# Patient Record
Sex: Female | Born: 1980 | Race: Black or African American | Hispanic: No | Marital: Single | Smoking: Never smoker
Health system: Southern US, Community
[De-identification: ages and names within clinical notes are randomized; demographics above are authoritative.]

## PROBLEM LIST (undated history)

## (undated) ENCOUNTER — Inpatient Hospital Stay (HOSPITAL_COMMUNITY): Payer: Self-pay

## (undated) DIAGNOSIS — D649 Anemia, unspecified: Secondary | ICD-10-CM

## (undated) DIAGNOSIS — IMO0002 Reserved for concepts with insufficient information to code with codable children: Secondary | ICD-10-CM

## (undated) DIAGNOSIS — I10 Essential (primary) hypertension: Secondary | ICD-10-CM

## (undated) DIAGNOSIS — R87629 Unspecified abnormal cytological findings in specimens from vagina: Secondary | ICD-10-CM

## (undated) DIAGNOSIS — O139 Gestational [pregnancy-induced] hypertension without significant proteinuria, unspecified trimester: Secondary | ICD-10-CM

## (undated) DIAGNOSIS — R87619 Unspecified abnormal cytological findings in specimens from cervix uteri: Secondary | ICD-10-CM

## (undated) DIAGNOSIS — Z9289 Personal history of other medical treatment: Secondary | ICD-10-CM

## (undated) DIAGNOSIS — R51 Headache: Secondary | ICD-10-CM

## (undated) DIAGNOSIS — O210 Mild hyperemesis gravidarum: Secondary | ICD-10-CM

## (undated) HISTORY — PX: INDUCED ABORTION: SHX677

## (undated) HISTORY — PX: NO PAST SURGERIES: SHX2092

---
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
## 1999-10-26 ENCOUNTER — Emergency Department (HOSPITAL_COMMUNITY): Admission: EM | Admit: 1999-10-26 | Discharge: 1999-10-26 | Payer: Self-pay | Admitting: Internal Medicine

## 1999-12-24 ENCOUNTER — Emergency Department (HOSPITAL_COMMUNITY): Admission: EM | Admit: 1999-12-24 | Discharge: 1999-12-25 | Payer: Self-pay | Admitting: Emergency Medicine

## 2000-02-10 ENCOUNTER — Other Ambulatory Visit: Admission: RE | Admit: 2000-02-10 | Discharge: 2000-02-10 | Payer: Self-pay | Admitting: Obstetrics

## 2000-03-21 ENCOUNTER — Emergency Department (HOSPITAL_COMMUNITY): Admission: EM | Admit: 2000-03-21 | Discharge: 2000-03-21 | Payer: Self-pay | Admitting: Emergency Medicine

## 2000-11-10 ENCOUNTER — Inpatient Hospital Stay (HOSPITAL_COMMUNITY): Admission: AD | Admit: 2000-11-10 | Discharge: 2000-11-10 | Payer: Self-pay | Admitting: Obstetrics

## 2000-12-05 ENCOUNTER — Emergency Department (HOSPITAL_COMMUNITY): Admission: EM | Admit: 2000-12-05 | Discharge: 2000-12-05 | Payer: Self-pay | Admitting: Emergency Medicine

## 2001-03-14 ENCOUNTER — Emergency Department (HOSPITAL_COMMUNITY): Admission: EM | Admit: 2001-03-14 | Discharge: 2001-03-14 | Payer: Self-pay | Admitting: Emergency Medicine

## 2001-11-22 ENCOUNTER — Emergency Department (HOSPITAL_COMMUNITY): Admission: EM | Admit: 2001-11-22 | Discharge: 2001-11-22 | Payer: Self-pay | Admitting: Emergency Medicine

## 2002-02-18 ENCOUNTER — Emergency Department (HOSPITAL_COMMUNITY): Admission: EM | Admit: 2002-02-18 | Discharge: 2002-02-18 | Payer: Self-pay | Admitting: Emergency Medicine

## 2003-01-26 ENCOUNTER — Emergency Department (HOSPITAL_COMMUNITY): Admission: EM | Admit: 2003-01-26 | Discharge: 2003-01-26 | Payer: Self-pay | Admitting: Emergency Medicine

## 2003-01-26 ENCOUNTER — Encounter: Payer: Self-pay | Admitting: Emergency Medicine

## 2003-03-23 ENCOUNTER — Emergency Department (HOSPITAL_COMMUNITY): Admission: EM | Admit: 2003-03-23 | Discharge: 2003-03-23 | Payer: Self-pay

## 2003-07-08 ENCOUNTER — Inpatient Hospital Stay (HOSPITAL_COMMUNITY): Admission: AD | Admit: 2003-07-08 | Discharge: 2003-07-08 | Payer: Self-pay | Admitting: Obstetrics

## 2003-10-14 ENCOUNTER — Emergency Department (HOSPITAL_COMMUNITY): Admission: EM | Admit: 2003-10-14 | Discharge: 2003-10-14 | Payer: Self-pay

## 2004-01-13 ENCOUNTER — Emergency Department (HOSPITAL_COMMUNITY): Admission: EM | Admit: 2004-01-13 | Discharge: 2004-01-13 | Payer: Self-pay | Admitting: Emergency Medicine

## 2005-12-10 ENCOUNTER — Inpatient Hospital Stay (HOSPITAL_COMMUNITY): Admission: AD | Admit: 2005-12-10 | Discharge: 2005-12-10 | Payer: Self-pay | Admitting: Obstetrics

## 2006-01-10 ENCOUNTER — Inpatient Hospital Stay (HOSPITAL_COMMUNITY): Admission: AD | Admit: 2006-01-10 | Discharge: 2006-01-11 | Payer: Self-pay | Admitting: Obstetrics

## 2006-03-03 ENCOUNTER — Inpatient Hospital Stay (HOSPITAL_COMMUNITY): Admission: AD | Admit: 2006-03-03 | Discharge: 2006-03-03 | Payer: Self-pay | Admitting: Obstetrics

## 2006-04-11 ENCOUNTER — Inpatient Hospital Stay (HOSPITAL_COMMUNITY): Admission: AD | Admit: 2006-04-11 | Discharge: 2006-04-11 | Payer: Self-pay | Admitting: Obstetrics

## 2006-06-16 ENCOUNTER — Inpatient Hospital Stay (HOSPITAL_COMMUNITY): Admission: AD | Admit: 2006-06-16 | Discharge: 2006-06-16 | Payer: Self-pay | Admitting: Obstetrics

## 2006-07-16 ENCOUNTER — Inpatient Hospital Stay (HOSPITAL_COMMUNITY): Admission: AD | Admit: 2006-07-16 | Discharge: 2006-07-16 | Payer: Self-pay | Admitting: Obstetrics

## 2006-07-18 ENCOUNTER — Inpatient Hospital Stay (HOSPITAL_COMMUNITY): Admission: AD | Admit: 2006-07-18 | Discharge: 2006-07-18 | Payer: Self-pay | Admitting: Obstetrics

## 2006-07-20 ENCOUNTER — Inpatient Hospital Stay (HOSPITAL_COMMUNITY): Admission: AD | Admit: 2006-07-20 | Discharge: 2006-07-20 | Payer: Self-pay | Admitting: Obstetrics

## 2006-07-22 ENCOUNTER — Inpatient Hospital Stay (HOSPITAL_COMMUNITY): Admission: AD | Admit: 2006-07-22 | Discharge: 2006-07-22 | Payer: Self-pay | Admitting: Obstetrics

## 2006-07-23 ENCOUNTER — Inpatient Hospital Stay (HOSPITAL_COMMUNITY): Admission: AD | Admit: 2006-07-23 | Discharge: 2006-07-23 | Payer: Self-pay | Admitting: Obstetrics

## 2006-07-24 ENCOUNTER — Inpatient Hospital Stay (HOSPITAL_COMMUNITY): Admission: AD | Admit: 2006-07-24 | Discharge: 2006-07-26 | Payer: Self-pay | Admitting: Obstetrics

## 2007-04-29 ENCOUNTER — Inpatient Hospital Stay (HOSPITAL_COMMUNITY): Admission: AD | Admit: 2007-04-29 | Discharge: 2007-04-29 | Payer: Self-pay | Admitting: Obstetrics

## 2007-08-20 ENCOUNTER — Inpatient Hospital Stay (HOSPITAL_COMMUNITY): Admission: AD | Admit: 2007-08-20 | Discharge: 2007-08-20 | Payer: Self-pay | Admitting: Obstetrics and Gynecology

## 2010-09-29 ENCOUNTER — Emergency Department (HOSPITAL_COMMUNITY)
Admission: EM | Admit: 2010-09-29 | Discharge: 2010-09-29 | Payer: Self-pay | Source: Home / Self Care | Admitting: Family Medicine

## 2011-02-25 ENCOUNTER — Inpatient Hospital Stay (HOSPITAL_COMMUNITY): Payer: Self-pay

## 2011-02-25 ENCOUNTER — Encounter (HOSPITAL_COMMUNITY): Payer: Self-pay | Admitting: Radiology

## 2011-02-25 ENCOUNTER — Inpatient Hospital Stay (HOSPITAL_COMMUNITY)
Admission: EM | Admit: 2011-02-25 | Discharge: 2011-02-25 | Disposition: A | Discharge status: Home / Self Care | Payer: Self-pay | Source: Ambulatory Visit | Attending: Obstetrics & Gynecology | Admitting: Obstetrics & Gynecology

## 2011-02-25 DIAGNOSIS — R109 Unspecified abdominal pain: Secondary | ICD-10-CM

## 2011-02-25 DIAGNOSIS — A5901 Trichomonal vulvovaginitis: Secondary | ICD-10-CM

## 2011-02-25 DIAGNOSIS — O98819 Other maternal infectious and parasitic diseases complicating pregnancy, unspecified trimester: Secondary | ICD-10-CM

## 2011-02-25 LAB — URINE MICROSCOPIC-ADD ON

## 2011-02-25 LAB — URINALYSIS, ROUTINE W REFLEX MICROSCOPIC
Bilirubin Urine: NEGATIVE
Glucose, UA: NEGATIVE mg/dL
Nitrite: NEGATIVE
Specific Gravity, Urine: 1.015 (ref 1.005–1.030)
pH: 8.5 — ABNORMAL HIGH (ref 5.0–8.0)

## 2011-02-25 LAB — WET PREP, GENITAL

## 2011-02-25 LAB — POCT PREGNANCY, URINE: Preg Test, Ur: POSITIVE

## 2011-04-07 ENCOUNTER — Inpatient Hospital Stay (HOSPITAL_COMMUNITY)
Admission: AD | Admit: 2011-04-07 | Discharge: 2011-04-07 | Disposition: A | Discharge status: Home / Self Care | Payer: Self-pay | Source: Ambulatory Visit | Attending: Obstetrics & Gynecology | Admitting: Obstetrics & Gynecology

## 2011-04-07 ENCOUNTER — Inpatient Hospital Stay (HOSPITAL_COMMUNITY): Admission: AD | Admit: 2011-04-07 | Payer: Self-pay | Source: Ambulatory Visit | Admitting: Obstetrics & Gynecology

## 2011-04-07 ENCOUNTER — Inpatient Hospital Stay (HOSPITAL_COMMUNITY): Payer: Self-pay

## 2011-04-07 DIAGNOSIS — A5901 Trichomonal vulvovaginitis: Secondary | ICD-10-CM | POA: Insufficient documentation

## 2011-04-07 DIAGNOSIS — R109 Unspecified abdominal pain: Secondary | ICD-10-CM

## 2011-04-07 LAB — WET PREP, GENITAL: Yeast Wet Prep HPF POC: NONE SEEN

## 2011-04-07 LAB — URINE MICROSCOPIC-ADD ON

## 2011-04-07 LAB — CBC
Hemoglobin: 12.2 g/dL (ref 12.0–15.0)
MCH: 26.9 pg (ref 26.0–34.0)
MCV: 82.3 fL (ref 78.0–100.0)
RBC: 4.53 MIL/uL (ref 3.87–5.11)

## 2011-04-07 LAB — URINALYSIS, ROUTINE W REFLEX MICROSCOPIC
Bilirubin Urine: NEGATIVE
Glucose, UA: NEGATIVE mg/dL
Specific Gravity, Urine: >1.03 — ABNORMAL HIGH (ref 1.005–1.030)
pH: 5.5 (ref 5.0–8.0)

## 2011-04-09 LAB — URINE CULTURE: Colony Count: 2000

## 2011-04-11 LAB — GC/CHLAMYDIA PROBE AMP, GENITAL
Chlamydia, DNA Probe: NEGATIVE
GC Probe Amp, Genital: NEGATIVE

## 2011-07-12 LAB — WET PREP, GENITAL

## 2011-07-12 LAB — URINALYSIS, ROUTINE W REFLEX MICROSCOPIC
Glucose, UA: NEGATIVE
Hgb urine dipstick: NEGATIVE
Protein, ur: NEGATIVE

## 2011-07-12 LAB — GC/CHLAMYDIA PROBE AMP, GENITAL: GC Probe Amp, Genital: NEGATIVE

## 2011-07-18 LAB — URINALYSIS, ROUTINE W REFLEX MICROSCOPIC
Bilirubin Urine: NEGATIVE
Hgb urine dipstick: NEGATIVE
Ketones, ur: NEGATIVE
Protein, ur: NEGATIVE
Urobilinogen, UA: 0.2

## 2011-07-18 LAB — POCT PREGNANCY, URINE: Preg Test, Ur: NEGATIVE

## 2011-07-18 LAB — WET PREP, GENITAL
Clue Cells Wet Prep HPF POC: NONE SEEN
Trich, Wet Prep: NONE SEEN
Yeast Wet Prep HPF POC: NONE SEEN

## 2011-09-29 ENCOUNTER — Emergency Department (HOSPITAL_COMMUNITY)
Admission: EM | Admit: 2011-09-29 | Discharge: 2011-09-29 | Disposition: A | Discharge status: Home / Self Care | Payer: Self-pay | Source: Home / Self Care | Attending: Family Medicine | Admitting: Family Medicine

## 2011-09-29 ENCOUNTER — Encounter (HOSPITAL_COMMUNITY): Payer: Self-pay | Admitting: *Deleted

## 2011-09-29 DIAGNOSIS — J020 Streptococcal pharyngitis: Secondary | ICD-10-CM

## 2011-09-29 LAB — POCT RAPID STREP A: Streptococcus, Group A Screen (Direct): POSITIVE — AB

## 2011-09-29 MED ORDER — CEFDINIR 300 MG PO CAPS: 300.0000 mg | ORAL_CAPSULE | Freq: Two times a day (BID) | ORAL | Status: AC
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
## 2011-09-29 NOTE — ED Provider Notes (Signed)
History     CSN: [id number]  Arrival date & time 09/29/11  1191   First MD Initiated Contact with Patient 09/29/11 1820      Chief Complaint  Patient presents with  . Sore Throat    (Consider location/radiation/quality/duration/timing/severity/associated sxs/prior treatment) Patient is a 30 y.o. female presenting with pharyngitis. The history is provided by the patient.  Sore Throat This is a new problem. The current episode started yesterday. The problem occurs constantly. The problem has been gradually worsening. Pertinent negatives include no chest pain, no abdominal pain, no headaches and no shortness of breath. The symptoms are aggravated by nothing. The symptoms are relieved by nothing.    History reviewed. No pertinent past medical history.  History reviewed. No pertinent past surgical history.  History reviewed. No pertinent family history.  History  Substance Use Topics  . Smoking status: Never Smoker   . Smokeless tobacco: Not on file  . Alcohol Use: Yes    OB History    Grav Para Term Preterm Abortions TAB SAB Ect Mult Living   1               Review of Systems  Constitutional: Positive for fever and chills.  HENT: Positive for sore throat.   Respiratory: Negative for shortness of breath.   Cardiovascular: Negative for chest pain.  Gastrointestinal: Negative for abdominal pain.  Skin: Negative.   Neurological: Negative for headaches.    Allergies  Review of patient's allergies indicates no known allergies.  Home Medications   Current Outpatient Rx  Name Route Sig Dispense Refill  . NYQUIL PO Oral Take by mouth.      . CEFDINIR 300 MG PO CAPS Oral Take 1 capsule (300 mg total) by mouth 2 (two) times daily. 20 capsule 0    BP 132/80  Pulse 82  Temp(Src) 99.2 F (37.3 C) (Oral)  Resp 16  SpO2 100%  LMP 09/29/2011  Breastfeeding? Unknown  Physical Exam  Constitutional: She appears well-developed and well-nourished.  HENT:  Head:  Normocephalic.  Right Ear: External ear normal.  Left Ear: External ear normal.  Mouth/Throat: Oropharyngeal exudate and posterior oropharyngeal erythema present.  Eyes: Pupils are equal, round, and reactive to light.  Neck: Normal range of motion. Neck supple.  Lymphadenopathy:    She has cervical adenopathy.    ED Course  Procedures (including critical care time)  Labs Reviewed  POCT RAPID STREP A ([facility] ONLY) - Abnormal; Notable for the following:    Streptococcus, Group A Screen (Direct) POSITIVE (*)    All other components within normal limits   No results found.   1. Streptococcal sore throat       MDM  Strep pos.        Barkley Bruns, MD 09/29/11 2030

## 2012-07-10 ENCOUNTER — Encounter (HOSPITAL_COMMUNITY): Payer: Self-pay | Admitting: Emergency Medicine

## 2012-07-10 ENCOUNTER — Emergency Department (HOSPITAL_COMMUNITY)
Admission: EM | Admit: 2012-07-10 | Discharge: 2012-07-10 | Disposition: A | Discharge status: Home / Self Care | Payer: No Typology Code available for payment source | Attending: Emergency Medicine | Admitting: Emergency Medicine

## 2012-07-10 DIAGNOSIS — Z91040 Latex allergy status: Secondary | ICD-10-CM | POA: Insufficient documentation

## 2012-07-10 DIAGNOSIS — I10 Essential (primary) hypertension: Secondary | ICD-10-CM | POA: Insufficient documentation

## 2012-07-10 DIAGNOSIS — Y92410 Unspecified street and highway as the place of occurrence of the external cause: Secondary | ICD-10-CM | POA: Insufficient documentation

## 2012-07-10 DIAGNOSIS — S139XXA Sprain of joints and ligaments of unspecified parts of neck, initial encounter: Secondary | ICD-10-CM | POA: Insufficient documentation

## 2012-07-10 DIAGNOSIS — S134XXA Sprain of ligaments of cervical spine, initial encounter: Secondary | ICD-10-CM

## 2012-07-10 MED ORDER — IBUPROFEN 800 MG PO TABS: 800.0000 mg | ORAL_TABLET | Freq: Three times a day (TID) | ORAL | Status: DC
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
## 2012-07-11 NOTE — ED Provider Notes (Signed)
Medical screening examination/treatment/procedure(s) were performed by non-physician practitioner and as supervising physician I was immediately available for consultation/collaboration.  Jones Skene, M.D.     Jones Skene, MD 07/11/12 1350

## 2012-09-12 ENCOUNTER — Inpatient Hospital Stay (HOSPITAL_COMMUNITY): Payer: Self-pay

## 2012-09-12 ENCOUNTER — Inpatient Hospital Stay (HOSPITAL_COMMUNITY)
Admission: AD | Admit: 2012-09-12 | Discharge: 2012-09-12 | Disposition: A | Discharge status: Home / Self Care | Payer: Self-pay | Source: Ambulatory Visit | Attending: Obstetrics & Gynecology | Admitting: Obstetrics & Gynecology

## 2012-09-12 ENCOUNTER — Encounter (HOSPITAL_COMMUNITY): Payer: Self-pay | Admitting: *Deleted

## 2012-09-12 DIAGNOSIS — H6122 Impacted cerumen, left ear: Secondary | ICD-10-CM

## 2012-09-12 DIAGNOSIS — N912 Amenorrhea, unspecified: Secondary | ICD-10-CM | POA: Insufficient documentation

## 2012-09-12 DIAGNOSIS — Z3202 Encounter for pregnancy test, result negative: Secondary | ICD-10-CM | POA: Insufficient documentation

## 2012-09-12 DIAGNOSIS — N926 Irregular menstruation, unspecified: Secondary | ICD-10-CM

## 2012-09-12 DIAGNOSIS — R51 Headache: Secondary | ICD-10-CM | POA: Insufficient documentation

## 2012-09-12 DIAGNOSIS — R109 Unspecified abdominal pain: Secondary | ICD-10-CM | POA: Insufficient documentation

## 2012-09-12 HISTORY — DX: Headache: R51

## 2012-09-12 HISTORY — DX: Anemia, unspecified: D64.9

## 2012-09-12 HISTORY — DX: Gestational (pregnancy-induced) hypertension without significant proteinuria, unspecified trimester: O13.9

## 2012-09-12 LAB — URINALYSIS, ROUTINE W REFLEX MICROSCOPIC
Leukocytes, UA: NEGATIVE
Nitrite: NEGATIVE
Protein, ur: NEGATIVE mg/dL
Urobilinogen, UA: 0.2 mg/dL (ref 0.0–1.0)

## 2012-09-12 LAB — WET PREP, GENITAL
Trich, Wet Prep: NONE SEEN
Yeast Wet Prep HPF POC: NONE SEEN

## 2012-09-12 LAB — POCT PREGNANCY, URINE: Preg Test, Ur: NEGATIVE

## 2012-09-12 LAB — CBC
HCT: 39.8 % (ref 36.0–46.0)
MCH: 28.1 pg (ref 26.0–34.0)
MCHC: 33.4 g/dL (ref 30.0–36.0)
RDW: 13 % (ref 11.5–15.5)

## 2012-09-12 LAB — HCG, QUANTITATIVE, PREGNANCY: hCG, Beta Chain, Quant, S: <1 m[IU]/mL (ref <5)

## 2012-09-12 NOTE — MAU Note (Signed)
C/o nausea and feeling tired; had some ringing in her ear and dizziness also;

## 2012-09-12 NOTE — MAU Note (Signed)
Started a new sexual relationship about "8 months";

## 2012-09-12 NOTE — MAU Provider Note (Signed)
Attestation of Attending Supervision of Advanced Practitioner (PA/CNM/NP): Evaluation and management procedures were performed by the Advanced Practitioner under my supervision and collaboration.  I have reviewed the Advanced Practitioner's note and chart, and I agree with the management and plan.  Toa Mia, MD, FACOG Attending Obstetrician & Gynecologist Faculty Practice, [facility]  

## 2012-09-12 NOTE — MAU Provider Note (Signed)
History     CSN: [id number]  Arrival date and time: 09/12/12 0907   First Provider Initiated Contact with Patient 09/12/12 [phone]      Chief Complaint  Patient presents with  . Abdominal Pain   HPI Kathy Dean 31 y.o. Comes to MAU with a number of complaints - mainly lower abdominal pain intermittently x 2 months, missed menstrual cycle, headaches, nausea, dizziness, ringing in her ears and sleeping a lot.  Denies nasal and head congestion.  Has not taken any pain medication for her headaches or abdominal pain.  States she does not take medication.  Menstrual cramps are ususally 10/10 and she does not take medication.  Her abdominal pain in the past 2 months has been 7/10 when it occurs.  Using condoms for contraception  - inconsistent with use.  Currently has a headache today - rates it at 4/10.  No worse than usual.  Declines any pain medication for headache.  Reports she drinks lots of water daily.  OB History    Grav Para Term Preterm Abortions TAB SAB Ect Mult Living   6 3  3 2 2    3       Past Medical History  Diagnosis Date  . Hypertension   . Anemia   . Pregnancy induced hypertension   . Headache     History reviewed. No pertinent past surgical history.  Family History  Problem Relation Age of Onset  . Other Neg Hx     History  Substance Use Topics  . Smoking status: Never Smoker   . Smokeless tobacco: Never Used  . Alcohol Use: Yes     Comment: socially    Allergies:  Allergies  Allergen Reactions  . Latex Swelling and Dermatitis    No prescriptions prior to admission    Review of Systems  Constitutional: Negative for fever.  HENT: Negative for ear pain, congestion and ear discharge.        Ringing in ears.  Respiratory: Negative for cough.   Gastrointestinal: Positive for abdominal pain. Negative for nausea, vomiting, diarrhea and constipation.       C/O RLQ tenderness  Genitourinary:       No vaginal discharge. No vaginal bleeding. No  dysuria.  Neurological: Positive for dizziness and headaches.   Physical Exam   Blood pressure 136/82, pulse 79, temperature 97.8 F (36.6 C), temperature source Oral, resp. rate 16, height 5\' 2"  (1.575 m), weight 54.432 kg (120 lb), last menstrual period 08/02/2012.  Physical Exam  Nursing note and vitals reviewed. Constitutional: She is oriented to person, place, and time. She appears well-developed and well-nourished.  HENT:  Head: Normocephalic.       Ears - TM on right  - good light reflex, no redness, no fullness, TM on left not visualized as ear canal is completely blocked with dark wax.  Eyes: EOM are normal.  Neck: Neck supple.  GI: Soft. There is no tenderness.  Genitourinary:       Speculum exam: Vagina - Small amount of creamy discharge, no odor, minimal brown mucus coming from cervix, small fissure noted at 6 oclock on introitus (states she felt a tear last night during sex) Cervix - No active bleeding Bimanual exam: Cervix closed Uterus non tender, normal size Adnexa non tender, no masses bilaterally GC/Chlam, wet prep done Chaperone present for exam.  Musculoskeletal: Normal range of motion.  Neurological: She is alert and oriented to person, place, and time.  Skin: Skin is warm and  dry.  Psychiatric: She has a normal mood and affect.    MAU Course  Procedures Results for orders placed during the hospital encounter of 09/12/12 (from the past 24 hour(s))  URINALYSIS, ROUTINE W REFLEX MICROSCOPIC     Status: Abnormal   Collection Time   09/12/12  9:14 AM      Component Value Range   Color, Urine YELLOW  YELLOW   APPearance CLEAR  CLEAR   Specific Gravity, Urine >1.030 (*) 1.005 - 1.030   pH 6.0  5.0 - 8.0   Glucose, UA NEGATIVE  NEGATIVE mg/dL   Hgb urine dipstick TRACE (*) NEGATIVE   Bilirubin Urine NEGATIVE  NEGATIVE   Ketones, ur NEGATIVE  NEGATIVE mg/dL   Protein, ur NEGATIVE  NEGATIVE mg/dL   Urobilinogen, UA 0.2  0.0 - 1.0 mg/dL   Nitrite NEGATIVE   NEGATIVE   Leukocytes, UA NEGATIVE  NEGATIVE  URINE MICROSCOPIC-ADD ON     Status: Abnormal   Collection Time   09/12/12  9:14 AM      Component Value Range   Squamous Epithelial / LPF FEW (*) RARE   WBC, UA 0-2  <3 WBC/hpf   RBC / HPF 3-6  <3 RBC/hpf   Urine-Other MUCOUS PRESENT    POCT PREGNANCY, URINE     Status: Normal   Collection Time   09/12/12  9:23 AM      Component Value Range   Preg Test, Ur NEGATIVE  NEGATIVE  HCG, QUANTITATIVE, PREGNANCY     Status: Normal   Collection Time   09/12/12  9:55 AM      Component Value Range   hCG, Beta Chain, Quant, S <1  <5 mIU/mL  CBC     Status: Normal   Collection Time   09/12/12  9:55 AM      Component Value Range   WBC 4.8  4.0 - 10.5 K/uL   RBC 4.74  3.87 - 5.11 MIL/uL   Hemoglobin 13.3  12.0 - 15.0 g/dL   HCT 09.8  11.9 - 14.7 %   MCV 84.0  78.0 - 100.0 fL   MCH 28.1  26.0 - 34.0 pg   MCHC 33.4  30.0 - 36.0 g/dL   RDW 82.9  56.2 - 13.0 %   Platelets 293  150 - 400 K/uL  WET PREP, GENITAL     Status: Abnormal   Collection Time   09/12/12 10:15 AM      Component Value Range   Yeast Wet Prep HPF POC NONE SEEN  NONE SEEN   Trich, Wet Prep NONE SEEN  NONE SEEN   Clue Cells Wet Prep HPF POC FEW (*) NONE SEEN   WBC, Wet Prep HPF POC MANY (*) NONE SEEN    MDM Clinical Data: Right lower quadrant pain. LMP 08/02/2012.  TRANSABDOMINAL AND TRANSVAGINAL ULTRASOUND OF PELVIS  Technique: Both transabdominal and transvaginal ultrasound  examinations of the pelvis were performed. Transabdominal  technique was performed for global imaging of the pelvis including  uterus, ovaries, adnexal regions, and pelvic cul-de-sac.  It was necessary to proceed with endovaginal exam following the  transabdominal exam to visualize the endometrium and ovaries.  Comparison: 04/07/2011  Findings:  Uterus: 10.1 x 5.7 x 7.1 cm. Retroverted. No fibroids or other  uterine mass identified.  Endometrium: Double layer thickness measures 15 mm  transvaginally.  No focal lesion visualized.  Right ovary: 2.3 x 1.5 x 2.1 cm. Normal appearance.  Left ovary: 3.9 x 2.8 x 2.8  cm. Normal appearance. 2.7 cm  follicle noted.  Other Findings: No free fluid  IMPRESSION:  Normal study. No evidence of pelvic mass or other significant  abnormality.    Assessment and Plan  Negative pregnancy test Abdominal pain  Missed menstrual cycle - unknown cause Vaginal fissure  Plan Drink at least 8 8-oz glasses of water every day. Establish with a doctor to be seen for regular care and evaluation of continuing problems. Use Debrox drops in left ear canal to clear accumulation of ear wax. Use water soluble lubricant with intercourse to avoid any vaginal tears.  No sex until the current area heals. Your blood and urine pregnancy tests are negative today.  Continue using condoms for pregnancy protection.  BURLESON,TERRI 09/12/2012, 10:20 AM

## 2012-09-12 NOTE — MAU Note (Signed)
Missed a period; c/o intermittent abdominal pain for past month;

## 2012-09-13 LAB — GC/CHLAMYDIA PROBE AMP: GC Probe RNA: NEGATIVE

## 2013-07-18 ENCOUNTER — Emergency Department (HOSPITAL_COMMUNITY)
Admission: EM | Admit: 2013-07-18 | Discharge: 2013-07-18 | Disposition: A | Discharge status: Home / Self Care | Payer: Self-pay | Attending: Emergency Medicine | Admitting: Emergency Medicine

## 2013-07-18 ENCOUNTER — Encounter (HOSPITAL_COMMUNITY): Payer: Self-pay | Admitting: Emergency Medicine

## 2013-07-18 DIAGNOSIS — S0993XA Unspecified injury of face, initial encounter: Secondary | ICD-10-CM | POA: Insufficient documentation

## 2013-07-18 DIAGNOSIS — S8990XA Unspecified injury of unspecified lower leg, initial encounter: Secondary | ICD-10-CM | POA: Insufficient documentation

## 2013-07-18 DIAGNOSIS — IMO0002 Reserved for concepts with insufficient information to code with codable children: Secondary | ICD-10-CM | POA: Insufficient documentation

## 2013-07-18 DIAGNOSIS — Y9389 Activity, other specified: Secondary | ICD-10-CM | POA: Insufficient documentation

## 2013-07-18 DIAGNOSIS — Z862 Personal history of diseases of the blood and blood-forming organs and certain disorders involving the immune mechanism: Secondary | ICD-10-CM | POA: Insufficient documentation

## 2013-07-18 DIAGNOSIS — Z91040 Latex allergy status: Secondary | ICD-10-CM | POA: Insufficient documentation

## 2013-07-18 DIAGNOSIS — Y92410 Unspecified street and highway as the place of occurrence of the external cause: Secondary | ICD-10-CM | POA: Insufficient documentation

## 2013-07-18 MED ORDER — CYCLOBENZAPRINE HCL 10 MG PO TABS
10.0000 mg | ORAL_TABLET | Freq: Once | ORAL | Status: AC
  Administered 2013-07-18: 10 mg via ORAL
  Filled 2013-07-18: qty 1

## 2013-07-18 MED ORDER — IBUPROFEN 400 MG PO TABS
600.0000 mg | ORAL_TABLET | Freq: Once | ORAL | Status: AC
Start: 2013-07-18 — End: 2013-07-18
  Administered 2013-07-18: 600 mg via ORAL
  Filled 2013-07-18 (×2): qty 1

## 2013-07-18 MED ORDER — IBUPROFEN 600 MG PO TABS: 600.0000 mg | ORAL_TABLET | Freq: Four times a day (QID) | ORAL | Status: DC | PRN

## 2013-07-18 MED ORDER — CYCLOBENZAPRINE HCL 10 MG PO TABS: 10.0000 mg | ORAL_TABLET | Freq: Two times a day (BID) | ORAL | Status: DC | PRN
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
## 2013-07-23 NOTE — ED Provider Notes (Signed)
Medical screening examination/treatment/procedure(s) were performed by non-physician practitioner and as supervising physician I was immediately available for consultation/collaboration.  Everlene Cunning Y. Tyquisha Sharps, MD 07/23/13 0043 

## 2013-08-24 ENCOUNTER — Emergency Department (HOSPITAL_COMMUNITY)
Admission: EM | Admit: 2013-08-24 | Discharge: 2013-08-24 | Disposition: A | Discharge status: Home / Self Care | Payer: Medicaid Other | Attending: Emergency Medicine | Admitting: Emergency Medicine

## 2013-08-24 ENCOUNTER — Emergency Department (HOSPITAL_COMMUNITY): Payer: Medicaid Other

## 2013-08-24 ENCOUNTER — Encounter (HOSPITAL_COMMUNITY): Payer: Self-pay | Admitting: Emergency Medicine

## 2013-08-24 DIAGNOSIS — O169 Unspecified maternal hypertension, unspecified trimester: Secondary | ICD-10-CM | POA: Insufficient documentation

## 2013-08-24 DIAGNOSIS — R1031 Right lower quadrant pain: Secondary | ICD-10-CM | POA: Insufficient documentation

## 2013-08-24 DIAGNOSIS — N898 Other specified noninflammatory disorders of vagina: Secondary | ICD-10-CM | POA: Insufficient documentation

## 2013-08-24 DIAGNOSIS — R1032 Left lower quadrant pain: Secondary | ICD-10-CM | POA: Insufficient documentation

## 2013-08-24 DIAGNOSIS — R1033 Periumbilical pain: Secondary | ICD-10-CM | POA: Insufficient documentation

## 2013-08-24 DIAGNOSIS — Z3490 Encounter for supervision of normal pregnancy, unspecified, unspecified trimester: Secondary | ICD-10-CM

## 2013-08-24 DIAGNOSIS — Z79899 Other long term (current) drug therapy: Secondary | ICD-10-CM | POA: Insufficient documentation

## 2013-08-24 DIAGNOSIS — M545 Low back pain, unspecified: Secondary | ICD-10-CM | POA: Insufficient documentation

## 2013-08-24 DIAGNOSIS — Z91040 Latex allergy status: Secondary | ICD-10-CM | POA: Insufficient documentation

## 2013-08-24 DIAGNOSIS — R110 Nausea: Secondary | ICD-10-CM | POA: Insufficient documentation

## 2013-08-24 DIAGNOSIS — Z862 Personal history of diseases of the blood and blood-forming organs and certain disorders involving the immune mechanism: Secondary | ICD-10-CM | POA: Insufficient documentation

## 2013-08-24 DIAGNOSIS — O9989 Other specified diseases and conditions complicating pregnancy, childbirth and the puerperium: Secondary | ICD-10-CM | POA: Insufficient documentation

## 2013-08-24 LAB — CBC WITH DIFFERENTIAL/PLATELET
Basophils Absolute: 0 10*3/uL (ref 0.0–0.1)
Eosinophils Absolute: 0.1 10*3/uL (ref 0.0–0.7)
Eosinophils Relative: 1 % (ref 0–5)
Lymphs Abs: 1.3 10*3/uL (ref 0.7–4.0)
MCH: 27.7 pg (ref 26.0–34.0)
Neutrophils Relative %: 74 % (ref 43–77)
Platelets: 293 10*3/uL (ref 150–400)
RBC: 4.59 MIL/uL (ref 3.87–5.11)
RDW: 13.6 % (ref 11.5–15.5)
WBC: 6.6 10*3/uL (ref 4.0–10.5)

## 2013-08-24 LAB — COMPREHENSIVE METABOLIC PANEL
ALT: 10 U/L (ref 0–35)
AST: 17 U/L (ref 0–37)
Albumin: 3.9 g/dL (ref 3.5–5.2)
Alkaline Phosphatase: 53 U/L (ref 39–117)
Calcium: 8.8 mg/dL (ref 8.4–10.5)
GFR calc Af Amer: >90 mL/min (ref >90)
Glucose, Bld: 76 mg/dL (ref 70–99)
Potassium: 3.6 mEq/L (ref 3.5–5.1)
Sodium: 138 mEq/L (ref 135–145)
Total Protein: 7 g/dL (ref 6.0–8.3)

## 2013-08-24 LAB — URINALYSIS, ROUTINE W REFLEX MICROSCOPIC
Bilirubin Urine: NEGATIVE
Glucose, UA: NEGATIVE mg/dL
Hgb urine dipstick: NEGATIVE
Specific Gravity, Urine: 1.029 (ref 1.005–1.030)
pH: 6.5 (ref 5.0–8.0)

## 2013-08-24 LAB — URINE MICROSCOPIC-ADD ON

## 2013-08-24 LAB — WET PREP, GENITAL
Trich, Wet Prep: NONE SEEN
Yeast Wet Prep HPF POC: NONE SEEN

## 2013-08-24 MED ORDER — SODIUM CHLORIDE 0.9 % IV BOLUS (SEPSIS)
1000.0000 mL | Freq: Once | INTRAVENOUS | Status: AC
  Administered 2013-08-24: 1000 mL via INTRAVENOUS

## 2013-08-24 MED ORDER — CEFTRIAXONE SODIUM 250 MG IJ SOLR
250.0000 mg | Freq: Once | INTRAMUSCULAR | Status: AC
  Administered 2013-08-24: 250 mg via INTRAMUSCULAR
  Filled 2013-08-24: qty 250

## 2013-08-24 MED ORDER — MORPHINE SULFATE 4 MG/ML IJ SOLN
4.0000 mg | Freq: Once | INTRAMUSCULAR | Status: AC
  Administered 2013-08-24: 4 mg via INTRAVENOUS
  Filled 2013-08-24: qty 1

## 2013-08-24 MED ORDER — AZITHROMYCIN 250 MG PO TABS
1000.0000 mg | ORAL_TABLET | Freq: Once | ORAL | Status: AC
  Administered 2013-08-24: 1000 mg via ORAL
  Filled 2013-08-24: qty 4

## 2013-08-24 MED ORDER — ONDANSETRON HCL 4 MG/2ML IJ SOLN
4.0000 mg | Freq: Once | INTRAMUSCULAR | Status: AC
  Administered 2013-08-24: 4 mg via INTRAVENOUS
  Filled 2013-08-24: qty 2

## 2013-08-24 NOTE — ED Notes (Signed)
PA and Tech at bedside to perform pelvic exam.

## 2013-08-24 NOTE — ED Provider Notes (Signed)
CSN: [id number]     Arrival date & time 08/24/13  3086 History   First MD Initiated Contact with Patient 08/24/13 [id number]     Chief Complaint  Patient presents with  . Abdominal Pain  . Back Pain   (Consider location/radiation/quality/duration/timing/severity/associated sxs/prior Treatment) HPI  32 year old female with history of hypertension and anemia presents complaining of abdominal pain and back pain. Patient reports having low trouble pain and low back pain ongoing for about 4 or 5 days. The pain is described as a sharp and throbbing sensation, worsening with movement, and sometimes improves with voiding. Pain in her back has been constant pain abdomen has been intermittent. This morning abdominal pain with severe waking her up in the middle of the night. She endorsed nausea without vomiting. Endorse loose stools without blood or mucus. She tries drinking fluid and rest with minimal relief. She is currently rating her pain as a 6/10. She reports having to do a lot of standing at work and I'm sure if this is related to it. She has decrease in appetite but continued to drink plenty of fluid. She denies fever, chills, headache, chest pain, shortness of breath, productive cough, burning urination, vaginal bleeding, hematuria, hematochezia or melena. She did report having some mild vaginal discharge which she thought it is the infection has been taking over-the-counter yeast medication with minimal relief. She denies any pain with sexual activity. No prior abdominal surgery.  Past Medical History  Diagnosis Date  . Hypertension   . Anemia   . Pregnancy induced hypertension   . Headache(784.0)    History reviewed. No pertinent past surgical history. Family History  Problem Relation Age of Onset  . Other Neg Hx    History  Substance Use Topics  . Smoking status: Never Smoker   . Smokeless tobacco: Never Used  . Alcohol Use: Yes     Comment: socially   OB History   Grav Para Term Preterm  Abortions TAB SAB Ect Mult Living   6 3  3 2 2    3      Review of Systems  All other systems reviewed and are negative.    Allergies  Latex  Home Medications   Current Outpatient Rx  Name  Route  Sig  Dispense  Refill  . PRESCRIPTION MEDICATION   Oral   Take 1 tablet by mouth daily. Birth control          LMP 07/26/2013 Physical Exam  Nursing note and vitals reviewed. Constitutional: She appears well-developed and well-nourished. No distress.  HENT:  Head: Normocephalic and atraumatic.  Eyes: Conjunctivae are normal.  Neck: Normal range of motion. Neck supple.  Cardiovascular: Normal rate and regular rhythm.   Pulmonary/Chest: Effort normal and breath sounds normal. She exhibits no tenderness.  Abdominal: Soft. There is no tenderness (Patient with periumbilical, left lower and right lower quadrant tenderness on palpation without guarding or rebound tenderness, no hernia noted, abdomen is otherwise soft with normal bowel sounds. No psoas or obturator sign, no Murphy sign ).  Genitourinary: Vagina normal and uterus normal. There is no rash or lesion on the right labia. There is no rash or lesion on the left labia. Cervix exhibits no motion tenderness and no discharge. Right adnexum displays tenderness. Right adnexum displays no mass. Left adnexum displays tenderness. Left adnexum displays no mass. No erythema, tenderness or bleeding around the vagina. No vaginal discharge found.  Chaperone present:  Lymphadenopathy:       Right: No inguinal adenopathy  present.       Left: No inguinal adenopathy present.    ED Course  Procedures (including critical care time)  6:28 AM Patient with low abnormal pain and low back pain. Workup initiated. Patient otherwise appearing to be nontoxic with stable normal vital sign.  7:34 AM Pregnancy test is positive. Patient does have left and right adnexa tenderness and mild cervical motion tenderness. She has some abnormal soap growths noted  on the cervical os at the 3 and 6:00 o'clock position. She does have prior history of STDs including gonorrhea Chlamydia. We'll give Rocephin and Zithromax here. Additional workup needed to rule out ectopic pregnancy.  9:44 AM Transvaginal US without evidence of intrauterine or extrauterine gestational sac.  Some findings to suggest a resolving corpus luteal cysts involving the right ovary.  Pt currently comfortable laying in bed.  Pt made aware of result.  Care discussed with attending.  Will also consult OB for close outpt f/u for further evaluation.    10:04 AM I have consulted with OB, Dr. Lynetta Mare, who has reviewed the [place] result and does not see evidence of ectopic pregnancy.  At this time, i also have low suspicion for appendicitis.  Her pain may be related to her resolving corpus luteal cysts.  Pt will f/u closely at [facility]U in 2 days for recheck of quant.    Labs Review Labs Reviewed  WET PREP, GENITAL - Abnormal; Notable for the following:    Clue Cells Wet Prep HPF POC FEW (*)    WBC, Wet Prep HPF POC FEW (*)    All other components within normal limits  URINALYSIS, ROUTINE W REFLEX MICROSCOPIC - Abnormal; Notable for the following:    APPearance CLOUDY (*)    Ketones, ur 15 (*)    Leukocytes, UA SMALL (*)    All other components within normal limits  HCG, QUANTITATIVE, PREGNANCY - Abnormal; Notable for the following:    hCG, Beta Chain, Quant, S 533 (*)    All other components within normal limits  URINE MICROSCOPIC-ADD ON - Abnormal; Notable for the following:    Bacteria, UA FEW (*)    All other components within normal limits  POCT PREGNANCY, URINE - Abnormal; Notable for the following:    Preg Test, Ur POSITIVE (*)    All other components within normal limits  GC/CHLAMYDIA PROBE AMP  URINE CULTURE  CBC WITH DIFFERENTIAL  COMPREHENSIVE METABOLIC PANEL  LIPASE, BLOOD   Imaging Review US Ob Comp Less 14 Wks  08/24/2013   CLINICAL DATA:  Pelvic pain, evaluate  for possible ectopic  EXAM: OBSTETRIC <14 WK [place] AND TRANSVAGINAL OB US  TECHNIQUE: Both transabdominal and transvaginal ultrasound examinations were performed for complete evaluation of the gestation as well as the maternal uterus, adnexal regions, and pelvic cul-de-sac. Transvaginal technique was performed to assess early pregnancy.  COMPARISON:  None.  FINDINGS: Intrauterine gestational sac: Not appreciated.  Yolk sac:  N/A  Embryo:  N/A  Cardiac Activity: N/A  Heart Rate: N/A bpm  MSD: N/A mm    w     d  CRL:   N/A  mm    w  d                  [place] EDC:  Maternal uterus/adnexae: The uterus is retroverted. The endometrium is thickened no otherwise unremarkable. Measuring 1.9 cm in thickness.  The adnexal regions demonstrate complex cystic area within the right ovary measuring 2.1 x 2 x  1.9 cm is may represent a resolving corpus luteal cyst.  A small amount of fluid is identified within the cul-de-sac.  IMPRESSION: 1. No evidence of an intrauterine or extrauterine gestational sac 2. Findings likely representing a resolving corpus luteal cysts involving the right ovary 3. Small amount of fluid within the cul sac without further evidence of sonographic abnormalities.   Electronically Signed   By: Salome Holmes M.D.   On: 08/24/2013 09:27   US Ob Transvaginal  08/24/2013   CLINICAL DATA:  Pelvic pain, evaluate for possible ectopic  EXAM: OBSTETRIC <14 WK [place] AND TRANSVAGINAL OB US  TECHNIQUE: Both transabdominal and transvaginal ultrasound examinations were performed for complete evaluation of the gestation as well as the maternal uterus, adnexal regions, and pelvic cul-de-sac. Transvaginal technique was performed to assess early pregnancy.  COMPARISON:  None.  FINDINGS: Intrauterine gestational sac: Not appreciated.  Yolk sac:  N/A  Embryo:  N/A  Cardiac Activity: N/A  Heart Rate: N/A bpm  MSD: N/A mm    w     d  CRL:   N/A  mm    w  d                  [place] EDC:  Maternal uterus/adnexae: The uterus is retroverted.  The endometrium is thickened no otherwise unremarkable. Measuring 1.9 cm in thickness.  The adnexal regions demonstrate complex cystic area within the right ovary measuring 2.1 x 2 x 1.9 cm is may represent a resolving corpus luteal cyst.  A small amount of fluid is identified within the cul-de-sac.  IMPRESSION: 1. No evidence of an intrauterine or extrauterine gestational sac 2. Findings likely representing a resolving corpus luteal cysts involving the right ovary 3. Small amount of fluid within the cul sac without further evidence of sonographic abnormalities.   Electronically Signed   By: Salome Holmes M.D.   On: 08/24/2013 09:27    EKG Interpretation   None       MDM   1. Lower abdominal pain, unspecified laterality   2. Pregnancy    BP 117/82  Pulse 68  Temp(Src) 98.5 F (36.9 C) (Oral)  Resp 16  SpO2 100%  LMP 07/26/2013  I have reviewed nursing notes and vital signs. I personally reviewed the imaging tests through PACS system  I reviewed available ER/hospitalization records thought the EMR     Fayrene Helper, PA-C 08/24/13 1010

## 2013-08-24 NOTE — ED Notes (Signed)
Patient awoke this AM with abdominal pain, stated that it felt like she needed to void badly. Initially had difficulty getting out of bed to get to restroom, but was able to void after getting up. States that She has bee experiencing back pain for a few days, but didn't think it was a problem as she works on her feet all day. Back pain was exaggerated upon waking this AM and was somewhat relieved after voiding.

## 2013-08-24 NOTE — ED Notes (Signed)
Pt returned from radiology.

## 2013-08-26 LAB — URINE CULTURE: Colony Count: >=100000

## 2013-08-27 LAB — GC/CHLAMYDIA PROBE AMP
CT Probe RNA: NEGATIVE
GC Probe RNA: NEGATIVE

## 2013-08-27 NOTE — ED Notes (Signed)
Post ED Visit - Positive Culture Follow-up: Successful Patient Follow-Up  Culture assessed and recommendations reviewed by: [x]  Wes Dulaney, Pharm.D., BCPS []  Celedonio Miyamoto, Pharm.D., BCPS []  Georgina Pillion, [address].D., BCPS []  Morrison, [address].D., BCPS, AAHIVP []  Estella Husk, Pharm.D., BCPS, AAHIVP 31yof presented with abdominal and back pain. Patient was found to be pregnant (estimated ~2-3 weeks). Patient received Azithromycin 1g and Ceftriaxone 250mg  in ED for possible STD but was not discharged on antibiotics. Discussed patient with ED PA - PA Szekalski wishes to treat patient for possible enterococcal UTI.  New antibiotic prescription: Amoxicillin 500mg  PO BID x 7 days  ED Provider: Emilia Beck, PA-C   Contacted patient No answer  ,Larena Sox 08/27/2013, 5:07 PM

## 2013-08-27 NOTE — Progress Notes (Signed)
  ED Antimicrobial Stewardship Positive Culture Follow Up   Kathy Dean is an 32 y.o. female who presented to [facility] on 08/24/2013 with a chief complaint of  Chief Complaint  Patient presents with  . Abdominal Pain  . Back Pain    Recent Results (from the past 720 hour(s))  URINE CULTURE     Status: None   Collection Time    08/24/13  7:15 AM      Result Value Range Status   Specimen Description URINE, RANDOM   Final   Special Requests NONE   Final   Culture  Setup Time     Final   Value: 08/24/2013 17:40     Performed at [facility] Count     Final   Value: >=100,000 COLONIES/ML     Performed at [facility]   Culture     Final   Value: ENTEROCOCCUS SPECIES     Performed at [facility]   Report Status 08/26/2013 FINAL   Final   Organism ID, Bacteria ENTEROCOCCUS SPECIES   Final    31yof presented with abdominal and back pain. Patient was found to be pregnant (estimated ~2-3 weeks). Patient received Azithromycin 1g and Ceftriaxone 250mg  in ED for possible STD but was not discharged on antibiotics. Discussed patient with ED PA - PA Szekalski wishes to treat patient for possible enterococcal UTI.   New antibiotic prescription: Amoxicillin 500mg  PO BID x 7 days  ED Provider: Emilia Beck, PA-C   Cleon Dew 08/27/2013, 10:59 AM Infectious Diseases Pharmacist Phone# [phone]

## 2013-08-28 NOTE — ED Provider Notes (Signed)
Medical screening examination/treatment/procedure(s) were performed by non-physician practitioner and as supervising physician I was immediately available for consultation/collaboration.   Loren Racer, MD 08/28/13 [phone]

## 2013-08-30 ENCOUNTER — Encounter (HOSPITAL_COMMUNITY): Payer: Self-pay

## 2013-08-30 ENCOUNTER — Inpatient Hospital Stay (HOSPITAL_COMMUNITY)
Admission: AD | Admit: 2013-08-30 | Discharge: 2013-08-30 | Disposition: A | Discharge status: Home / Self Care | Payer: Medicaid Other | Source: Ambulatory Visit | Attending: Obstetrics and Gynecology | Admitting: Obstetrics and Gynecology

## 2013-08-30 ENCOUNTER — Inpatient Hospital Stay (HOSPITAL_COMMUNITY): Payer: Medicaid Other

## 2013-08-30 DIAGNOSIS — Z1389 Encounter for screening for other disorder: Secondary | ICD-10-CM

## 2013-08-30 DIAGNOSIS — R109 Unspecified abdominal pain: Secondary | ICD-10-CM | POA: Insufficient documentation

## 2013-08-30 DIAGNOSIS — Z3490 Encounter for supervision of normal pregnancy, unspecified, unspecified trimester: Secondary | ICD-10-CM

## 2013-08-30 DIAGNOSIS — O2341 Unspecified infection of urinary tract in pregnancy, first trimester: Secondary | ICD-10-CM

## 2013-08-30 DIAGNOSIS — O2390 Unspecified genitourinary tract infection in pregnancy, unspecified trimester: Secondary | ICD-10-CM | POA: Insufficient documentation

## 2013-08-30 DIAGNOSIS — N390 Urinary tract infection, site not specified: Secondary | ICD-10-CM | POA: Insufficient documentation

## 2013-08-30 LAB — HCG, QUANTITATIVE, PREGNANCY: hCG, Beta Chain, Quant, S: 7466 m[IU]/mL — ABNORMAL HIGH (ref <5)

## 2013-08-30 MED ORDER — AMOXICILLIN 500 MG PO CAPS: 500.0000 mg | ORAL_CAPSULE | Freq: Three times a day (TID) | ORAL | Status: DC

## 2013-08-30 MED ORDER — FLUCONAZOLE 150 MG PO TABS: 150.0000 mg | ORAL_TABLET | Freq: Once | ORAL | Status: DC

## 2013-08-30 NOTE — MAU Provider Note (Signed)
Chief Complaint: Follow-up BHCG    None    SUBJECTIVE HPI: Kathy Dean is a 32 y.o. [id number] at [redacted]w[redacted]d by LMP who presents to maternity admissions for follow up labs.  She was seen at [facility] ED 1 week ago with abdominal pain and found to have a positive pregnancy test.  They asked her to come to [facility] in 48 hours but pt did not return until today.  She was treated with abx at [facility] for suspected STDs and is concerned she has developed a yeast infection because she reports white discharge with itching.  She denies pain today, vaginal bleeding, vaginal itching/burning, urinary symptoms, h/a, dizziness, n/v, or fever/chills.     Past Medical History  Diagnosis Date  . Hypertension   . Anemia   . Pregnancy induced hypertension   . Headache(784.0)    No past surgical history on file. History   Social History  . Marital Status: Single    Spouse Name: N/A    Number of Children: N/A  . Years of Education: N/A   Occupational History  . Not on file.   Social History Main Topics  . Smoking status: Never Smoker   . Smokeless tobacco: Never Used  . Alcohol Use: Yes     Comment: socially  . Drug Use: No  . Sexual Activity: Yes    Birth Control/ Protection: Injection   Other Topics Concern  . Not on file   Social History Narrative  . No narrative on file   No current facility-administered medications on file prior to encounter.   Current Outpatient Prescriptions on File Prior to Encounter  Medication Sig Dispense Refill  . PRESCRIPTION MEDICATION Take 1 tablet by mouth daily. Birth control       Allergies  Allergen Reactions  . Latex Swelling and Dermatitis    ROS: Pertinent items in HPI  OBJECTIVE Blood pressure 133/72, pulse 87, temperature 98.2 F (36.8 C), temperature source Oral, resp. rate 18, height 5\' 2"  (1.575 m), weight 54.035 kg (119 lb 2 oz), last menstrual period 07/24/2013. GENERAL: Well-developed, well-nourished female in no acute distress.  HEENT:  Normocephalic HEART: normal rate RESP: normal effort ABDOMEN: Soft, non-tender EXTREMITIES: Nontender, no edema NEURO: Alert and oriented SPECULUM EXAM: Declined by pt--done 11/22 at [facility]  LAB RESULTS Results for orders placed during the hospital encounter of 08/30/13 (from the past 24 hour(s))  HCG, QUANTITATIVE, PREGNANCY     Status: Abnormal   Collection Time    08/30/13  1:50 PM      Result Value Range   hCG, Beta Chain, Quant, S 7466 (*) <5 mIU/mL   Results for orders placed during the hospital encounter of 08/30/13 (from the past 168 hour(s))  HCG, QUANTITATIVE, PREGNANCY   Collection Time    08/30/13  1:50 PM      Result Value Range   hCG, Beta Chain, Quant, S 7466 (*) <5 mIU/mL  Results for orders placed during the hospital encounter of 08/24/13 (from the past 168 hour(s))  HCG, QUANTITATIVE, PREGNANCY   Collection Time    08/24/13  6:42 AM      Result Value Range   hCG, Beta Chain, Quant, S 533 (*) <5 mIU/mL  URINE CULTURE   Collection Time    08/24/13  7:15 AM      Result Value Range   Specimen Description URINE, RANDOM     Special Requests NONE     Culture  Setup Time       Value:  08/24/2013 17:40     Performed at [facility] Count       Value: >=100,000 COLONIES/ML     Performed at [facility]   Culture       Value: ENTEROCOCCUS SPECIES     Performed at [facility]   Report Status 08/26/2013 FINAL     Organism ID, Bacteria ENTEROCOCCUS SPECIES       IMAGING US Ob Comp Less 14 Wks  08/24/2013   CLINICAL DATA:  Pelvic pain, evaluate for possible ectopic  EXAM: OBSTETRIC <14 WK [place] AND TRANSVAGINAL OB US  TECHNIQUE: Both transabdominal and transvaginal ultrasound examinations were performed for complete evaluation of the gestation as well as the maternal uterus, adnexal regions, and pelvic cul-de-sac. Transvaginal technique was performed to assess early pregnancy.  COMPARISON:  None.  FINDINGS: Intrauterine gestational  sac: Not appreciated.  Yolk sac:  N/A  Embryo:  N/A  Cardiac Activity: N/A  Heart Rate: N/A bpm  MSD: N/A mm    w     d  CRL:   N/A  mm    w  d                  [place] EDC:  Maternal uterus/adnexae: The uterus is retroverted. The endometrium is thickened no otherwise unremarkable. Measuring 1.9 cm in thickness.  The adnexal regions demonstrate complex cystic area within the right ovary measuring 2.1 x 2 x 1.9 cm is may represent a resolving corpus luteal cyst.  A small amount of fluid is identified within the cul-de-sac.  IMPRESSION: 1. No evidence of an intrauterine or extrauterine gestational sac 2. Findings likely representing a resolving corpus luteal cysts involving the right ovary 3. Small amount of fluid within the cul sac without further evidence of sonographic abnormalities.   Electronically Signed   By: Salome Holmes M.D.   On: 08/24/2013 09:27   US Ob Transvaginal  08/30/2013   CLINICAL DATA:  Pregnant, abdominal pain  EXAM: TRANSVAGINAL OB ULTRASOUND  TECHNIQUE: Transvaginal ultrasound was performed for complete evaluation of the gestation as well as the maternal uterus, adnexal regions, and pelvic cul-de-sac.  COMPARISON:  None.  FINDINGS: Intrauterine gestational sac: Visualized/normal in shape.  Yolk sac:  Present  Embryo:  Not visualized  MSD: 5  mm   5 w   0  d  [place] EDC: 05/02/2014  Maternal uterus/adnexae: No subchorionic hemorrhage.  Right ovary measures 2.0 x 2.6 x 3.4 cm and is notable for a corpus luteal cyst.  Left ovary measures 1.8 x 2.5 x 2.6 cm and is within normal limits.  Small volume pelvic ascites.  IMPRESSION: Single intrauterine gestational sac with yolk sac. Fetal pole is not yet visualized.  Follow-up pelvic ultrasound is suggested in 10-14 days to confirm viability.   Electronically Signed   By: Charline Bills M.D.   On: 08/30/2013 16:50     ASSESSMENT 1. Normal IUP (intrauterine pregnancy) on prenatal ultrasound   2. UTI in pregnancy, antepartum, first trimester      PLAN Discharge home F/U with early prenatal care--pt may return to Dr Gaynell Face Amoxicillin 500 mg TID x7 days for UTI--see sensitivities  Diflucan 150 mg with 1 refill for yeast symptoms following abx Pregnancy verification letter given Return to MAU as needed   Sharen Counter Certified Nurse-Midwife 08/30/2013  5:16 PM

## 2013-08-30 NOTE — MAU Note (Signed)
Pt states was told to come to MAU for f/u of BHCG levels. Was seen 08/24/2013 for abdominal pain. Was given IV fluids. Given morphine for pain. Does have pain still that was only relieved with the morphine. Has had diarrhea and feeling of gassiness since 08/25/2013. Was given Z pak and rocephin. Was not treated for yeast infection which she felt like was present.

## 2013-08-31 NOTE — MAU Provider Note (Signed)
Attestation of Attending Supervision of Advanced Practitioner: Evaluation and management procedures were performed by the PA/NP/CNM/OB Fellow under my supervision/collaboration. Chart reviewed and agree with management and plan.  Suni Jarnagin V 08/31/2013 10:06 PM

## 2013-09-01 ENCOUNTER — Telehealth (HOSPITAL_COMMUNITY): Payer: Self-pay | Admitting: Emergency Medicine

## 2013-09-01 NOTE — ED Notes (Signed)
Patient went to [facility] on 11/26 and was treated with Amoxicillin. Sensitive to same. Per protocol MD.

## 2013-09-10 ENCOUNTER — Inpatient Hospital Stay (HOSPITAL_COMMUNITY)
Admission: AD | Admit: 2013-09-10 | Discharge: 2013-09-10 | Disposition: A | Discharge status: Home / Self Care | Payer: Medicaid Other | Source: Ambulatory Visit | Attending: Family Medicine | Admitting: Family Medicine

## 2013-09-10 DIAGNOSIS — R109 Unspecified abdominal pain: Secondary | ICD-10-CM | POA: Insufficient documentation

## 2013-09-10 DIAGNOSIS — O2390 Unspecified genitourinary tract infection in pregnancy, unspecified trimester: Secondary | ICD-10-CM | POA: Insufficient documentation

## 2013-09-10 DIAGNOSIS — R42 Dizziness and giddiness: Secondary | ICD-10-CM | POA: Insufficient documentation

## 2013-09-10 DIAGNOSIS — N390 Urinary tract infection, site not specified: Secondary | ICD-10-CM | POA: Insufficient documentation

## 2013-09-10 DIAGNOSIS — O219 Vomiting of pregnancy, unspecified: Secondary | ICD-10-CM

## 2013-09-10 DIAGNOSIS — O210 Mild hyperemesis gravidarum: Secondary | ICD-10-CM | POA: Insufficient documentation

## 2013-09-10 LAB — URINALYSIS, ROUTINE W REFLEX MICROSCOPIC
Bilirubin Urine: NEGATIVE
Glucose, UA: 250 mg/dL — AB
Ketones, ur: NEGATIVE mg/dL
Leukocytes, UA: NEGATIVE
Nitrite: NEGATIVE
pH: 6 (ref 5.0–8.0)

## 2013-09-10 LAB — WET PREP, GENITAL
Clue Cells Wet Prep HPF POC: NONE SEEN
Trich, Wet Prep: NONE SEEN
Yeast Wet Prep HPF POC: NONE SEEN

## 2013-09-10 MED ORDER — ONDANSETRON 8 MG PO TBDP
8.0000 mg | ORAL_TABLET | Freq: Once | ORAL | Status: AC
  Administered 2013-09-10: 8 mg via ORAL
  Filled 2013-09-10: qty 1

## 2013-09-10 MED ORDER — CEPHALEXIN 500 MG PO CAPS: 500.0000 mg | ORAL_CAPSULE | Freq: Four times a day (QID) | ORAL | Status: DC

## 2013-09-10 MED ORDER — ONDANSETRON 4 MG PO TBDP: 4.0000 mg | ORAL_TABLET | Freq: Three times a day (TID) | ORAL | Status: DC | PRN

## 2013-09-10 MED ORDER — PROMETHAZINE HCL 25 MG PO TABS: 25.0000 mg | ORAL_TABLET | Freq: Four times a day (QID) | ORAL | Status: DC | PRN

## 2013-09-10 MED ORDER — NITROFURANTOIN MONOHYD MACRO 100 MG PO CAPS: 100.0000 mg | ORAL_CAPSULE | Freq: Two times a day (BID) | ORAL | Status: DC

## 2013-09-10 NOTE — MAU Provider Note (Signed)
History   CSN: [id number]; Arrival date and time: 09/10/13 1134  Chief Complaint  Patient presents with  . Emesis  . Abdominal Pain  . Dizziness   HPI  Kathy Dean is a 32 year old [phone] here for persistent nausea, vomiting, menstrual-like cramps, dizziness, and vaginal discharge with associated lower abdominal/flank pain for over a month. She's here today primarily for nausea and the abdominal pain. The pain is in her non-lateralized lower quadrant abdominal and lower back. She's had this for over a month and has not improved. She also expressed concern of an odor and has been using a vinegar solution to clean her external vagina.  She denies having vaginal bleeding, dysuria, urgency, frequency, constipation, or diarrhea.  Distant history: She was seen at [facility] ED for similar complaints on 08/24/2013 and was treated for suspected STI (rocephin and azithromycin given), UTI, and yeast infection. E. Coli was cultured from her urine. Upon further interview, patient states she was unable to keep down the Diflucan or any amoxicillin and claims to have visualized the capsules in her vomit. Her primary care provider suggested she come to the MAU for IV therapy.   Past Medical History  Diagnosis Date  . Hypertension   . Anemia   . Pregnancy induced hypertension   . Headache(784.0)     No past surgical history on file.  Family History  Problem Relation Age of Onset  . Other Neg Hx     History  Substance Use Topics  . Smoking status: Never Smoker   . Smokeless tobacco: Never Used  . Alcohol Use: Yes     Comment: socially    Allergies:  Allergies  Allergen Reactions  . Latex Swelling and Dermatitis    Prescriptions prior to admission  Medication Sig Dispense Refill  . acetaminophen (TYLENOL) 500 MG tablet Take 1,000 mg by mouth every 6 (six) hours as needed.        ROS Physical Exam   Blood pressure 118/67, pulse 70, temperature 99.2 F (37.3 C), temperature  source Oral, resp. rate 16, height 5\' 3"  (1.6 m), weight 53.797 kg (118 lb 9.6 oz), last menstrual period 07/24/2013, SpO2 100.00%.  Results for orders placed during the hospital encounter of 09/10/13 (from the past 24 hour(s))  URINALYSIS, ROUTINE W REFLEX MICROSCOPIC     Status: Abnormal   Collection Time    09/10/13 12:18 PM      Result Value Range   Color, Urine YELLOW  YELLOW   APPearance CLEAR  CLEAR   Specific Gravity, Urine 1.025  1.005 - 1.030   pH 6.0  5.0 - 8.0   Glucose, UA 250 (*) NEGATIVE mg/dL   Hgb urine dipstick NEGATIVE  NEGATIVE   Bilirubin Urine NEGATIVE  NEGATIVE   Ketones, ur NEGATIVE  NEGATIVE mg/dL   Protein, ur NEGATIVE  NEGATIVE mg/dL   Urobilinogen, UA 0.2  0.0 - 1.0 mg/dL   Nitrite NEGATIVE  NEGATIVE   Leukocytes, UA NEGATIVE  NEGATIVE   Results for orders placed during the hospital encounter of 09/10/13 (from the past 24 hour(s))  URINALYSIS, ROUTINE W REFLEX MICROSCOPIC     Status: Abnormal   Collection Time    09/10/13 12:18 PM      Result Value Range   Color, Urine YELLOW  YELLOW   APPearance CLEAR  CLEAR   Specific Gravity, Urine 1.025  1.005 - 1.030   pH 6.0  5.0 - 8.0   Glucose, UA 250 (*) NEGATIVE mg/dL  Hgb urine dipstick NEGATIVE  NEGATIVE   Bilirubin Urine NEGATIVE  NEGATIVE   Ketones, ur NEGATIVE  NEGATIVE mg/dL   Protein, ur NEGATIVE  NEGATIVE mg/dL   Urobilinogen, UA 0.2  0.0 - 1.0 mg/dL   Nitrite NEGATIVE  NEGATIVE   Leukocytes, UA NEGATIVE  NEGATIVE  WET PREP, GENITAL     Status: Abnormal   Collection Time    09/10/13  2:05 PM      Result Value Range   Yeast Wet Prep HPF POC NONE SEEN  NONE SEEN   Trich, Wet Prep NONE SEEN  NONE SEEN   Clue Cells Wet Prep HPF POC NONE SEEN  NONE SEEN   WBC, Wet Prep HPF POC TOO NUMEROUS TO COUNT (*) NONE SEEN    Physical Exam  HEART: Normal rate. No rubs, murmurs, or gallops. LUNGS: Clear lungs bilaterally. ABDOMEN: Tender to left and right lower quadrants. Negative McBurney rebound  tenderness. Negative Murphy's sign. Abdomen soft. Bowel sounds present in all quadrants. Mild CVA tenderness.  Pelvic exam:   VULVA: normal appearing vulva with no masses, tenderness or lesions,  VAGINA: vaginal discharge - white, malodorous and milky,  CERVIX: ectropion, cervical discharge present - white, malodorous and fishy/amine, mild cervical motion tenderness present,  UTERUS: uterus is normal size, shape, consistency and nontender,  ADNEXA: tenderness Left > Right, no masses,  Exam chaperoned by Kathy Carbon, NP.  MAU Course  Procedures None  MDM Zofran for nausea control Trial PO intake Pelvic Exam with Wet Prep Pt tolerated PO fluids and crackers Advise her to discontinue vinegar wash/douche. Dizziness likely related to dehydration due to poor tolerance of PO intake. Treat suspected UTI with macrobid or keflex due to positive culture from 11/22 ED visit and patient's inability to adhere to amoxicillin therapy.  Assessment and Plan   A: 1. UTI (lower urinary tract infection)   2. Nausea and vomiting in pregnancy     P: RX: Macrobid (due to increased likelihood of adherence; BID vs QID dosing) cost ok with patient         Phenergan        Zofran  Start prenatal care as soon as possible  Return to MAU as needed, if symptoms worsen.  I have suspicion that Kathy Dean will not fill the nitrofurantoin prescription and also refused the cefelexin as treatment. The initial plan was treat her with cefelexin due to cost but she was displeased with the QID dosing and the idea that her medical team was giving her "more chemicals." She openly stated she would rather just finish the remaining amoxicillin (TID dosing) originally prescribed to her. She was certain it was amoxicillin and that she had 20 of the 21 pills left. She did not have the bottle for me to verify that it was amoxicillin or how many capsules she had remaining. I informed her that taking an incomplete course of  antibiotics is not the best idea and that she exposes herself to an increased risk of antibiotic resistance and increased difficulty in treating subsequent UTI's. She was also educated on the risk and benefit analysis of using such "chemicals" to treat her infection. She was also informed of the consequences of having a UTI in pregnancy including preterm labor. I went on to call her [facility] on [address] to confirm the cost of nitrofurantoin (BID dosing). I verified with Kathy Dean the cost of Macrobid and she shrugged at the idea and said "that's fine." The prescription was changed  from cefelexin to nitrofurantoin and sent to her [facility] on [address]. I offered for her to have one dose of amoxicillin here in the hospital so she would have all 21 of 21 doses but she was anxious to leave and left with the verbal agreement that she would fill and take the nitrofurantoin and discontinue the amoxicillin.  Sherron Monday, PA-S 09/10/2013, 1:46 PM   Evaluation and management procedures were performed by the PA student under my supervision and collaboration. I have reviewed the note and chart, and I agree with the management and plan.  Iona Hansen Rasch, NP 09/10/2013 5:53 PM

## 2013-09-10 NOTE — MAU Note (Signed)
Patient states she has been having abdominal pain for a while. Has had nausea and vomiting but the medication she was given will not stay down. Has taken Diflucan for a yeast infection but has been unable to keep her antibiotics. Loose bowels. No bleeding.

## 2013-09-10 NOTE — MAU Provider Note (Signed)
Chart reviewed and agree with management and plan.  

## 2013-09-17 ENCOUNTER — Encounter (HOSPITAL_COMMUNITY): Payer: Self-pay | Admitting: *Deleted

## 2013-09-17 ENCOUNTER — Inpatient Hospital Stay (HOSPITAL_COMMUNITY)
Admission: AD | Admit: 2013-09-17 | Discharge: 2013-09-17 | Disposition: A | Discharge status: Home / Self Care | Payer: Medicaid Other | Source: Ambulatory Visit | Attending: Obstetrics | Admitting: Obstetrics

## 2013-09-17 DIAGNOSIS — O210 Mild hyperemesis gravidarum: Secondary | ICD-10-CM | POA: Insufficient documentation

## 2013-09-17 DIAGNOSIS — O219 Vomiting of pregnancy, unspecified: Secondary | ICD-10-CM

## 2013-09-17 DIAGNOSIS — R197 Diarrhea, unspecified: Secondary | ICD-10-CM | POA: Insufficient documentation

## 2013-09-17 HISTORY — DX: Unspecified abnormal cytological findings in specimens from cervix uteri: R87.619

## 2013-09-17 HISTORY — DX: Reserved for concepts with insufficient information to code with codable children: IMO0002

## 2013-09-17 LAB — CBC
Hemoglobin: 12.2 g/dL (ref 12.0–15.0)
MCH: 27 pg (ref 26.0–34.0)
MCHC: 34.1 g/dL (ref 30.0–36.0)
MCV: 79.2 fL (ref 78.0–100.0)
RBC: 4.52 MIL/uL (ref 3.87–5.11)

## 2013-09-17 LAB — URINALYSIS, ROUTINE W REFLEX MICROSCOPIC
Bilirubin Urine: NEGATIVE
Hgb urine dipstick: NEGATIVE
Ketones, ur: 40 mg/dL — AB
Leukocytes, UA: NEGATIVE
Specific Gravity, Urine: >1.03 — ABNORMAL HIGH (ref 1.005–1.030)
Urobilinogen, UA: 0.2 mg/dL (ref 0.0–1.0)
pH: 6 (ref 5.0–8.0)

## 2013-09-17 LAB — COMPREHENSIVE METABOLIC PANEL
ALT: 16 U/L (ref 0–35)
CO2: 25 mEq/L (ref 19–32)
Calcium: 9.5 mg/dL (ref 8.4–10.5)
Chloride: 99 mEq/L (ref 96–112)
Creatinine, Ser: 0.68 mg/dL (ref 0.50–1.10)
GFR calc Af Amer: >90 mL/min (ref >90)
GFR calc non Af Amer: >90 mL/min (ref >90)
Glucose, Bld: 97 mg/dL (ref 70–99)
Total Bilirubin: 1.3 mg/dL — ABNORMAL HIGH (ref 0.3–1.2)
Total Protein: 7.1 g/dL (ref 6.0–8.3)

## 2013-09-17 LAB — URINE MICROSCOPIC-ADD ON

## 2013-09-17 MED ORDER — LACTATED RINGERS IV BOLUS (SEPSIS)
1000.0000 mL | Freq: Once | INTRAVENOUS | Status: AC
  Administered 2013-09-17: 1000 mL via INTRAVENOUS

## 2013-09-17 MED ORDER — FAMOTIDINE IN NACL 20-0.9 MG/50ML-% IV SOLN
20.0000 mg | INTRAVENOUS | Status: AC
  Administered 2013-09-17: 20 mg via INTRAVENOUS
  Filled 2013-09-17: qty 50

## 2013-09-17 MED ORDER — RANITIDINE HCL 150 MG PO TABS: 150.0000 mg | ORAL_TABLET | Freq: Two times a day (BID) | ORAL | Status: DC

## 2013-09-17 MED ORDER — PROMETHAZINE HCL 25 MG/ML IJ SOLN
25.0000 mg | Freq: Once | INTRAVENOUS | Status: AC
  Administered 2013-09-17: 25 mg via INTRAVENOUS
  Filled 2013-09-17: qty 1

## 2013-09-17 MED ORDER — ONDANSETRON HCL 4 MG/2ML IJ SOLN
4.0000 mg | INTRAMUSCULAR | Status: AC
  Administered 2013-09-17: 4 mg via INTRAVENOUS
  Filled 2013-09-17: qty 2

## 2013-09-17 NOTE — MAU Note (Signed)
ongoing problem with vomiting.  Has had medicines switched.  Is on rx for UTI.  Has been really dizzy.

## 2013-09-17 NOTE — MAU Note (Signed)
States too dizzy to stand and complete orthostatic check.

## 2013-09-17 NOTE — Progress Notes (Signed)
Written and verbal d/c instructions given to to pt and pt's mother and understanding voiced. Pt d/c via w/c with her mother.To call Dr Gaynell Face asap for appt to begin prenatal care

## 2013-09-17 NOTE — MAU Note (Signed)
Concerned that some of the problems (diarrhea- prior to preg) may be related to drinking- ?liver prob.

## 2013-09-17 NOTE — MAU Provider Note (Signed)
Chief Complaint: Emesis During Pregnancy and Hematemesis   First Provider Initiated Contact with Patient 09/17/13 1146     SUBJECTIVE HPI: Kathy Dean is a 32 y.o. [id number] at [redacted]w[redacted]d by LMP who presents to maternity admissions reporting nausea/vomiting x2 months with some blood in her emesis x2 days, described as dark red.  She reports chronic diarrhea before pregnancy which has improved.  She was prescribed Zofran at a previous MAU visit but reports it did not work so she is not taking it.  She feels dizzy and weak when she stands up.  She denies abdominal pain, vaginal bleeding, vaginal itching/burning, urinary symptoms, h/a, or fever/chills.     Past Medical History  Diagnosis Date  . Hypertension   . Anemia   . Pregnancy induced hypertension   . Headache(784.0)   . Preterm labor   . Infection     UTI  . Abnormal Pap smear     rpt was ok   Past Surgical History  Procedure Laterality Date  . No past surgeries     History   Social History  . Marital Status: Single    Spouse Name: N/A    Number of Children: N/A  . Years of Education: N/A   Occupational History  . Not on file.   Social History Main Topics  . Smoking status: Never Smoker   . Smokeless tobacco: Never Used  . Alcohol Use: 10.5 oz/week    21 drink(s) per week     Comment: -prior to + preg  . Drug Use: No  . Sexual Activity: Yes    Birth Control/ Protection: Injection   Other Topics Concern  . Not on file   Social History Narrative  . No narrative on file   No current facility-administered medications on file prior to encounter.   Current Outpatient Prescriptions on File Prior to Encounter  Medication Sig Dispense Refill  . acetaminophen (TYLENOL) 500 MG tablet Take 1,000 mg by mouth every 6 (six) hours as needed for mild pain.       . nitrofurantoin, macrocrystal-monohydrate, (MACROBID) 100 MG capsule Take 1 capsule (100 mg total) by mouth 2 (two) times daily.  10 capsule  0  . ondansetron (ZOFRAN  ODT) 4 MG disintegrating tablet Take 1 tablet (4 mg total) by mouth every 8 (eight) hours as needed for nausea or vomiting.  10 tablet  0  . promethazine (PHENERGAN) 25 MG tablet Take 1 tablet (25 mg total) by mouth every 6 (six) hours as needed for nausea or vomiting.  30 tablet  0   Allergies  Allergen Reactions  . Latex Swelling and Dermatitis    ROS: Pertinent items in HPI  OBJECTIVE Blood pressure 113/66, pulse 92, temperature 98.2 F (36.8 C), temperature source Oral, resp. rate 18, height 5\' 4"  (1.626 m), weight 50.803 kg (112 lb), last menstrual period 07/24/2013, SpO2 99.00%. GENERAL: Well-developed, well-nourished female in no acute distress.  HEENT: Normocephalic HEART: normal rate, rhythm, heart sounds RESP: normal effort, lung sounds clear and equal bilaterally ABDOMEN: Soft, non-tender EXTREMITIES: Nontender, no edema NEURO: Alert and oriented   LAB RESULTS Results for orders placed during the hospital encounter of 09/17/13 (from the past 24 hour(s))  URINALYSIS, ROUTINE W REFLEX MICROSCOPIC     Status: Abnormal   Collection Time    09/17/13 11:10 AM      Result Value Range   Color, Urine YELLOW  YELLOW   APPearance CLEAR  CLEAR   Specific Gravity, Urine >1.030 (*)  1.005 - 1.030   pH 6.0  5.0 - 8.0   Glucose, UA NEGATIVE  NEGATIVE mg/dL   Hgb urine dipstick NEGATIVE  NEGATIVE   Bilirubin Urine NEGATIVE  NEGATIVE   Ketones, ur 40 (*) NEGATIVE mg/dL   Protein, ur 30 (*) NEGATIVE mg/dL   Urobilinogen, UA 0.2  0.0 - 1.0 mg/dL   Nitrite NEGATIVE  NEGATIVE   Leukocytes, UA NEGATIVE  NEGATIVE  URINE MICROSCOPIC-ADD ON     Status: None   Collection Time    09/17/13 11:10 AM      Result Value Range   Squamous Epithelial / LPF RARE  RARE   WBC, UA 0-2  <3 WBC/hpf   Bacteria, UA RARE  RARE  CBC     Status: Abnormal   Collection Time    09/17/13 12:08 PM      Result Value Range   WBC 13.7 (*) 4.0 - 10.5 K/uL   RBC 4.52  3.87 - 5.11 MIL/uL   Hemoglobin 12.2  12.0  - 15.0 g/dL   HCT 45.4 (*) 09.8 - 11.9 %   MCV 79.2  78.0 - 100.0 fL   MCH 27.0  26.0 - 34.0 pg   MCHC 34.1  30.0 - 36.0 g/dL   RDW 14.7  82.9 - 56.2 %   Platelets 276  150 - 400 K/uL  COMPREHENSIVE METABOLIC PANEL     Status: Abnormal   Collection Time    09/17/13 12:08 PM      Result Value Range   Sodium 135  135 - 145 mEq/L   Potassium 4.1  3.5 - 5.1 mEq/L   Chloride 99  96 - 112 mEq/L   CO2 25  19 - 32 mEq/L   Glucose, Bld 97  70 - 99 mg/dL   BUN 12  6 - 23 mg/dL   Creatinine, Ser 1.30  0.50 - 1.10 mg/dL   Calcium 9.5  8.4 - 86.5 mg/dL   Total Protein 7.1  6.0 - 8.3 g/dL   Albumin 3.9  3.5 - 5.2 g/dL   AST 18  0 - 37 U/L   ALT 16  0 - 35 U/L   Alkaline Phosphatase 51  39 - 117 U/L   Total Bilirubin 1.3 (*) 0.3 - 1.2 mg/dL   GFR calc non Af Amer >90  >90 mL/min   GFR calc Af Amer >90  >90 mL/min    ASSESSMENT 1. Nausea/vomiting in pregnancy     PLAN CBC, CMP, Phenergan 25 mg in 1000 ml D5LR, LR x1000 ml, Zofran 4 mg IV, Pepcid 20 mg IV in MAU Discharge home Continue macrobid BID until course completed (2 more days).  Nausea may improve after abx completed. Pt has phenergan prescription she has not filled.  Take Phenergan and Zofran as prescribed Increase PO fluids as tolerated F/U with early prenatal care with Dr Gaynell Face Return to MAU as needed     Medication List         acetaminophen 500 MG tablet  Commonly known as:  TYLENOL  Take 1,000 mg by mouth every 6 (six) hours as needed for mild pain.     nitrofurantoin (macrocrystal-monohydrate) 100 MG capsule  Commonly known as:  MACROBID  Take 1 capsule (100 mg total) by mouth 2 (two) times daily.     ondansetron 4 MG disintegrating tablet  Commonly known as:  ZOFRAN ODT  Take 1 tablet (4 mg total) by mouth every 8 (eight) hours as needed for nausea or  vomiting.     promethazine 25 MG tablet  Commonly known as:  PHENERGAN  Take 1 tablet (25 mg total) by mouth every 6 (six) hours as needed for nausea or  vomiting.       Follow-up Information   Follow up with Kathreen Cosier, MD.   Specialty:  Obstetrics and Gynecology   Contact information:   [address] [place] [address] [phone]       Sharen Counter Certified Nurse-Midwife 09/17/2013  1:33 PM

## 2013-09-18 ENCOUNTER — Other Ambulatory Visit: Payer: Self-pay | Admitting: Obstetrics

## 2013-09-19 ENCOUNTER — Encounter (HOSPITAL_COMMUNITY): Payer: Self-pay | Admitting: *Deleted

## 2013-09-19 ENCOUNTER — Inpatient Hospital Stay (HOSPITAL_COMMUNITY)
Admission: AD | Admit: 2013-09-19 | Discharge: 2013-09-22 | DRG: 781 | Disposition: A | Discharge status: Home / Self Care | Payer: Medicaid Other | Source: Ambulatory Visit | Attending: Obstetrics | Admitting: Obstetrics

## 2013-09-19 DIAGNOSIS — R042 Hemoptysis: Secondary | ICD-10-CM | POA: Diagnosis present

## 2013-09-19 DIAGNOSIS — R197 Diarrhea, unspecified: Secondary | ICD-10-CM | POA: Diagnosis present

## 2013-09-19 DIAGNOSIS — O210 Mild hyperemesis gravidarum: Principal | ICD-10-CM | POA: Diagnosis present

## 2013-09-19 DIAGNOSIS — O99891 Other specified diseases and conditions complicating pregnancy: Secondary | ICD-10-CM | POA: Diagnosis present

## 2013-09-19 DIAGNOSIS — E41 Nutritional marasmus: Secondary | ICD-10-CM | POA: Diagnosis present

## 2013-09-19 LAB — CBC
Hemoglobin: 11.4 g/dL — ABNORMAL LOW (ref 12.0–15.0)
MCH: 27.4 pg (ref 26.0–34.0)
Platelets: 268 10*3/uL (ref 150–400)
RDW: 13.5 % (ref 11.5–15.5)
WBC: 12.8 10*3/uL — ABNORMAL HIGH (ref 4.0–10.5)

## 2013-09-19 LAB — URINALYSIS, ROUTINE W REFLEX MICROSCOPIC
Bilirubin Urine: NEGATIVE
Ketones, ur: 40 mg/dL — AB
Nitrite: NEGATIVE
Protein, ur: NEGATIVE mg/dL
Specific Gravity, Urine: 1.025 (ref 1.005–1.030)
Urobilinogen, UA: 0.2 mg/dL (ref 0.0–1.0)

## 2013-09-19 MED ORDER — DEXTROSE 5 % IN LACTATED RINGERS IV BOLUS
1000.0000 mL | Freq: Once | INTRAVENOUS | Status: AC
  Administered 2013-09-19: 1000 mL via INTRAVENOUS

## 2013-09-19 MED ORDER — ONDANSETRON HCL 4 MG/2ML IJ SOLN
4.0000 mg | Freq: Once | INTRAMUSCULAR | Status: AC
  Administered 2013-09-19: 4 mg via INTRAVENOUS
  Filled 2013-09-19: qty 2

## 2013-09-19 MED ORDER — ONDANSETRON HCL 4 MG/2ML IJ SOLN
4.0000 mg | INTRAMUSCULAR | Status: DC | PRN
  Administered 2013-09-20 – 2013-09-22 (×2): 4 mg via INTRAVENOUS
  Filled 2013-09-19 (×2): qty 2

## 2013-09-19 MED ORDER — LACTATED RINGERS IV SOLN
INTRAVENOUS | Status: DC
  Administered 2013-09-19 – 2013-09-22 (×8): via INTRAVENOUS

## 2013-09-19 MED ORDER — FAMOTIDINE IN NACL 20-0.9 MG/50ML-% IV SOLN
20.0000 mg | Freq: Once | INTRAVENOUS | Status: AC
  Administered 2013-09-19: 20 mg via INTRAVENOUS
  Filled 2013-09-19: qty 50

## 2013-09-19 MED ORDER — FAMOTIDINE IN NACL 20-0.9 MG/50ML-% IV SOLN
20.0000 mg | Freq: Two times a day (BID) | INTRAVENOUS | Status: DC
  Administered 2013-09-19 – 2013-09-21 (×4): 20 mg via INTRAVENOUS
  Filled 2013-09-19 (×5): qty 50

## 2013-09-19 NOTE — H&P (Signed)
Pt is [redacted]w[redacted]d pregnant presenting with hyperemesis for 2 months , now with 10 lb weight loss in 3 weeks and having hemoptysis And diarrhea. See H&P note and orders Discussed with Dr. Gaynell Face- will admit to 3rd floor with IVF and antiemetics Pamelia Hoit, WHNP-BC

## 2013-09-19 NOTE — MAU Provider Note (Signed)
History     CSN: [id number]  Arrival date and time: 09/19/13 9562   First Provider Initiated Contact with Patient 09/19/13 0932      Chief Complaint  Patient presents with  . Morning Sickness   HPI  Pt is [id number] @ [redacted]w[redacted]d pregnant and c/o of hyperemesis for 2 months.  Pt has had bleeding with vomiting. Pt is having diarrhea every time she vomits- watery. Pt  Has lost 10 lbs over 3 weeks.  Pt was treated for UTI and has one pill left for UTI.  Pt had abd pain which has gotten Better since treated for UTI.  Pt has hx of preterm labor. 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  Past Medical History  Diagnosis Date  . Hypertension   . Anemia   . Pregnancy induced hypertension   . Headache(784.0)   . Preterm labor   . Infection     UTI  . Abnormal Pap smear     rpt was ok    Past Surgical History  Procedure Laterality Date  . No past surgeries      Family History  Problem Relation Age of Onset  . Other Neg Hx   . Cancer Maternal Grandfather   . Cancer Paternal Grandmother     History  Substance Use Topics  . Smoking status: Never Smoker   . Smokeless tobacco: Never Used  . Alcohol Use: 10.5 oz/week    21 drink(s) per week     Comment: -prior to + preg    Allergies:  Allergies  Allergen Reactions  . Latex Swelling and Dermatitis    Prescriptions prior to admission  Medication Sig Dispense Refill  . acetaminophen (TYLENOL) 500 MG tablet Take 1,000 mg by mouth every 6 (six) hours as needed for mild pain.       . nitrofurantoin, macrocrystal-monohydrate, (MACROBID) 100 MG capsule Take 1 capsule (100 mg total) by mouth 2 (two) times daily.  10 capsule  0  . ondansetron (ZOFRAN ODT) 4 MG disintegrating tablet Take 1 tablet (4 mg total) by mouth every 8 (eight) hours as needed for nausea or vomiting.  10 tablet  0  . promethazine (PHENERGAN) 25 MG tablet Take 1 tablet (25 mg total) by mouth every 6 (six) hours as needed for  nausea or vomiting.  30 tablet  0  . ranitidine (ZANTAC) 150 MG tablet Take 1 tablet (150 mg total) by mouth 2 (two) times daily.  60 tablet  2    Review of Systems  Gastrointestinal: Positive for nausea, vomiting, abdominal pain and diarrhea.  Genitourinary: Negative for dysuria.   Physical Exam   Blood pressure 111/97, pulse 76, temperature 97.5 F (36.4 C), temperature source Oral, resp. rate 18, height 5\' 4"  (1.626 m), weight 51.438 kg (113 lb 6.4 oz), last menstrual period 07/24/2013, SpO2 99.00%.  Physical Exam  Nursing note and vitals reviewed. Constitutional: She is oriented to person, place, and time. She appears well-developed.  uncomfortable  HENT:  Head: Normocephalic.  Eyes: Pupils are equal, round, and reactive to light.  Neck: Normal range of motion.  Cardiovascular: Normal rate.   Respiratory: Effort normal.  Musculoskeletal: Normal range of motion.  Neurological: She is alert and oriented to person, place, and time.  Skin: Skin is warm and dry. There is pallor.  Psychiatric: She has a normal mood and affect.    MAU Course  Procedures CBC, CMET. IV D5LR with zofran 4 mg and  pepcid 20mg  IVP Will admit to 3rd floor per Dr. Gaynell Face  Assessment and Plan    LINEBERRY,SUSAN 09/19/2013, 9:33 AM

## 2013-09-19 NOTE — MAU Note (Signed)
Patient presents to MAU with c/o nausea and vomiting that has been ongoing throughout pregnancy. States at 4 am started seeing blood in emesis.

## 2013-09-20 MED ORDER — BOOST / RESOURCE BREEZE PO LIQD
237.0000 mL | Freq: Three times a day (TID) | ORAL | Status: DC
  Administered 2013-09-20: 1 via ORAL
  Filled 2013-09-20 (×4): qty 1

## 2013-09-20 NOTE — H&P (Signed)
This is Dr. Francoise Ceo dictating the history and physical on  Kathy Dean she's a 31 year old gravida 5 para 0313 at 8 weeks and 2 days [facility] 04/30/2013 15 the patient was admitted because of hyperemesis states that the past month she is unable to hold anything down to +10 pounds is so she was brought in for hydration and treatment Past medical history negative Past surgical history negative Social history negative System review other the negative physical exam well-developed female in mild distress HEENT negative Lungs clear to P&A Heart regular rhythm no murmurs no gallops Breasts negative Abdomen negative Uterus 8 week size Extremities negative and

## 2013-09-20 NOTE — Progress Notes (Signed)
INITIAL NUTRITION ASSESSMENT  DOCUMENTATION CODES Per approved criteria  -Severe malnutrition in the context of acute illness or injury   INTERVENTION: Antenatal Diet Resource Breeze TID Discussed diet interventions with pt  NUTRITION DIAGNOSIS: Inadequate oral intake related to hyperemesis as evidenced by  Weight loss.   Goal: tol of regular diet/ weight gain  Monitor:  Weight, diet tol  Reason for Assessment: Weight loss  32 y.o. female  Admitting Dx: <principal problem not specified>Hyperemesis  ASSESSMENT: Pt reports minimal po intake for past 4 weeks with a 10 lb weight loss. Clothes loose, can count her ribs now, clavicles prominent. Today was the first day in weeks that she has been able to keep solid food down. Meets criteria for Dx of severe malnutrition/acute due to a > 5% loss of usual weight and po intake < 50 % of estimated needs for > 7 days, mod depletion of fat.  Height: Ht Readings from Last 1 Encounters:  09/19/13 5\' 4"  (1.626 m)    Weight: Wt Readings from Last 1 Encounters:  09/20/13 114 lb 8 oz (51.937 kg)    Ideal Body Weight: 120 lbs  % Ideal Body Weight: 95%  Wt Readings from Last 10 Encounters:  09/20/13 114 lb 8 oz (51.937 kg)  09/17/13 112 lb (50.803 kg)  09/10/13 118 lb 9.6 oz (53.797 kg)  08/30/13 119 lb 2 oz (54.035 kg)  07/18/13 121 lb 3.2 oz (54.976 kg)  09/12/12 120 lb (54.432 kg)    Usual Body Weight: 122 lbs  % Usual Body Weight: 93%  BMI:  Body mass index is 19.64 kg/(m^2). pre-preg BMI 21  Estimated Nutritional Needs: Kcal: 15-1700 Protein: 44-55 g Fluid: 1.6L   Diet Order: General antenatal regular  EDUCATION NEEDS: -Education needs addressed. Discussed diet for hyperemesis with pt, verbal incounter   Intake/Output Summary (Last 24 hours) at 09/20/13 1400 Last data filed at 09/20/13 1210  Gross per 24 hour  Intake   1420 ml  Output   1450 ml  Net    -30 ml   Labs:   Recent Labs Lab 09/17/13 1208   NA 135  K 4.1  CL 99  CO2 25  BUN 12  CREATININE 0.68  CALCIUM 9.5  GLUCOSE 97    CBG (last 3)  No results found for this basename: GLUCAP,  in the last 72 hours  Scheduled Meds: . famotidine (PEPCID) IV  20 mg Intravenous BID  . feeding supplement (RESOURCE BREEZE)  237 mL Oral TID WC    Continuous Infusions: . lactated ringers 125 mL/hr at 09/20/13 9562    Past Medical History  Diagnosis Date  . Hypertension   . Anemia   . Pregnancy induced hypertension   . Headache(784.0)   . Preterm labor   . Infection     UTI  . Abnormal Pap smear     rpt was ok    Past Surgical History  Procedure Laterality Date  . No past surgeries      Elisabeth Cara M.Odis Luster LDN Neonatal Nutrition Support Specialist Pager [phone]

## 2013-09-20 NOTE — Progress Notes (Signed)
UR completed 

## 2013-09-20 NOTE — Progress Notes (Signed)
Patient ID: Kathy Dean, female   DOB: [birth year], 32 y.o.   MRN: [id number] Vital signs normal Patient at clear liquids yesterday and did not however have any nausea vomiting and she states she would like to try regular that today to so we'll see how she tolerates that patient is up and about no complaints

## 2013-09-21 MED ORDER — FAMOTIDINE 20 MG PO TABS: 20.0000 mg | ORAL_TABLET | Freq: Two times a day (BID) | ORAL | Status: DC

## 2013-09-21 MED ORDER — ENSURE COMPLETE PO LIQD
237.0000 mL | Freq: Two times a day (BID) | ORAL | Status: DC
  Administered 2013-09-22: 237 mL via ORAL
  Filled 2013-09-21 (×4): qty 237

## 2013-09-21 MED ORDER — ZOLPIDEM TARTRATE 5 MG PO TABS
5.0000 mg | ORAL_TABLET | Freq: Every evening | ORAL | Status: DC | PRN
Start: 2013-09-21 — End: 2013-09-22

## 2013-09-21 MED ORDER — FAMOTIDINE IN NACL 20-0.9 MG/50ML-% IV SOLN
20.0000 mg | Freq: Once | INTRAVENOUS | Status: AC
  Administered 2013-09-21: 20 mg via INTRAVENOUS
  Filled 2013-09-21: qty 50

## 2013-09-21 NOTE — Progress Notes (Signed)
Patient ID: Kathy Dean, female   DOB: [birth year], 32 y.o.   MRN: [id number] Hospital Day: 3  S: Preterm labor symptoms: nausea and vomiting  O: Blood pressure 113/72, pulse 61, temperature 98.1 F (36.7 C), temperature source Oral, resp. rate 16, height 5\' 4"  (1.626 m), weight 112 lb (50.803 kg), last menstrual period 07/24/2013, SpO2 100.00%.   FHT:n/a Toco: None SVE:   A/P- 32 y.o. admitted with:  N/V.  Stable.  Advance diet.  Present on Admission:  **None**  Pregnancy Complications: Hyperemesis  Preterm labor management: no treatment necessary Dating:  [redacted]w[redacted]d PNL Needed:  none FWB:  good PTL:  stable ROD: n/a

## 2013-09-21 NOTE — Progress Notes (Signed)
The patient is receiving Famotidien (Pepcid) by the intravenous route.  Based on criteria approved by the Pharmacy and Therapeutics Committee and the Medical Executive Committee, the medication is being converted to the equivalent oral dose form.  These criteria include: -No Active GI bleeding -Able to tolerate diet of full liquids (or better) or tube feeding -Able to tolerate other medications by the oral or enteral route  If you have any questions about this conversion, please contact the Pharmacy Department (ext [phone]).  Thank you.  Natasha Bence 09/21/2013

## 2013-09-21 NOTE — Progress Notes (Signed)
Patient able to tolerate food on three occassions this shift.  Tolerating po fluids well.  No emesis noted this shift.  Patient resting in bed, family in room.  Call bell in reach, will continue to monitor.  Osvaldo Angst, RN--------------

## 2013-09-22 MED ORDER — ONDANSETRON 8 MG PO TBDP
8.0000 mg | ORAL_TABLET | Freq: Three times a day (TID) | ORAL | Status: DC | PRN
  Filled 2013-09-22: qty 1

## 2013-09-22 MED ORDER — METRONIDAZOLE 0.75 % VA GEL
1.0000 | Freq: Two times a day (BID) | VAGINAL | Status: DC
  Administered 2013-09-22: 1 via VAGINAL
  Filled 2013-09-22: qty 70

## 2013-09-22 MED ORDER — METRONIDAZOLE 0.75 % VA GEL: 1.0000 | Freq: Two times a day (BID) | VAGINAL | Status: AC

## 2013-09-22 MED ORDER — ONDANSETRON 8 MG PO TBDP: 8.0000 mg | ORAL_TABLET | Freq: Three times a day (TID) | ORAL | Status: DC | PRN

## 2013-09-22 MED ORDER — ENSURE COMPLETE PO LIQD: 237.0000 mL | Freq: Two times a day (BID) | ORAL | Status: DC

## 2013-09-22 NOTE — Progress Notes (Signed)
Patient ID: Kathy Dean, female   DOB: Jun 08, 1981, 32 y.o.   MRN: [id number] Hospital Day: 4  S: Preterm labor symptoms: nausea and vomiting  O: Blood pressure 109/63, pulse 64, temperature 98.3 F (36.8 C), temperature source Oral, resp. rate 18, height 5\' 4"  (1.626 m), weight 112 lb 0.8 oz (50.826 kg), last menstrual period 07/24/2013, SpO2 100.00%.   FHT:n/a Toco: None SVE:   A/P- 32 y.o. admitted with:  N/V.  Stable.  Continue nutritional support.  Present on Admission:  **None**  Pregnancy Complications: Hyperemesis  Preterm labor management: no treatment necessary Dating:  [redacted]w[redacted]d PNL Needed:  none FWB:  good PTL:  stable ROD: n/a

## 2013-09-22 NOTE — Discharge Summary (Signed)
Physician Discharge Summary  Patient ID: ZABDI MIS MRN: [id number] DOB/AGE: 1980/10/06 32 y.o.  Admit date: 09/19/2013 Discharge date: 09/22/2013  Admission Diagnoses:  Hyperemesis Gravidarum  Discharge Diagnoses:  Same Active Problems:   * No active hospital problems. *   Discharged Condition: good  Hospital Course: Admitted with N/V.  Responded well to therapy.  Discharged home in good condition.  Consults: None  Significant Diagnostic Studies: labs: CBC, CMET  Treatments: IV hydration and antiemetics.  Discharge Exam: Blood pressure 109/63, pulse 64, temperature 98.3 F (36.8 C), temperature source Oral, resp. rate 18, height 5\' 4"  (1.626 m), weight 112 lb 0.8 oz (50.826 kg), last menstrual period 07/24/2013, SpO2 100.00%. General appearance: alert and no distress GI: normal findings: soft, non-tender  Disposition: 01-Home or Self Care  Discharge Orders   Future Orders Complete By Expires   Discharge diet:  As directed        Medication List         acetaminophen 500 MG tablet  Commonly known as:  TYLENOL  Take 1,000 mg by mouth every 6 (six) hours as needed for mild pain.     metroNIDAZOLE 0.75 % vaginal gel  Commonly known as:  METROGEL  Place 1 Applicatorful vaginally 2 (two) times daily.     nitrofurantoin (macrocrystal-monohydrate) 100 MG capsule  Commonly known as:  MACROBID  Take 1 capsule (100 mg total) by mouth 2 (two) times daily.     ondansetron 8 MG disintegrating tablet  Commonly known as:  ZOFRAN-ODT  Take 1 tablet (8 mg total) by mouth every 8 (eight) hours as needed for nausea or vomiting.         Signed: HARPER,CHARLES A 09/22/2013, 9:22 AM

## 2013-09-22 NOTE — Progress Notes (Signed)
Discharge instructions reviewed with patient.  Patient states understanding of home care, medications, diet, signs/symptoms to report to MD and return MD office visit.  No home equipment needed.  Patient ambulated for discharge in stable condition with staff without incident.

## 2013-10-03 NOTE — L&D Delivery Note (Signed)
Delivery Note At 4:40 PM a viable female was delivered via  (Presentation: ;  ).  APGAR: , ; weight .   Placenta status: , .  Cord:  with the following complications: .  Cord pH: not done  Anesthesia:   Episiotomy:  Lacerations:  Suture Repair: 2.0 Est. Blood Loss (mL):   Mom to postpartum.  Baby to Couplet care / Skin to Skin.  Marishka Rentfrow A 04/24/2014, 4:49 PM

## 2013-10-14 ENCOUNTER — Other Ambulatory Visit (HOSPITAL_COMMUNITY): Payer: Self-pay | Admitting: Obstetrics

## 2013-10-14 DIAGNOSIS — Z3689 Encounter for other specified antenatal screening: Secondary | ICD-10-CM

## 2013-10-14 LAB — OB RESULTS CONSOLE ABO/RH: RH TYPE: POSITIVE

## 2013-10-14 LAB — OB RESULTS CONSOLE RUBELLA ANTIBODY, IGM: RUBELLA: IMMUNE

## 2013-10-14 LAB — OB RESULTS CONSOLE HEPATITIS B SURFACE ANTIGEN: Hepatitis B Surface Ag: NEGATIVE

## 2013-10-14 LAB — OB RESULTS CONSOLE HIV ANTIBODY (ROUTINE TESTING): HIV: NONREACTIVE

## 2013-10-14 LAB — OB RESULTS CONSOLE RPR: RPR: NONREACTIVE

## 2013-10-14 LAB — OB RESULTS CONSOLE ANTIBODY SCREEN: Antibody Screen: NEGATIVE

## 2013-10-14 LAB — OB RESULTS CONSOLE GC/CHLAMYDIA
Chlamydia: NEGATIVE
Gonorrhea: NEGATIVE

## 2013-10-15 ENCOUNTER — Ambulatory Visit (HOSPITAL_COMMUNITY)
Admission: RE | Admit: 2013-10-15 | Discharge: 2013-10-15 | Disposition: A | Discharge status: Home / Self Care | Payer: Medicaid Other | Source: Ambulatory Visit | Attending: Obstetrics | Admitting: Obstetrics

## 2013-10-15 DIAGNOSIS — Z3689 Encounter for other specified antenatal screening: Secondary | ICD-10-CM | POA: Insufficient documentation

## 2013-10-22 ENCOUNTER — Ambulatory Visit (HOSPITAL_COMMUNITY): Payer: No Typology Code available for payment source

## 2013-11-07 ENCOUNTER — Inpatient Hospital Stay (HOSPITAL_COMMUNITY)
Admission: AD | Admit: 2013-11-07 | Discharge: 2013-11-07 | Disposition: A | Discharge status: Home / Self Care | Payer: Medicaid Other | Source: Ambulatory Visit | Attending: Obstetrics | Admitting: Obstetrics

## 2013-11-07 ENCOUNTER — Encounter (HOSPITAL_COMMUNITY): Payer: Self-pay | Admitting: *Deleted

## 2013-11-07 DIAGNOSIS — N949 Unspecified condition associated with female genital organs and menstrual cycle: Secondary | ICD-10-CM

## 2013-11-07 DIAGNOSIS — O99891 Other specified diseases and conditions complicating pregnancy: Secondary | ICD-10-CM | POA: Insufficient documentation

## 2013-11-07 DIAGNOSIS — R109 Unspecified abdominal pain: Secondary | ICD-10-CM | POA: Insufficient documentation

## 2013-11-07 DIAGNOSIS — R0602 Shortness of breath: Secondary | ICD-10-CM | POA: Insufficient documentation

## 2013-11-07 DIAGNOSIS — O210 Mild hyperemesis gravidarum: Secondary | ICD-10-CM | POA: Insufficient documentation

## 2013-11-07 DIAGNOSIS — O9989 Other specified diseases and conditions complicating pregnancy, childbirth and the puerperium: Principal | ICD-10-CM

## 2013-11-07 LAB — URINALYSIS, ROUTINE W REFLEX MICROSCOPIC
Bilirubin Urine: NEGATIVE
GLUCOSE, UA: NEGATIVE mg/dL
HGB URINE DIPSTICK: NEGATIVE
KETONES UR: NEGATIVE mg/dL
LEUKOCYTES UA: NEGATIVE
Nitrite: NEGATIVE
PH: 6.5 (ref 5.0–8.0)
Protein, ur: NEGATIVE mg/dL
Specific Gravity, Urine: 1.015 (ref 1.005–1.030)
Urobilinogen, UA: 0.2 mg/dL (ref 0.0–1.0)

## 2013-11-07 LAB — WET PREP, GENITAL
Clue Cells Wet Prep HPF POC: NONE SEEN
Trich, Wet Prep: NONE SEEN
Yeast Wet Prep HPF POC: NONE SEEN

## 2013-11-07 NOTE — Discharge Instructions (Signed)
Abdominal Pain During Pregnancy  Belly (abdominal) pain is common during pregnancy. Most of the time, it is not a serious problem. Other times, it can be a sign that something is wrong with the pregnancy. Always tell your doctor if you have belly pain.  HOME CARE  Monitor your belly pain for any changes. The following actions may help you feel better:  · Do not have sex (intercourse) or put anything in your vagina until you feel better.  · Rest until your pain stops.  · Drink clear fluids if you feel sick to your stomach (nauseous). Do not eat solid food until you feel better.  · Only take medicine as told by your doctor.  · Keep all doctor visits as told.  GET HELP RIGHT AWAY IF:   · You are bleeding, leaking fluid, or pieces of tissue come out of your vagina.  · You have more pain or cramping.  · You keep throwing up (vomiting).  · You have pain when you pee (urinate) or have blood in your pee.  · You have a fever.  · You do not feel your baby moving as much.  · You feel very weak or feel like passing out.  · You have trouble breathing, with or without belly pain.  · You have a very bad headache and belly pain.  · You have fluid leaking from your vagina and belly pain.  · You keep having watery poop (diarrhea).  · Your belly pain does not go away after resting, or the pain gets worse.  MAKE SURE YOU:   · Understand these instructions.  · Will watch your condition.  · Will get help right away if you are not doing well or get worse.  Document Released: 09/07/2009 Document Revised: 05/22/2013 Document Reviewed: 04/18/2013  ExitCare® Patient Information ©2014 [employer].

## 2013-11-07 NOTE — MAU Provider Note (Signed)
History     CSN: [id number]  Arrival date and time: 11/07/13 1132   First Provider Initiated Contact with Patient 11/07/13 1300      Chief Complaint  Patient presents with  . Abdominal Pain  . Shortness of Breath   HPI Ms. Kathy Dean is a 33 y.o. [id number] at [redacted]w[redacted]d who presents to MAU today with complaint of lower abdominal cramping and pressure x 2 days that became worse yesterday. She also endorses a yellow-white discharge. She states recent treatment for yeast infection by Dr. Gaynell Face. She denies itching, vaginal bleeding, chest pain fever or syncope. She states she felt SOB last night with a cramp, that resolved. She has N/V throughout the pregnancy that is improving.   OB History   Grav Para Term Preterm Abortions TAB SAB Ect Mult Living   8 3 0 3 4 4    3       Past Medical History  Diagnosis Date  . Hypertension   . Anemia   . Pregnancy induced hypertension   . Headache(784.0)   . Preterm labor   . Infection     UTI  . Abnormal Pap smear     rpt was ok    Past Surgical History  Procedure Laterality Date  . No past surgeries      Family History  Problem Relation Age of Onset  . Other Neg Hx   . Cancer Maternal Grandfather   . Cancer Paternal Grandmother     History  Substance Use Topics  . Smoking status: Never Smoker   . Smokeless tobacco: Never Used  . Alcohol Use: 10.5 oz/week    21 drink(s) per week     Comment: -prior to + preg    Allergies:  Allergies  Allergen Reactions  . Flagyl [Metronidazole] Swelling and Dermatitis    Patient states can take gel form.   . Latex Swelling and Dermatitis    Prescriptions prior to admission  Medication Sig Dispense Refill  . Prenatal Vit-Fe Fumarate-FA (PRENATAL MULTIVITAMIN) TABS tablet Take 1 tablet by mouth daily at 12 noon.        Review of Systems  Constitutional: Negative for fever and malaise/fatigue.  Gastrointestinal: Positive for nausea, vomiting and abdominal pain.  Genitourinary:        Neg - vaginal bleeding + vaginal discharge   Physical Exam   Blood pressure 112/64, pulse 75, temperature 98.3 F (36.8 C), temperature source Oral, resp. rate 16, height 5\' 2"  (1.575 m), weight 107 lb 3.2 oz (48.626 kg), last menstrual period 07/24/2013, SpO2 100.00%.  Physical Exam  Constitutional: She is oriented to person, place, and time. She appears well-developed and well-nourished. No distress.  HENT:  Head: Normocephalic and atraumatic.  Cardiovascular: Normal rate.   Respiratory: Effort normal.  Genitourinary: Uterus is enlarged (appropriate for GA) and tender (mild). Cervix exhibits friability. Cervix exhibits no motion tenderness and no discharge. No bleeding around the vagina. Vaginal discharge (small amount of thin, white, mucus discharge noted) found.  Neurological: She is alert and oriented to person, place, and time.  Skin: Skin is warm and dry. No erythema.  Psychiatric: She has a normal mood and affect.  Cervix: closed, thick  Results for orders placed during the hospital encounter of 11/07/13 (from the past 24 hour(s))  URINALYSIS, ROUTINE W REFLEX MICROSCOPIC     Status: None   Collection Time    11/07/13 12:05 PM      Result Value Range   Color, Urine YELLOW  YELLOW   APPearance CLEAR  CLEAR   Specific Gravity, Urine 1.015  1.005 - 1.030   pH 6.5  5.0 - 8.0   Glucose, UA NEGATIVE  NEGATIVE mg/dL   Hgb urine dipstick NEGATIVE  NEGATIVE   Bilirubin Urine NEGATIVE  NEGATIVE   Ketones, ur NEGATIVE  NEGATIVE mg/dL   Protein, ur NEGATIVE  NEGATIVE mg/dL   Urobilinogen, UA 0.2  0.0 - 1.0 mg/dL   Nitrite NEGATIVE  NEGATIVE   Leukocytes, UA NEGATIVE  NEGATIVE  WET PREP, GENITAL     Status: Abnormal   Collection Time    11/07/13  1:15 PM      Result Value Range   Yeast Wet Prep HPF POC NONE SEEN  NONE SEEN   Trich, Wet Prep NONE SEEN  NONE SEEN   Clue Cells Wet Prep HPF POC NONE SEEN  NONE SEEN   WBC, Wet Prep HPF POC TOO NUMEROUS TO COUNT (*) NONE SEEN     MAU Course  Procedures None  MDM FHR - 153 bpm with doppler UA, Wet prep, GC/Chalmydia today  Assessment and Plan  A: Round ligament pain  P: Discharge home Recommended Tylenol PRN for pain Advised patient that warm bath/shower and abdominal binder may be helpful PRN pain Patient encouraged to follow-up with Dr. Gaynell FaceMarshall as scheduled for routine prenatal care Patient may return to MAU as needed or if her condition were to change or worsen  Freddi StarrJulie N Ethier, PA-C  11/07/2013, 1:54 PM

## 2013-11-07 NOTE — MAU Note (Signed)
Patient states she started having abdominal pain and pressure with shortness of breath last night. Denies bleeding but does have a vaginal discharge that is a little more in the am. Has vomiting off and on. Has medication but is not taking it.

## 2013-11-08 LAB — GC/CHLAMYDIA PROBE AMP
CT PROBE, AMP APTIMA: NEGATIVE
GC Probe RNA: NEGATIVE

## 2013-12-27 ENCOUNTER — Inpatient Hospital Stay (HOSPITAL_COMMUNITY)
Admission: AD | Admit: 2013-12-27 | Discharge: 2013-12-27 | Disposition: A | Discharge status: Home / Self Care | Payer: Medicaid Other | Source: Ambulatory Visit | Attending: Obstetrics | Admitting: Obstetrics

## 2013-12-27 DIAGNOSIS — O99019 Anemia complicating pregnancy, unspecified trimester: Secondary | ICD-10-CM | POA: Insufficient documentation

## 2013-12-27 DIAGNOSIS — D649 Anemia, unspecified: Secondary | ICD-10-CM | POA: Insufficient documentation

## 2013-12-27 DIAGNOSIS — K117 Disturbances of salivary secretion: Secondary | ICD-10-CM | POA: Insufficient documentation

## 2013-12-27 DIAGNOSIS — O9989 Other specified diseases and conditions complicating pregnancy, childbirth and the puerperium: Secondary | ICD-10-CM | POA: Insufficient documentation

## 2013-12-27 DIAGNOSIS — R1013 Epigastric pain: Secondary | ICD-10-CM | POA: Insufficient documentation

## 2013-12-27 DIAGNOSIS — O2650 Maternal hypotension syndrome, unspecified trimester: Secondary | ICD-10-CM | POA: Insufficient documentation

## 2013-12-27 DIAGNOSIS — O210 Mild hyperemesis gravidarum: Secondary | ICD-10-CM | POA: Insufficient documentation

## 2013-12-27 DIAGNOSIS — R55 Syncope and collapse: Secondary | ICD-10-CM

## 2013-12-27 LAB — URINALYSIS, ROUTINE W REFLEX MICROSCOPIC
Bilirubin Urine: NEGATIVE
GLUCOSE, UA: 100 mg/dL — AB
Hgb urine dipstick: NEGATIVE
KETONES UR: NEGATIVE mg/dL
LEUKOCYTES UA: NEGATIVE
Nitrite: NEGATIVE
PROTEIN: NEGATIVE mg/dL
Specific Gravity, Urine: 1.025 (ref 1.005–1.030)
UROBILINOGEN UA: 0.2 mg/dL (ref 0.0–1.0)
pH: 6 (ref 5.0–8.0)

## 2013-12-27 LAB — CBC
HCT: 30.1 % — ABNORMAL LOW (ref 36.0–46.0)
Hemoglobin: 10.1 g/dL — ABNORMAL LOW (ref 12.0–15.0)
MCH: 28.6 pg (ref 26.0–34.0)
MCHC: 33.6 g/dL (ref 30.0–36.0)
MCV: 85.3 fL (ref 78.0–100.0)
Platelets: 261 10*3/uL (ref 150–400)
RBC: 3.53 MIL/uL — ABNORMAL LOW (ref 3.87–5.11)
RDW: 13.1 % (ref 11.5–15.5)
WBC: 10.5 10*3/uL (ref 4.0–10.5)

## 2013-12-27 LAB — BASIC METABOLIC PANEL
BUN: 8 mg/dL (ref 6–23)
CO2: 23 mEq/L (ref 19–32)
CREATININE: 0.52 mg/dL (ref 0.50–1.10)
Calcium: 8.7 mg/dL (ref 8.4–10.5)
Chloride: 103 mEq/L (ref 96–112)
Glucose, Bld: 68 mg/dL — ABNORMAL LOW (ref 70–99)
POTASSIUM: 3.9 meq/L (ref 3.7–5.3)
Sodium: 136 mEq/L — ABNORMAL LOW (ref 137–147)

## 2013-12-27 NOTE — MAU Provider Note (Signed)
Chief Complaint:  Morning Sickness   First Provider Initiated Contact with Patient 12/27/13 1507      HPI: Kathy Dean is a 33 y.o. [id number] at 6146w2d who presents to maternity admissions reporting 2 or 3 day history of mid and epigastric abdominal pain immediately after eating. She also vomits every time she eats. For the past day has been feeling dizzy and weak. The dizziness was associated with feeling short of breath yesterday. Denies pain or dizziness at present. Has had hyperemesis and ptyalism throughout the pregnancy but the hyperemesis had abated about a month ago. No nausea and pain at present. Not dizzy at rest. Does not want any medications for the above symptoms. Eats raw almonds for relief of heartburn. Denies contractions, leakage of fluid or vaginal bleeding. Good fetal movement.   Pregnancy Course: hyperemesis, ptyalism. Hx PTB, not yet on 17-P  Past Medical History: Past Medical History  Diagnosis Date  . Hypertension   . Anemia   . Pregnancy induced hypertension   . Headache(784.0)   . Preterm labor   . Infection     UTI  . Abnormal Pap smear     rpt was ok    Past obstetric history: OB History  Gravida Para Term Preterm AB SAB TAB Ectopic Multiple Living  8 3 0 3 4  4   3     # Outcome Date GA Lbr Len/2nd Weight Sex Delivery Anes PTL Lv  8 CUR           7 PRE           6 PRE           5 PRE           4 TAB           3 TAB           2 TAB           1 TAB               Past Surgical History: Past Surgical History  Procedure Laterality Date  . No past surgeries       Family History: Family History  Problem Relation Age of Onset  . Other Neg Hx   . Cancer Maternal Grandfather   . Cancer Paternal Grandmother     Social History: History  Substance Use Topics  . Smoking status: Never Smoker   . Smokeless tobacco: Never Used  . Alcohol Use: 10.5 oz/week    21 drink(s) per week     Comment: -prior to + preg    Allergies:  Allergies   Allergen Reactions  . Flagyl [Metronidazole] Swelling and Dermatitis    Patient states can take gel form.   . Latex Swelling and Dermatitis    Meds:  Prescriptions prior to admission  Medication Sig Dispense Refill  . Prenatal Vit-Fe Fumarate-FA (PRENATAL MULTIVITAMIN) TABS tablet Take 1 tablet by mouth daily at 12 noon.        ROS: Pertinent findings in history of present illness.  Physical Exam  Blood pressure 107/57, pulse 78, temperature 97.9 F (36.6 C), temperature source Oral, resp. rate 16, height 5\' 2"  (1.575 m), weight 53.524 kg (118 lb), last menstrual period 07/24/2013.  Filed Vitals:   12/27/13 1444 12/27/13 1538 12/27/13 1540 12/27/13 1541  BP: 107/57 98/54 101/62 103/60  Pulse: 78 74 84 90  Temp: 97.9 F (36.6 C)     TempSrc: Oral     Resp: 16  Height:      Weight:       GENERAL: Well-developed, well-nourished female in no acute distress, spitting a lot HEENT: normocephalic HEART: normal rate RESP: normal effort ABDOMEN: Soft, non-tender, gravid appropriate for gestational age EXTREMITIES: Nontender, no edema NEURO: alert and oriented        Labs: Results for orders placed during the hospital encounter of 12/27/13 (from the past 24 hour(s))  URINALYSIS, ROUTINE W REFLEX MICROSCOPIC     Status: Abnormal   Collection Time    12/27/13  2:35 PM      Result Value Ref Range   Color, Urine YELLOW  YELLOW   APPearance CLEAR  CLEAR   Specific Gravity, Urine 1.025  1.005 - 1.030   pH 6.0  5.0 - 8.0   Glucose, UA 100 (*) NEGATIVE mg/dL   Hgb urine dipstick NEGATIVE  NEGATIVE   Bilirubin Urine NEGATIVE  NEGATIVE   Ketones, ur NEGATIVE  NEGATIVE mg/dL   Protein, ur NEGATIVE  NEGATIVE mg/dL   Urobilinogen, UA 0.2  0.0 - 1.0 mg/dL   Nitrite NEGATIVE  NEGATIVE   Leukocytes, UA NEGATIVE  NEGATIVE  CBC     Status: Abnormal   Collection Time    12/27/13  3:25 PM      Result Value Ref Range   WBC 10.5  4.0 - 10.5 K/uL   RBC 3.53 (*) 3.87 - 5.11 MIL/uL    Hemoglobin 10.1 (*) 12.0 - 15.0 g/dL   HCT 16.1 (*) 09.6 - 04.5 %   MCV 85.3  78.0 - 100.0 fL   MCH 28.6  26.0 - 34.0 pg   MCHC 33.6  30.0 - 36.0 g/dL   RDW 40.9  81.1 - 91.4 %   Platelets 261  150 - 400 K/uL  BASIC METABOLIC PANEL     Status: Abnormal   Collection Time    12/27/13  3:25 PM      Result Value Ref Range   Sodium 136 (*) 137 - 147 mEq/L   Potassium 3.9  3.7 - 5.3 mEq/L   Chloride 103  96 - 112 mEq/L   CO2 23  19 - 32 mEq/L   Glucose, Bld 68 (*) 70 - 99 mg/dL   BUN 8  6 - 23 mg/dL   Creatinine, Ser 7.82  0.50 - 1.10 mg/dL   Calcium 8.7  8.4 - 95.6 mg/dL   GFR calc non Af Amer >90  >90 mL/min   GFR calc Af Amer >90  >90 mL/min   Imaging:  No results found. MAU Course: Declined meds for nausea, vomiting, ptyalism, abdominal pain  Assessment: 1. Near syncope   2. Anemia    [id number] at 22 w2d  Plan: Discharge home with reassurance; urged to Drink plenty of fluids and take iron supplement or eat high iron foods. Dr. Gaynell Face will follow-up at visit next week if symptoms persist     Medication List         prenatal multivitamin Tabs tablet  Take 1 tablet by mouth daily at 12 noon.       Follow-up Information   Follow up with Kathreen Cosier, MD On 01/02/2014.   Specialty:  Obstetrics and Gynecology   Contact information:   [address] [place] [address] [phone]       Danae Orleans, CNM 12/27/2013 3:08 PM

## 2013-12-27 NOTE — Discharge Instructions (Signed)
Near-Syncope °Near-syncope (commonly known as near fainting) is sudden weakness, dizziness, or feeling like you might pass out. During an episode of near-syncope, you may also develop pale skin, have tunnel vision, or feel sick to your stomach (nauseous). Near-syncope may occur when getting up after sitting or while standing for a long time. It is caused by a sudden decrease in blood flow to the brain. This decrease can result from various causes or triggers, most of which are not serious. However, because near-syncope can sometimes be a sign of something serious, a medical evaluation is required. The specific cause is often not determined. °HOME CARE INSTRUCTIONS  °Monitor your condition for any changes. The following actions may help to alleviate any discomfort you are experiencing: °· Have someone stay with you until you feel stable. °· Lie down right away if you start feeling like you might faint. Breathe deeply and steadily. Wait until all the symptoms have passed. Most of these episodes last only a few minutes. You may feel tired for several hours.   °· Drink enough fluids to keep your urine clear or pale yellow.   °· If you are taking blood pressure or heart medicine, get up slowly when seated or lying down. Take several minutes to sit and then stand. This can reduce dizziness. °· Follow up with your health care provider as directed.  °SEEK IMMEDIATE MEDICAL CARE IF:  °· You have a severe headache.   °· You have unusual pain in the chest, abdomen, or back.   °· You are bleeding from the mouth or rectum, or you have black or tarry stool.   °· You have an irregular or very fast heartbeat.   °· You have repeated fainting or have seizure-like jerking during an episode.   °· You faint when sitting or lying down.   °· You have confusion.   °· You have difficulty walking.   °· You have severe weakness.   °· You have vision problems.   °MAKE SURE YOU:  °· Understand these instructions. °· Will watch your  condition. °· Will get help right away if you are not doing well or get worse. °Document Released: 09/19/2005 Document Revised: 05/22/2013 Document Reviewed: 02/22/2013 °ExitCare® Patient Information ©2014 [employer]. ° °

## 2013-12-27 NOTE — MAU Note (Signed)
States she has had hyperemesis that subsided about 1 month ago. States she has stomach pain when she eats that sometimes makes her vomit. States this pain is a "hurting type of pain." No diarrhea. Started 2 days ago. States she can eat some. C/O SOB intermittently. C/O intermittent dizziness. Currently patient is spitting.

## 2014-01-13 ENCOUNTER — Encounter (HOSPITAL_COMMUNITY): Payer: Self-pay | Admitting: *Deleted

## 2014-01-13 ENCOUNTER — Inpatient Hospital Stay (HOSPITAL_COMMUNITY)
Admission: AD | Admit: 2014-01-13 | Discharge: 2014-01-14 | Disposition: A | Discharge status: Home / Self Care | Payer: Medicaid Other | Source: Ambulatory Visit | Attending: Obstetrics | Admitting: Obstetrics

## 2014-01-13 DIAGNOSIS — O139 Gestational [pregnancy-induced] hypertension without significant proteinuria, unspecified trimester: Secondary | ICD-10-CM | POA: Insufficient documentation

## 2014-01-13 DIAGNOSIS — O4700 False labor before 37 completed weeks of gestation, unspecified trimester: Secondary | ICD-10-CM | POA: Insufficient documentation

## 2014-01-13 DIAGNOSIS — O479 False labor, unspecified: Secondary | ICD-10-CM

## 2014-01-13 DIAGNOSIS — D649 Anemia, unspecified: Secondary | ICD-10-CM | POA: Insufficient documentation

## 2014-01-13 HISTORY — DX: Mild hyperemesis gravidarum: O21.0

## 2014-01-13 HISTORY — DX: Personal history of other medical treatment: Z92.89

## 2014-01-13 LAB — URINALYSIS, ROUTINE W REFLEX MICROSCOPIC
BILIRUBIN URINE: NEGATIVE
GLUCOSE, UA: NEGATIVE mg/dL
KETONES UR: NEGATIVE mg/dL
Leukocytes, UA: NEGATIVE
Nitrite: NEGATIVE
Protein, ur: NEGATIVE mg/dL
Specific Gravity, Urine: <1.005 — ABNORMAL LOW (ref 1.005–1.030)
Urobilinogen, UA: 0.2 mg/dL (ref 0.0–1.0)
pH: 6 (ref 5.0–8.0)

## 2014-01-13 LAB — WET PREP, GENITAL
Trich, Wet Prep: NONE SEEN
Yeast Wet Prep HPF POC: NONE SEEN

## 2014-01-13 LAB — URINE MICROSCOPIC-ADD ON

## 2014-01-13 LAB — FETAL FIBRONECTIN: FETAL FIBRONECTIN: NEGATIVE

## 2014-01-13 MED ORDER — BETAMETHASONE SOD PHOS & ACET 6 (3-3) MG/ML IJ SUSP
12.0000 mg | Freq: Once | INTRAMUSCULAR | Status: AC
  Administered 2014-01-13: 12 mg via INTRAMUSCULAR
  Filled 2014-01-13: qty 2

## 2014-01-13 NOTE — MAU Provider Note (Signed)
History     CSN: [id number]  Arrival date and time: 01/13/14 2208   First Provider Initiated Contact with Patient 01/13/14 2236      Chief Complaint  Patient presents with  . Abdominal Cramping   Abdominal Cramping    Kathy Dean is a 33 y.o. [id number] at 2760w5d who presents today with painful contractions. She states that she started having contraction yesterday, but today they became more frequent and more painful. She denies any VB or LOF and confirms fetal movement. She states that she has had three prior preterm deliveries B/W 32-34 weeks. She states that has not been offered 17-P with this pregnancy.   Past Medical History  Diagnosis Date  . Hypertension   . Anemia   . Pregnancy induced hypertension   . Headache(784.0)   . Preterm labor   . Infection     UTI  . Abnormal Pap smear     rpt was ok  . History of blood transfusion   . Hyperemesis gravidarum     Past Surgical History  Procedure Laterality Date  . No past surgeries      Family History  Problem Relation Age of Onset  . Other Neg Hx   . Cancer Maternal Grandfather   . Cancer Paternal Grandmother     History  Substance Use Topics  . Smoking status: Never Smoker   . Smokeless tobacco: Never Used  . Alcohol Use: 10.5 oz/week    21 drink(s) per week     Comment: -prior to + preg    Allergies:  Allergies  Allergen Reactions  . Flagyl [Metronidazole] Swelling and Dermatitis    Patient states can take gel form.   . Latex Swelling and Dermatitis    Prescriptions prior to admission  Medication Sig Dispense Refill  . Prenatal Vit-Fe Fumarate-FA (PRENATAL MULTIVITAMIN) TABS tablet Take 1 tablet by mouth daily at 12 noon.        ROS Physical Exam   Blood pressure 105/57, pulse 86, temperature 98.6 F (37 C), temperature source Oral, resp. rate 18, height 5\' 2"  (1.575 m), weight 54.885 kg (121 lb), last menstrual period 07/24/2013.  Physical Exam  Nursing note and vitals  reviewed. Constitutional: She is oriented to person, place, and time. She appears well-developed and well-nourished. No distress.  Cardiovascular: Normal rate.   Respiratory: Effort normal.  GI: Soft. There is no tenderness.  Genitourinary:   External: no lesion Vagina: small amount of white discharge Cervix: pink, smooth, 1/thick/ballotable  Uterus: AGA   Neurological: She is alert and oriented to person, place, and time.  Skin: Skin is warm and dry.  Psychiatric: She has a normal mood and affect.   FHT: Cat I for 24 weeks Toco: irregular UI MAU Course  Procedures Results for orders placed during the hospital encounter of 01/13/14 (from the past 24 hour(s))  URINALYSIS, ROUTINE W REFLEX MICROSCOPIC     Status: Abnormal   Collection Time    01/13/14 10:25 PM      Result Value Ref Range   Color, Urine YELLOW  YELLOW   APPearance CLEAR  CLEAR   Specific Gravity, Urine <1.005 (*) 1.005 - 1.030   pH 6.0  5.0 - 8.0   Glucose, UA NEGATIVE  NEGATIVE mg/dL   Hgb urine dipstick TRACE (*) NEGATIVE   Bilirubin Urine NEGATIVE  NEGATIVE   Ketones, ur NEGATIVE  NEGATIVE mg/dL   Protein, ur NEGATIVE  NEGATIVE mg/dL   Urobilinogen, UA 0.2  0.0 - 1.0  mg/dL   Nitrite NEGATIVE  NEGATIVE   Leukocytes, UA NEGATIVE  NEGATIVE  URINE MICROSCOPIC-ADD ON     Status: None   Collection Time    01/13/14 10:25 PM      Result Value Ref Range   Squamous Epithelial / LPF RARE  RARE   WBC, UA 0-2  <3 WBC/hpf   RBC / HPF 0-2  <3 RBC/hpf   Bacteria, UA RARE  RARE  WET PREP, GENITAL     Status: Abnormal   Collection Time    01/13/14 10:43 PM      Result Value Ref Range   Yeast Wet Prep HPF POC NONE SEEN  NONE SEEN   Trich, Wet Prep NONE SEEN  NONE SEEN   Clue Cells Wet Prep HPF POC FEW (*) NONE SEEN   WBC, Wet Prep HPF POC MANY (*) NONE SEEN  FETAL FIBRONECTIN     Status: None   Collection Time    01/13/14 10:43 PM      Result Value Ref Range   Fetal Fibronectin NEGATIVE  NEGATIVE   0000:  Patient is resting comfortably and states that the pain has decreased. UI seen on toco about q 4-6 mins Cervix unchanged (1cm/thick/high) Plan to have patient return tomorrow for 2nd dose of BMX 2/2 HX of preterm delivery x 3   Assessment and Plan   1. Labor, false (Braxton-Hicks), antepartum    PTL precautions Fetal kick counts Return to MAU in 1 day for 2nd dose of BMZ FU with the office as planned  Tawnya CrookHeather Donovan Dayn Barich 01/13/2014, 10:45 PM

## 2014-01-13 NOTE — MAU Note (Signed)
Pt 24.5wks G8 P3, having lower abd cramping.  Denies bleeding or change in discharge. Hx PTD x 3.

## 2014-01-14 ENCOUNTER — Inpatient Hospital Stay (HOSPITAL_COMMUNITY)
Admission: AD | Admit: 2014-01-14 | Discharge: 2014-01-14 | Disposition: A | Discharge status: Home / Self Care | Payer: Medicaid Other | Source: Ambulatory Visit | Attending: Obstetrics | Admitting: Obstetrics

## 2014-01-14 DIAGNOSIS — O479 False labor, unspecified: Secondary | ICD-10-CM

## 2014-01-14 LAB — GC/CHLAMYDIA PROBE AMP
CT Probe RNA: NEGATIVE
GC PROBE AMP APTIMA: NEGATIVE

## 2014-01-14 MED ORDER — BETAMETHASONE SOD PHOS & ACET 6 (3-3) MG/ML IJ SUSP
12.0000 mg | Freq: Once | INTRAMUSCULAR | Status: AC
  Administered 2014-01-14: 12 mg via INTRAMUSCULAR
  Filled 2014-01-14: qty 2

## 2014-01-14 NOTE — Discharge Instructions (Signed)
Preterm Labor Information Preterm labor is when labor starts at less than 37 weeks of pregnancy. The normal length of a pregnancy is 39 to 41 weeks. CAUSES Often, there is no identifiable underlying cause as to why a woman goes into preterm labor. One of the most common known causes of preterm labor is infection. Infections of the uterus, cervix, vagina, amniotic sac, bladder, kidney, or even the lungs (pneumonia) can cause labor to start. Other suspected causes of preterm labor include:   Urogenital infections, such as yeast infections and bacterial vaginosis.   Uterine abnormalities (uterine shape, uterine septum, fibroids, or bleeding from the placenta).   A cervix that has been operated on (it may fail to stay closed).   Malformations in the fetus.   Multiple gestations (twins, triplets, and so on).   Breakage of the amniotic sac.  RISK FACTORS  Having a previous history of preterm labor.   Having premature rupture of membranes (PROM).   Having a placenta that covers the opening of the cervix (placenta previa).   Having a placenta that separates from the uterus (placental abruption).   Having a cervix that is too weak to hold the fetus in the uterus (incompetent cervix).   Having too much fluid in the amniotic sac (polyhydramnios).   Taking illegal drugs or smoking while pregnant.   Not gaining enough weight while pregnant.   Being younger than 18 and older than 35 years old.   Having a low socioeconomic status.   Being African American. SYMPTOMS Signs and symptoms of preterm labor include:   Menstrual-like cramps, abdominal pain, or back pain.  Uterine contractions that are regular, as frequent as six in an hour, regardless of their intensity (may be mild or painful).  Contractions that start on the top of the uterus and spread down to the lower abdomen and back.   A sense of increased pelvic pressure.   A watery or bloody mucus discharge that  comes from the vagina.  TREATMENT Depending on the length of the pregnancy and other circumstances, your health care provider may suggest bed rest. If necessary, there are medicines that can be given to stop contractions and to mature the fetal lungs. If labor happens before 34 weeks of pregnancy, a prolonged hospital stay may be recommended. Treatment depends on the condition of both you and the fetus.  WHAT SHOULD YOU DO IF YOU THINK YOU ARE IN PRETERM LABOR? Call your health care provider right away. You will need to go to the hospital to get checked immediately. HOW CAN YOU PREVENT PRETERM LABOR IN FUTURE PREGNANCIES? You should:   Stop smoking if you smoke.  Maintain healthy weight gain and avoid chemicals and drugs that are not necessary.  Be watchful for any type of infection.  Inform your health care provider if you have a known history of preterm labor. Document Released: 12/10/2003 Document Revised: 05/22/2013 Document Reviewed: 10/22/2012 Exit[employer] Patient Information 2014 [employer].    

## 2014-01-14 NOTE — MAU Note (Signed)
Pt presents for second BMZ injection. Denies complaints today.

## 2014-02-19 ENCOUNTER — Encounter (HOSPITAL_COMMUNITY): Payer: Self-pay | Admitting: *Deleted

## 2014-02-19 ENCOUNTER — Inpatient Hospital Stay (HOSPITAL_COMMUNITY)
Admission: AD | Admit: 2014-02-19 | Discharge: 2014-02-19 | Disposition: A | Discharge status: Home / Self Care | Payer: Medicaid Other | Source: Ambulatory Visit | Attending: Obstetrics | Admitting: Obstetrics

## 2014-02-19 DIAGNOSIS — O368190 Decreased fetal movements, unspecified trimester, not applicable or unspecified: Secondary | ICD-10-CM | POA: Insufficient documentation

## 2014-02-19 DIAGNOSIS — R109 Unspecified abdominal pain: Secondary | ICD-10-CM | POA: Insufficient documentation

## 2014-02-19 DIAGNOSIS — IMO0002 Reserved for concepts with insufficient information to code with codable children: Secondary | ICD-10-CM | POA: Insufficient documentation

## 2014-02-19 DIAGNOSIS — Y929 Unspecified place or not applicable: Secondary | ICD-10-CM | POA: Insufficient documentation

## 2014-02-19 LAB — URINALYSIS, ROUTINE W REFLEX MICROSCOPIC
Bilirubin Urine: NEGATIVE
Glucose, UA: NEGATIVE mg/dL
HGB URINE DIPSTICK: NEGATIVE
Ketones, ur: NEGATIVE mg/dL
Leukocytes, UA: NEGATIVE
NITRITE: NEGATIVE
Protein, ur: NEGATIVE mg/dL
SPECIFIC GRAVITY, URINE: 1.02 (ref 1.005–1.030)
Urobilinogen, UA: 0.2 mg/dL (ref 0.0–1.0)
pH: 7.5 (ref 5.0–8.0)

## 2014-02-19 LAB — AMNISURE RUPTURE OF MEMBRANE (ROM) NOT AT ARMC: AMNISURE: NEGATIVE
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
## 2014-02-19 NOTE — MAU Note (Signed)
Also c/o decreased fetal movement since yesterday;  +FM noted when efm applied;

## 2014-02-19 NOTE — Discharge Instructions (Signed)
Fetal Movement Counts Patient Name: __________________________________________________ Patient Due Date: ____________________ Performing a fetal movement count is highly recommended in high-risk pregnancies, but it is good for every pregnant woman to do. Your caregiver may ask you to start counting fetal movements at 28 weeks of the pregnancy. Fetal movements often increase:  After eating a full meal.  After physical activity.  After eating or drinking something sweet or cold.  At rest. Pay attention to when you feel the baby is most active. This will help you notice a pattern of your baby's sleep and wake cycles and what factors contribute to an increase in fetal movement. It is important to perform a fetal movement count at the same time each day when your baby is normally most active.  HOW TO COUNT FETAL MOVEMENTS 1. Find a quiet and comfortable area to sit or lie down on your left side. Lying on your left side provides the best blood and oxygen circulation to your baby. 2. Write down the day and time on a sheet of paper or in a journal. 3. Start counting kicks, flutters, swishes, rolls, or jabs in a 2 hour period. You should feel at least 10 movements within 2 hours. 4. If you do not feel 10 movements in 2 hours, wait 2 3 hours and count again. Look for a change in the pattern or not enough counts in 2 hours. SEEK MEDICAL CARE IF:  You feel less than 10 counts in 2 hours, tried twice.  There is no movement in over an hour.  The pattern is changing or taking longer each day to reach 10 counts in 2 hours.  You feel the baby is not moving as he or she usually does. Date: ____________ Movements: ____________ Start time: ____________ Finish time: ____________  Date: ____________ Movements: ____________ Start time: ____________ Finish time: ____________ Date: ____________ Movements: ____________ Start time: ____________ Finish time: ____________ Date: ____________ Movements: ____________  Start time: ____________ Finish time: ____________ Date: ____________ Movements: ____________ Start time: ____________ Finish time: ____________ Date: ____________ Movements: ____________ Start time: ____________ Finish time: ____________ Date: ____________ Movements: ____________ Start time: ____________ Finish time: ____________ Date: ____________ Movements: ____________ Start time: ____________ Finish time: ____________  Date: ____________ Movements: ____________ Start time: ____________ Finish time: ____________ Date: ____________ Movements: ____________ Start time: ____________ Finish time: ____________ Date: ____________ Movements: ____________ Start time: ____________ Finish time: ____________ Date: ____________ Movements: ____________ Start time: ____________ Finish time: ____________ Date: ____________ Movements: ____________ Start time: ____________ Finish time: ____________ Date: ____________ Movements: ____________ Start time: ____________ Finish time: ____________ Date: ____________ Movements: ____________ Start time: ____________ Finish time: ____________  Date: ____________ Movements: ____________ Start time: ____________ Finish time: ____________ Date: ____________ Movements: ____________ Start time: ____________ Finish time: ____________ Date: ____________ Movements: ____________ Start time: ____________ Finish time: ____________ Date: ____________ Movements: ____________ Start time: ____________ Finish time: ____________ Date: ____________ Movements: ____________ Start time: ____________ Finish time: ____________ Date: ____________ Movements: ____________ Start time: ____________ Finish time: ____________ Date: ____________ Movements: ____________ Start time: ____________ Finish time: ____________  Date: ____________ Movements: ____________ Start time: ____________ Finish time: ____________ Date: ____________ Movements: ____________ Start time: ____________ Finish time:  ____________ Date: ____________ Movements: ____________ Start time: ____________ Finish time: ____________ Date: ____________ Movements: ____________ Start time: ____________ Finish time: ____________ Date: ____________ Movements: ____________ Start time: ____________ Finish time: ____________ Date: ____________ Movements: ____________ Start time: ____________ Finish time: ____________ Date: ____________ Movements: ____________ Start time: ____________ Finish time: ____________  Date: ____________ Movements: ____________ Start time: ____________ Finish   time: ____________ Date: ____________ Movements: ____________ Start time: ____________ Finish time: ____________ Date: ____________ Movements: ____________ Start time: ____________ Finish time: ____________ Date: ____________ Movements: ____________ Start time: ____________ Finish time: ____________ Date: ____________ Movements: ____________ Start time: ____________ Finish time: ____________ Date: ____________ Movements: ____________ Start time: ____________ Finish time: ____________ Date: ____________ Movements: ____________ Start time: ____________ Finish time: ____________  Date: ____________ Movements: ____________ Start time: ____________ Finish time: ____________ Date: ____________ Movements: ____________ Start time: ____________ Finish time: ____________ Date: ____________ Movements: ____________ Start time: ____________ Finish time: ____________ Date: ____________ Movements: ____________ Start time: ____________ Finish time: ____________ Date: ____________ Movements: ____________ Start time: ____________ Finish time: ____________ Date: ____________ Movements: ____________ Start time: ____________ Finish time: ____________ Date: ____________ Movements: ____________ Start time: ____________ Finish time: ____________  Date: ____________ Movements: ____________ Start time: ____________ Finish time: ____________ Date: ____________ Movements:  ____________ Start time: ____________ Finish time: ____________ Date: ____________ Movements: ____________ Start time: ____________ Finish time: ____________ Date: ____________ Movements: ____________ Start time: ____________ Finish time: ____________ Date: ____________ Movements: ____________ Start time: ____________ Finish time: ____________ Date: ____________ Movements: ____________ Start time: ____________ Finish time: ____________ Date: ____________ Movements: ____________ Start time: ____________ Finish time: ____________  Date: ____________ Movements: ____________ Start time: ____________ Finish time: ____________ Date: ____________ Movements: ____________ Start time: ____________ Finish time: ____________ Date: ____________ Movements: ____________ Start time: ____________ Finish time: ____________ Date: ____________ Movements: ____________ Start time: ____________ Finish time: ____________ Date: ____________ Movements: ____________ Start time: ____________ Finish time: ____________ Date: ____________ Movements: ____________ Start time: ____________ Finish time: ____________ Document Released: 10/19/2006 Document Revised: 09/05/2012 Document Reviewed: 07/16/2012 [employer] Patient Information 2014 [employer].  
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
## 2014-03-21 ENCOUNTER — Encounter (HOSPITAL_COMMUNITY): Payer: Self-pay | Admitting: *Deleted

## 2014-03-21 ENCOUNTER — Inpatient Hospital Stay (HOSPITAL_COMMUNITY)
Admission: AD | Admit: 2014-03-21 | Discharge: 2014-03-21 | Disposition: A | Discharge status: Home / Self Care | Payer: Medicaid Other | Source: Ambulatory Visit | Attending: Obstetrics | Admitting: Obstetrics

## 2014-03-21 DIAGNOSIS — O99891 Other specified diseases and conditions complicating pregnancy: Secondary | ICD-10-CM | POA: Insufficient documentation

## 2014-03-21 DIAGNOSIS — R079 Chest pain, unspecified: Secondary | ICD-10-CM | POA: Insufficient documentation

## 2014-03-21 DIAGNOSIS — O99013 Anemia complicating pregnancy, third trimester: Secondary | ICD-10-CM

## 2014-03-21 DIAGNOSIS — O99019 Anemia complicating pregnancy, unspecified trimester: Secondary | ICD-10-CM | POA: Insufficient documentation

## 2014-03-21 DIAGNOSIS — R0789 Other chest pain: Secondary | ICD-10-CM

## 2014-03-21 DIAGNOSIS — R42 Dizziness and giddiness: Secondary | ICD-10-CM | POA: Insufficient documentation

## 2014-03-21 DIAGNOSIS — R0602 Shortness of breath: Secondary | ICD-10-CM | POA: Insufficient documentation

## 2014-03-21 DIAGNOSIS — O9989 Other specified diseases and conditions complicating pregnancy, childbirth and the puerperium: Secondary | ICD-10-CM

## 2014-03-21 DIAGNOSIS — R55 Syncope and collapse: Secondary | ICD-10-CM

## 2014-03-21 DIAGNOSIS — O2650 Maternal hypotension syndrome, unspecified trimester: Secondary | ICD-10-CM | POA: Insufficient documentation

## 2014-03-21 DIAGNOSIS — D649 Anemia, unspecified: Secondary | ICD-10-CM | POA: Insufficient documentation

## 2014-03-21 LAB — CBC
HEMATOCRIT: 28.7 % — AB (ref 36.0–46.0)
Hemoglobin: 9.2 g/dL — ABNORMAL LOW (ref 12.0–15.0)
MCH: 25.9 pg — ABNORMAL LOW (ref 26.0–34.0)
MCHC: 32.1 g/dL (ref 30.0–36.0)
MCV: 80.8 fL (ref 78.0–100.0)
PLATELETS: 221 10*3/uL (ref 150–400)
RBC: 3.55 MIL/uL — ABNORMAL LOW (ref 3.87–5.11)
RDW: 13.8 % (ref 11.5–15.5)
WBC: 10.3 10*3/uL (ref 4.0–10.5)

## 2014-03-21 LAB — COMPREHENSIVE METABOLIC PANEL
ALT: 14 U/L (ref 0–35)
AST: 17 U/L (ref 0–37)
Albumin: 2.5 g/dL — ABNORMAL LOW (ref 3.5–5.2)
Alkaline Phosphatase: 87 U/L (ref 39–117)
BILIRUBIN TOTAL: 0.4 mg/dL (ref 0.3–1.2)
BUN: 9 mg/dL (ref 6–23)
CO2: 20 mEq/L (ref 19–32)
Calcium: 8.5 mg/dL (ref 8.4–10.5)
Chloride: 102 mEq/L (ref 96–112)
Creatinine, Ser: 0.52 mg/dL (ref 0.50–1.10)
GFR calc Af Amer: >90 mL/min (ref >90)
GFR calc non Af Amer: >90 mL/min (ref >90)
Glucose, Bld: 94 mg/dL (ref 70–99)
Potassium: 3.8 mEq/L (ref 3.7–5.3)
SODIUM: 134 meq/L — AB (ref 137–147)
TOTAL PROTEIN: 6 g/dL (ref 6.0–8.3)

## 2014-03-21 LAB — URINALYSIS, ROUTINE W REFLEX MICROSCOPIC
Bilirubin Urine: NEGATIVE
Glucose, UA: NEGATIVE mg/dL
Hgb urine dipstick: NEGATIVE
KETONES UR: NEGATIVE mg/dL
LEUKOCYTES UA: NEGATIVE
NITRITE: NEGATIVE
PROTEIN: NEGATIVE mg/dL
Specific Gravity, Urine: 1.015 (ref 1.005–1.030)
Urobilinogen, UA: 0.2 mg/dL (ref 0.0–1.0)
pH: 7 (ref 5.0–8.0)

## 2014-03-21 MED ORDER — INTEGRA 62.5-62.5-40-3 MG PO CAPS: 1.0000 | ORAL_CAPSULE | Freq: Every day | ORAL | Status: DC

## 2014-03-21 NOTE — MAU Provider Note (Signed)
Chief Complaint:  Dizziness, Shortness of Breath, Chest Pain and Abdominal Pain   First Provider Initiated Contact with Patient 03/21/14 1223      HPI: Kathy Dean is a 33 y.o. [id number] at 3527w2d who presents to maternity admissions reporting episodes of dizziness, vision going black, chest pain and shortness of breath.  The episodes started 3 days ago and at first she thought it was the heat outside or dehydration.  Then, 2 days ago she stayed indoors, drank plenty of water, but the episodes continued to happen throughout the day.  During each episode, she notices her vision narrowing in and then going completely black.  She has not passed out or fallen during the episodes.  Then, she becomes short of breath and her chest hurts.  When the episode finishes, she does not have pain or other symptoms.  She has an appointment with Dr Gaynell FaceMarshall on Monday to be evaluated but was concerned so she came to the MAU this morning.  She also reports irregular contractions today, sometimes as many as 6-7/hour but sometimes less.  She has hx of preterm birth x3.  She also reports history of anemia with blood transfusion during one of her pregnancies.  She reports good fetal movement, denies LOF, vaginal bleeding, vaginal itching/burning, urinary symptoms, h/a,n/v, or fever/chills.    Past Medical History: Past Medical History  Diagnosis Date  . Hypertension   . Anemia   . Pregnancy induced hypertension   . Headache(784.0)   . Preterm labor   . Infection     UTI  . Abnormal Pap smear     rpt was ok  . History of blood transfusion   . Hyperemesis gravidarum     Past obstetric history: OB History  Gravida Para Term Preterm AB SAB TAB Ectopic Multiple Living  7 3 0 3 3  3   3     # Outcome Date GA Lbr Len/2nd Weight Sex Delivery Anes PTL Lv  7 CUR           6 PRE           5 PRE           4 PRE           3 TAB           2 TAB           1 TAB               Past Surgical History: Past Surgical  History  Procedure Laterality Date  . No past surgeries    . Induced abortion      Family History: Family History  Problem Relation Age of Onset  . Other Neg Hx   . Cancer Maternal Grandfather   . Cancer Paternal Grandmother     Social History: History  Substance Use Topics  . Smoking status: Never Smoker   . Smokeless tobacco: Never Used  . Alcohol Use: 10.5 oz/week    21 drink(s) per week     Comment: -prior to + preg    Allergies:  Allergies  Allergen Reactions  . Flagyl [Metronidazole] Swelling and Dermatitis    Patient states can take gel form.   . Latex Swelling and Dermatitis    Meds:  Prescriptions prior to admission  Medication Sig Dispense Refill  . Prenatal Vit-Fe Fumarate-FA (PRENATAL MULTIVITAMIN) TABS tablet Take 1 tablet by mouth daily at 12 noon.        ROS: Pertinent findings  in history of present illness.  Physical Exam  Blood pressure 121/65, pulse 83, temperature 98.5 F (36.9 C), temperature source Oral, resp. rate 16, height 5' 3.25" (1.607 m), weight 61.145 kg (134 lb 12.8 oz), last menstrual period 07/24/2013, SpO2 97.00%. GENERAL: Well-developed, well-nourished female in no acute distress.  HEENT: normocephalic HEART: normal rate RESP: normal effort ABDOMEN: Soft, non-tender, gravid appropriate for gestational age EXTREMITIES: Nontender, no edema NEURO: alert and oriented  Dilation: 3 Effacement (%): 50 Station: -3 Presentation: Vertex Exam by:: L. Leftwich-Kirby CNM  FHT:  Baseline 135 , moderate variability, accelerations present, no decelerations Contractions: irregular, Q3-10 minutes   EKG: normal sinus rhythm, nonspecific ST and T waves changes . Labs: Results for orders placed during the hospital encounter of 03/21/14 (from the past 24 hour(s))  URINALYSIS, ROUTINE W REFLEX MICROSCOPIC     Status: None   Collection Time    03/21/14 11:55 AM      Result Value Ref Range   Color, Urine YELLOW  YELLOW   APPearance CLEAR   CLEAR   Specific Gravity, Urine 1.015  1.005 - 1.030   pH 7.0  5.0 - 8.0   Glucose, UA NEGATIVE  NEGATIVE mg/dL   Hgb urine dipstick NEGATIVE  NEGATIVE   Bilirubin Urine NEGATIVE  NEGATIVE   Ketones, ur NEGATIVE  NEGATIVE mg/dL   Protein, ur NEGATIVE  NEGATIVE mg/dL   Urobilinogen, UA 0.2  0.0 - 1.0 mg/dL   Nitrite NEGATIVE  NEGATIVE   Leukocytes, UA NEGATIVE  NEGATIVE  CBC     Status: Abnormal   Collection Time    03/21/14 12:53 PM      Result Value Ref Range   WBC 10.3  4.0 - 10.5 K/uL   RBC 3.55 (*) 3.87 - 5.11 MIL/uL   Hemoglobin 9.2 (*) 12.0 - 15.0 g/dL   HCT 96.0 (*) 45.4 - 09.8 %   MCV 80.8  78.0 - 100.0 fL   MCH 25.9 (*) 26.0 - 34.0 pg   MCHC 32.1  30.0 - 36.0 g/dL   RDW 11.9  14.7 - 82.9 %   Platelets 221  150 - 400 K/uL  COMPREHENSIVE METABOLIC PANEL     Status: Abnormal   Collection Time    03/21/14 12:53 PM      Result Value Ref Range   Sodium 134 (*) 137 - 147 mEq/L   Potassium 3.8  3.7 - 5.3 mEq/L   Chloride 102  96 - 112 mEq/L   CO2 20  19 - 32 mEq/L   Glucose, Bld 94  70 - 99 mg/dL   BUN 9  6 - 23 mg/dL   Creatinine, Ser 5.62  0.50 - 1.10 mg/dL   Calcium 8.5  8.4 - 13.0 mg/dL   Total Protein 6.0  6.0 - 8.3 g/dL   Albumin 2.5 (*) 3.5 - 5.2 g/dL   AST 17  0 - 37 U/L   ALT 14  0 - 35 U/L   Alkaline Phosphatase 87  39 - 117 U/L   Total Bilirubin 0.4  0.3 - 1.2 mg/dL   GFR calc non Af Amer >90  >90 mL/min   GFR calc Af Amer >90  >90 mL/min    Assessment: 1. Near syncope   2. Chest pain of unknown etiology   3. SOB (shortness of breath)   4. Anemia affecting pregnancy in third trimester     Plan: Consult Cardiology for EKG.  Per Dr Algie Coffer, pt to call his office Monday  to be seen for f/u.   Consult Dr Gaynell FaceMarshall Discharge home PTL precautions and fetal kick counts Integra prescription to pt pharmacy for iron replacement F/U with Dr Gaynell FaceMarshall as scheduled      Follow-up Information   Follow up with Kathreen CosierMARSHALL,BERNARD A, MD. (As scheduled)     Specialty:  Obstetrics and Gynecology   Contact information:   [address] [place] [address] [phone]       Call Ricki RodriguezAjay S. Kadakia, MD. (To make appointment on Monday. )    Contact information:   [address] [place] [id number] [phone]      Follow [facility] with [facility]. (As needed for emergencies)    Contact information:   [address] [id number] [place] [address] [phone]       Medication List         INTEGRA 62.5-62.5-40-3 MG Caps  Take 1 capsule by mouth daily.     prenatal multivitamin Tabs tablet  Take 1 tablet by mouth daily at 12 noon.        Sharen CounterLisa Leftwich-Kirby Certified Nurse-Midwife 03/21/2014 3:43 PM

## 2014-03-21 NOTE — Discharge Instructions (Signed)
Near-Syncope Near-syncope (commonly known as near fainting) is sudden weakness, dizziness, or feeling like you might pass out. During an episode of near-syncope, you may also develop pale skin, have tunnel vision, or feel sick to your stomach (nauseous). Near-syncope may occur when getting up after sitting or while standing for a long time. It is caused by a sudden decrease in blood flow to the brain. This decrease can result from various causes or triggers, most of which are not serious. However, because near-syncope can sometimes be a sign of something serious, a medical evaluation is required. The specific cause is often not determined. HOME CARE INSTRUCTIONS  Monitor your condition for any changes. The following actions may help to alleviate any discomfort you are experiencing:  Have someone stay with you until you feel stable.  Lie down right away and prop your feet up if you start feeling like you might faint. Breathe deeply and steadily. Wait until all the symptoms have passed. Most of these episodes last only a few minutes. You may feel tired for several hours.   Drink enough fluids to keep your urine clear or pale yellow.   If you are taking blood pressure or heart medicine, get up slowly when seated or lying down. Take several minutes to sit and then stand. This can reduce dizziness.  Follow up with your health care provider as directed. SEEK IMMEDIATE MEDICAL CARE IF:   You have a severe headache.   You have unusual pain in the chest, abdomen, or back.   You are bleeding from the mouth or rectum, or you have black or tarry stool.   You have an irregular or very fast heartbeat.   You have repeated fainting or have seizure-like jerking during an episode.   You faint when sitting or lying down.   You have confusion.   You have difficulty walking.   You have severe weakness.   You have vision problems.  MAKE SURE YOU:   Understand these instructions.  Will  watch your condition.  Will get help right away if you are not doing well or get worse. Document Released: 09/19/2005 Document Revised: 09/24/2013 Document Reviewed: 02/22/2013 [facility] Patient Information 2015 [place], [place]. This information is not intended to replace advice given to you by your health care provider. Make sure you discuss any questions you have with your health care provider. Pregnancy and Anemia Anemia is a condition in which the concentration of red blood cells or hemoglobin in the blood is below normal. Hemoglobin is a substance in red blood cells that carries oxygen to the tissues of the body. Anemia results in not enough oxygen reaching these tissues.  Anemia during pregnancy is common because the fetus uses more iron and folic acid as it is developing. Your body may not produce enough red blood cells because of this. Also, during pregnancy, the liquid part of the blood (plasma) increases by about 50%, and the red blood cells increase by only 25%. This lowers the concentration of the red blood cells and creates a natural anemia-like situation.  CAUSES  The most common cause of anemia during pregnancy is not having enough iron in the body to make red blood cells (iron deficiency anemia). Other causes may include:  Folic acid deficiency.  Vitamin B12 deficiency.  Certain prescription or over-the-counter medicines.  Certain medical conditions or infections that destroy red blood cells.  A low platelet count and bleeding caused by antibodies that go through the placenta to the fetus from the  mother's blood. SIGNS AND SYMPTOMS  Mild anemia may not be noticeable. If it becomes severe, symptoms may include:  Tiredness.  Shortness of breath, especially with exercise.  Weakness.  Fainting.  Pale looking skin.  Headaches.  Feeling a fast or irregular heartbeat (palpitations). DIAGNOSIS  The type of anemia is usually diagnosed from your family and medical history  and blood tests. TREATMENT  Treatment of anemia during pregnancy depends on the cause of the anemia. Treatment can include:  Supplements of iron, vitamin B12, or folic acid.  A blood transfusion. This may be needed if blood loss is severe.  Hospitalization. This may be needed if there is significant continual blood loss.  Dietary changes. HOME CARE INSTRUCTIONS   Follow your dietitian's or health care provider's dietary recommendations.  Increase your vitamin C intake. This will help the stomach absorb more iron.  Eat a diet rich in iron. This would include foods such as:  Liver.  Beef.  Whole grain bread.  Eggs.  Dried fruit.  Take iron and vitamins as directed by your health care provider.  Eat green leafy vegetables. These are a good source of folic acid. SEEK MEDICAL CARE IF:   You have frequent or lasting headaches.  You are looking pale.  You are bruising easily. SEEK IMMEDIATE MEDICAL CARE IF:   You have extreme weakness, shortness of breath, or chest pain.  You become dizzy or have trouble concentrating.  You have heavy vaginal bleeding.  You develop a rash.  You have bloody or black, tarry stools.  You faint.  You vomit up blood.  You vomit repeatedly.  You have abdominal pain.  You have a fever or persistent symptoms for more than 2-3 days.  You have a fever and your symptoms suddenly get worse.  You are dehydrated. MAKE SURE YOU:   Understand these instructions.  Will watch your condition.  Will get help right away if you are not doing well or get worse. Document Released: 09/16/2000 Document Revised: 07/10/2013 Document Reviewed: 05/01/2013 [facility]Exit[facility] Patient Information 2015 [place], [place]. This information is not intended to replace advice given to you by your health care provider. Make sure you discuss any questions you have with your health care provider.

## 2014-03-21 NOTE — MAU Note (Signed)
Pt states she has episodes of dizziness & losing her vision every day since Wednesday, multiple times a day.  Denies losing consciousness.

## 2014-03-21 NOTE — MAU Note (Signed)
Patient states that over the past couple of days she has had periods of feeling dizzy and "blacking out". When she is feeling like this she will have shortness of breath and see spots. Has been having mild abdominal contractions. Denies bleeding or leaking. Reports good fetal movement yesterday but a little less today. Has a vaginal discharge all the time.

## 2014-04-03 ENCOUNTER — Inpatient Hospital Stay (HOSPITAL_COMMUNITY)
Admission: AD | Admit: 2014-04-03 | Discharge: 2014-04-03 | Disposition: A | Discharge status: Home / Self Care | Payer: Medicaid Other | Source: Ambulatory Visit | Attending: Obstetrics | Admitting: Obstetrics

## 2014-04-03 ENCOUNTER — Encounter (HOSPITAL_COMMUNITY): Payer: Self-pay | Admitting: *Deleted

## 2014-04-03 DIAGNOSIS — O4700 False labor before 37 completed weeks of gestation, unspecified trimester: Secondary | ICD-10-CM | POA: Insufficient documentation

## 2014-04-03 DIAGNOSIS — O4703 False labor before 37 completed weeks of gestation, third trimester: Secondary | ICD-10-CM

## 2014-04-03 DIAGNOSIS — Z331 Pregnant state, incidental: Secondary | ICD-10-CM

## 2014-04-03 DIAGNOSIS — Z711 Person with feared health complaint in whom no diagnosis is made: Secondary | ICD-10-CM

## 2014-04-03 DIAGNOSIS — O99019 Anemia complicating pregnancy, unspecified trimester: Secondary | ICD-10-CM | POA: Insufficient documentation

## 2014-04-03 DIAGNOSIS — O10019 Pre-existing essential hypertension complicating pregnancy, unspecified trimester: Secondary | ICD-10-CM | POA: Insufficient documentation

## 2014-04-03 DIAGNOSIS — D649 Anemia, unspecified: Secondary | ICD-10-CM | POA: Insufficient documentation

## 2014-04-03 LAB — URINALYSIS, ROUTINE W REFLEX MICROSCOPIC
BILIRUBIN URINE: NEGATIVE
GLUCOSE, UA: 100 mg/dL — AB
HGB URINE DIPSTICK: NEGATIVE
KETONES UR: NEGATIVE mg/dL
LEUKOCYTES UA: NEGATIVE
Nitrite: NEGATIVE
PH: 6.5 (ref 5.0–8.0)
PROTEIN: NEGATIVE mg/dL
Specific Gravity, Urine: 1.02 (ref 1.005–1.030)
Urobilinogen, UA: 0.2 mg/dL (ref 0.0–1.0)

## 2014-04-03 NOTE — MAU Provider Note (Signed)
Chief Complaint:  Abdominal Pain   First Provider Initiated Contact with Patient 04/03/14 1736      HPI: Kathy Dean is a 33 y.o. [id number] at [id number] presents to maternity admissions reporting irregular contractions which are unchanged in last 2 weeks with an episode today of visually seeing a "pulse" or heartbeat in her abdomen lasting 1.5 hours.  She is concerned that this pulse could be something wrong with the baby.  The pulse is not visible now.  She reports good fetal movement, denies LOF, vaginal bleeding, vaginal itching/burning, urinary symptoms, h/a, dizziness, n/v, or fever/chills.     Past Medical History: Past Medical History  Diagnosis Date  . Hypertension   . Anemia   . Pregnancy induced hypertension   . Headache(784.0)   . Preterm labor   . Infection     UTI  . Abnormal Pap smear     rpt was ok  . History of blood transfusion   . Hyperemesis gravidarum     Past obstetric history: OB History  Gravida Para Term Preterm AB SAB TAB Ectopic Multiple Living  7 3 0 3 3  3   3     # Outcome Date GA Lbr Len/2nd Weight Sex Delivery Anes PTL Lv  7 CUR           6 PRE           5 PRE           4 PRE           3 TAB           2 TAB           1 TAB               Past Surgical History: Past Surgical History  Procedure Laterality Date  . No past surgeries    . Induced abortion      Family History: Family History  Problem Relation Age of Onset  . Other Neg Hx   . Cancer Maternal Grandfather   . Cancer Paternal Grandmother     Social History: History  Substance Use Topics  . Smoking status: Never Smoker   . Smokeless tobacco: Never Used  . Alcohol Use: 10.5 oz/week    21 drink(s) per week     Comment: -prior to + preg    Allergies:  Allergies  Allergen Reactions  . Flagyl [Metronidazole] Swelling and Dermatitis    Patient states can take gel form.   . Latex Swelling and Dermatitis    Meds:  Prescriptions prior to admission  Medication Sig  Dispense Refill  . Fe Fum-FePoly-Vit C-Vit B3 (INTEGRA) 62.5-62.5-40-3 MG CAPS Take 1 capsule by mouth daily.  30 capsule  5  . Prenatal Vit-Fe Fumarate-FA (PRENATAL MULTIVITAMIN) TABS tablet Take 1 tablet by mouth daily at 12 noon.        ROS: Pertinent findings in history of present illness.  Physical Exam  Last menstrual period 07/24/2013. GENERAL: Well-developed, well-nourished female in no acute distress.  HEENT: normocephalic HEART: normal rate RESP: normal effort ABDOMEN: Soft, non-tender, gravid appropriate for gestational age, pt pulse palpable near umbilicus, in location she indicated seeing the unusual heartbeat EXTREMITIES: Nontender, no edema NEURO: alert and oriented  Dilation: 3 Effacement (%): 50 Cervical Position: Posterior Station: -3 Presentation: Vertex Exam by:: L.Leftwich-Kirby,CNM Cervix unchanged from previous exam  FHT:  Baseline 135 , moderate variability, accelerations present, no decelerations Contractions: rare, mild to palpation   Labs: Results  for orders placed during the hospital encounter of 04/03/14 (from the past 24 hour(s))  URINALYSIS, ROUTINE W REFLEX MICROSCOPIC     Status: Abnormal   Collection Time    04/03/14  5:09 PM      Result Value Ref Range   Color, Urine YELLOW  YELLOW   APPearance CLEAR  CLEAR   Specific Gravity, Urine 1.020  1.005 - 1.030   pH 6.5  5.0 - 8.0   Glucose, UA 100 (*) NEGATIVE mg/dL   Hgb urine dipstick NEGATIVE  NEGATIVE   Bilirubin Urine NEGATIVE  NEGATIVE   Ketones, ur NEGATIVE  NEGATIVE mg/dL   Protein, ur NEGATIVE  NEGATIVE mg/dL   Urobilinogen, UA 0.2  0.0 - 1.0 mg/dL   Nitrite NEGATIVE  NEGATIVE   Leukocytes, UA NEGATIVE  NEGATIVE     Assessment: 1. Concern about pregnancy complication without diagnosis   2. Threatened preterm labor, third trimester     Plan: Discharge home Labor precautions and fetal kick counts  Follow-up Information   Follow up with Kathreen CosierMARSHALL,BERNARD A, MD. (As scheduled  on Monday)    Specialty:  Obstetrics and Gynecology   Contact information:   [address] [place] [address] [phone]       [facility] with [facility]. (As needed for emergencies)    Contact information:   [address] [id number] [place] [address] [phone]       Medication List         INTEGRA 62.5-62.5-40-3 MG Caps  Take 1 capsule by mouth daily.     prenatal multivitamin Tabs tablet  Take 1 tablet by mouth daily at 12 noon.        Sharen CounterLisa Leftwich-Kirby Certified Nurse-Midwife 04/03/2014 5:53 PM

## 2014-04-03 NOTE — Discharge Instructions (Signed)

## 2014-04-03 NOTE — MAU Note (Signed)
Pt reports she has been having ctx on and off but that is not why she came. She stated she  could palpate the baby movement and she stated it felt fast like a heart beat. It started like hiccups but got faster lasted for about 1hr . Pt was not sure if was normal but she had never felt that before.

## 2014-04-24 ENCOUNTER — Encounter (HOSPITAL_COMMUNITY): Payer: Medicaid Other | Admitting: Anesthesiology

## 2014-04-24 ENCOUNTER — Encounter (HOSPITAL_COMMUNITY): Payer: Self-pay | Admitting: *Deleted

## 2014-04-24 ENCOUNTER — Inpatient Hospital Stay (HOSPITAL_COMMUNITY): Payer: Medicaid Other | Admitting: Anesthesiology

## 2014-04-24 ENCOUNTER — Inpatient Hospital Stay (HOSPITAL_COMMUNITY)
Admission: AD | Admit: 2014-04-24 | Discharge: 2014-04-26 | DRG: 774 | Disposition: A | Discharge status: Home / Self Care | Payer: Medicaid Other | Source: Ambulatory Visit | Attending: Obstetrics | Admitting: Obstetrics

## 2014-04-24 DIAGNOSIS — O1002 Pre-existing essential hypertension complicating childbirth: Principal | ICD-10-CM | POA: Diagnosis present

## 2014-04-24 DIAGNOSIS — IMO0001 Reserved for inherently not codable concepts without codable children: Secondary | ICD-10-CM

## 2014-04-24 DIAGNOSIS — O479 False labor, unspecified: Secondary | ICD-10-CM | POA: Diagnosis present

## 2014-04-24 LAB — RPR

## 2014-04-24 LAB — CBC
HCT: 31.1 % — ABNORMAL LOW (ref 36.0–46.0)
HEMOGLOBIN: 9.8 g/dL — AB (ref 12.0–15.0)
MCH: 24.7 pg — AB (ref 26.0–34.0)
MCHC: 31.5 g/dL (ref 30.0–36.0)
MCV: 78.3 fL (ref 78.0–100.0)
PLATELETS: 217 10*3/uL (ref 150–400)
RBC: 3.97 MIL/uL (ref 3.87–5.11)
RDW: 14.9 % (ref 11.5–15.5)
WBC: 8.3 10*3/uL (ref 4.0–10.5)

## 2014-04-24 LAB — OB RESULTS CONSOLE GBS: GBS: NEGATIVE

## 2014-04-24 MED ORDER — LACTATED RINGERS IV SOLN: 500.0000 mL | INTRAVENOUS | Status: DC | PRN

## 2014-04-24 MED ORDER — FENTANYL 2.5 MCG/ML BUPIVACAINE 1/10 % EPIDURAL INFUSION (WH - ANES)
14.0000 mL/h | INTRAMUSCULAR | Status: DC | PRN
  Administered 2014-04-24: 14 mL/h via EPIDURAL

## 2014-04-24 MED ORDER — LIDOCAINE HCL (PF) 1 % IJ SOLN
30.0000 mL | INTRAMUSCULAR | Status: DC | PRN
  Filled 2014-04-24: qty 30

## 2014-04-24 MED ORDER — DIBUCAINE 1 % RE OINT
1.0000 | TOPICAL_OINTMENT | RECTAL | Status: DC | PRN
Start: 2014-04-24 — End: 2014-04-26
  Administered 2014-04-26: 1 via RECTAL
  Filled 2014-04-24 (×2): qty 28

## 2014-04-24 MED ORDER — PENICILLIN G POTASSIUM 5000000 UNITS IJ SOLR
2.5000 10*6.[IU] | INTRAMUSCULAR | Status: DC
  Filled 2014-04-24 (×3): qty 2.5

## 2014-04-24 MED ORDER — SIMETHICONE 80 MG PO CHEW: 80.0000 mg | CHEWABLE_TABLET | ORAL | Status: DC | PRN

## 2014-04-24 MED ORDER — EPHEDRINE 5 MG/ML INJ
10.0000 mg | INTRAVENOUS | Status: DC | PRN
  Filled 2014-04-24: qty 2

## 2014-04-24 MED ORDER — CITRIC ACID-SODIUM CITRATE 334-500 MG/5ML PO SOLN: 30.0000 mL | ORAL | Status: DC | PRN

## 2014-04-24 MED ORDER — LACTATED RINGERS IV SOLN
500.0000 mL | Freq: Once | INTRAVENOUS | Status: DC
Start: 2014-04-24 — End: 2014-04-24

## 2014-04-24 MED ORDER — PRENATAL MULTIVITAMIN CH: 1.0000 | ORAL_TABLET | Freq: Every day | ORAL | Status: DC

## 2014-04-24 MED ORDER — PHENYLEPHRINE 40 MCG/ML (10ML) SYRINGE FOR IV PUSH (FOR BLOOD PRESSURE SUPPORT)
80.0000 ug | PREFILLED_SYRINGE | INTRAVENOUS | Status: DC | PRN
  Filled 2014-04-24: qty 2

## 2014-04-24 MED ORDER — FENTANYL 2.5 MCG/ML BUPIVACAINE 1/10 % EPIDURAL INFUSION (WH - ANES)
INTRAMUSCULAR | Status: AC
  Administered 2014-04-24: 14 mL/h via EPIDURAL
  Filled 2014-04-24: qty 125

## 2014-04-24 MED ORDER — LACTATED RINGERS IV SOLN
INTRAVENOUS | Status: DC
  Administered 2014-04-24 (×2): via INTRAVENOUS

## 2014-04-24 MED ORDER — FERROUS SULFATE 325 (65 FE) MG PO TABS
325.0000 mg | ORAL_TABLET | Freq: Two times a day (BID) | ORAL | Status: DC
  Administered 2014-04-25: 325 mg via ORAL
  Filled 2014-04-24 (×2): qty 1

## 2014-04-24 MED ORDER — ACETAMINOPHEN 325 MG PO TABS: 650.0000 mg | ORAL_TABLET | ORAL | Status: DC | PRN

## 2014-04-24 MED ORDER — ONDANSETRON HCL 4 MG/2ML IJ SOLN: 4.0000 mg | INTRAMUSCULAR | Status: DC | PRN

## 2014-04-24 MED ORDER — WITCH HAZEL-GLYCERIN EX PADS: 1.0000 "application " | MEDICATED_PAD | CUTANEOUS | Status: DC | PRN

## 2014-04-24 MED ORDER — OXYTOCIN 40 UNITS IN LACTATED RINGERS INFUSION - SIMPLE MED
62.5000 mL/h | INTRAVENOUS | Status: DC
  Administered 2014-04-24: 999 mL/h via INTRAVENOUS
  Filled 2014-04-24: qty 1000

## 2014-04-24 MED ORDER — ONDANSETRON HCL 4 MG/2ML IJ SOLN: 4.0000 mg | Freq: Four times a day (QID) | INTRAMUSCULAR | Status: DC | PRN

## 2014-04-24 MED ORDER — ZOLPIDEM TARTRATE 5 MG PO TABS: 5.0000 mg | ORAL_TABLET | Freq: Every evening | ORAL | Status: DC | PRN

## 2014-04-24 MED ORDER — LACTATED RINGERS IV BOLUS (SEPSIS)
1000.0000 mL | Freq: Once | INTRAVENOUS | Status: AC
  Administered 2014-04-24: 1000 mL via INTRAVENOUS

## 2014-04-24 MED ORDER — OXYCODONE-ACETAMINOPHEN 5-325 MG PO TABS: 1.0000 | ORAL_TABLET | ORAL | Status: DC | PRN

## 2014-04-24 MED ORDER — DIPHENHYDRAMINE HCL 25 MG PO CAPS: 25.0000 mg | ORAL_CAPSULE | Freq: Four times a day (QID) | ORAL | Status: DC | PRN

## 2014-04-24 MED ORDER — LIDOCAINE HCL (PF) 1 % IJ SOLN
INTRAMUSCULAR | Status: DC | PRN
  Administered 2014-04-24 (×2): 5 mL

## 2014-04-24 MED ORDER — OXYTOCIN BOLUS FROM INFUSION: 500.0000 mL | INTRAVENOUS | Status: DC

## 2014-04-24 MED ORDER — IBUPROFEN 600 MG PO TABS: 600.0000 mg | ORAL_TABLET | Freq: Four times a day (QID) | ORAL | Status: DC | PRN

## 2014-04-24 MED ORDER — BENZOCAINE-MENTHOL 20-0.5 % EX AERO
1.0000 "application " | INHALATION_SPRAY | CUTANEOUS | Status: DC | PRN
  Filled 2014-04-24: qty 56

## 2014-04-24 MED ORDER — IBUPROFEN 600 MG PO TABS
600.0000 mg | ORAL_TABLET | Freq: Four times a day (QID) | ORAL | Status: DC
  Filled 2014-04-24 (×2): qty 1

## 2014-04-24 MED ORDER — TETANUS-DIPHTH-ACELL PERTUSSIS 5-2.5-18.5 LF-MCG/0.5 IM SUSP
0.5000 mL | Freq: Once | INTRAMUSCULAR | Status: DC
Start: 2014-04-25 — End: 2014-04-26

## 2014-04-24 MED ORDER — DIPHENHYDRAMINE HCL 50 MG/ML IJ SOLN: 12.5000 mg | INTRAMUSCULAR | Status: DC | PRN

## 2014-04-24 MED ORDER — PENICILLIN G POTASSIUM 5000000 UNITS IJ SOLR
5.0000 10*6.[IU] | Freq: Once | INTRAVENOUS | Status: DC
  Filled 2014-04-24: qty 5

## 2014-04-24 MED ORDER — ONDANSETRON HCL 4 MG PO TABS: 4.0000 mg | ORAL_TABLET | ORAL | Status: DC | PRN

## 2014-04-24 MED ORDER — BUTORPHANOL TARTRATE 1 MG/ML IJ SOLN: 1.0000 mg | INTRAMUSCULAR | Status: DC | PRN

## 2014-04-24 MED ORDER — LANOLIN HYDROUS EX OINT: TOPICAL_OINTMENT | CUTANEOUS | Status: DC | PRN

## 2014-04-24 MED ORDER — PHENYLEPHRINE 40 MCG/ML (10ML) SYRINGE FOR IV PUSH (FOR BLOOD PRESSURE SUPPORT)
PREFILLED_SYRINGE | INTRAVENOUS | Status: AC
  Filled 2014-04-24: qty 10

## 2014-04-24 MED ORDER — SENNOSIDES-DOCUSATE SODIUM 8.6-50 MG PO TABS
2.0000 | ORAL_TABLET | ORAL | Status: DC
  Administered 2014-04-25: 2 via ORAL
  Filled 2014-04-24: qty 2

## 2014-04-24 NOTE — H&P (Signed)
Kathy Dean is a 33 y.o. female presenting for UC's. Maternal Medical History:  Reason for admission: Contractions.   Fetal activity: Perceived fetal activity is normal.   Last perceived fetal movement was within the past hour.    Prenatal complications: no prenatal complications Prenatal Complications - Diabetes: none.    OB History   Grav Para Term Preterm Abortions TAB SAB Ect Mult Living   7 3 0 3 3 3    3      Past Medical History  Diagnosis Date  . Hypertension   . Anemia   . Pregnancy induced hypertension   . Headache(784.0)   . Preterm labor   . Infection     UTI  . Abnormal Pap smear     rpt was ok  . History of blood transfusion   . Hyperemesis gravidarum    Past Surgical History  Procedure Laterality Date  . No past surgeries    . Induced abortion     Family History: family history includes Cancer in her maternal grandfather and paternal grandmother. There is no history of Other. Social History:  reports that she has never smoked. She has never used smokeless tobacco. She reports that she drinks about 10.5 ounces of alcohol per week. She reports that she does not use illicit drugs.   Prenatal Transfer Tool  Maternal Diabetes: No Genetic Screening: Normal Maternal Ultrasounds/Referrals: Normal Fetal Ultrasounds or other Referrals:  None Maternal Substance Abuse:  No Significant Maternal Medications:  None Significant Maternal Lab Results:  None Other Comments:  None  Review of Systems  All other systems reviewed and are negative.   Dilation: 4 Effacement (%): 80 Station: -2 Exam by:: Weston,RN Blood pressure 125/76, pulse 94, temperature 98.6 F (37 C), temperature source Oral, resp. rate 16, height 5\' 2"  (1.575 m), weight 141 lb (63.957 kg), last menstrual period 07/24/2013, SpO2 100.00%. Maternal Exam:  Abdomen: Patient reports no abdominal tenderness. Fetal presentation: vertex  Pelvis: adequate for delivery.   Cervix: Cervix evaluated by  digital exam.     Physical Exam  Nursing note and vitals reviewed. Constitutional: She is oriented to person, place, and time. She appears well-developed and well-nourished.  HENT:  Head: Normocephalic and atraumatic.  Eyes: Conjunctivae are normal. Pupils are equal, round, and reactive to light.  Neck: Normal range of motion. Neck supple.  Cardiovascular: Normal rate and regular rhythm.   Respiratory: Effort normal.  GI: Soft.  Musculoskeletal: Normal range of motion.  Neurological: She is alert and oriented to person, place, and time.  Skin: Skin is warm and dry.  Psychiatric: She has a normal mood and affect. Her behavior is normal. Judgment and thought content normal.    Prenatal labs: ABO, Rh: B/Positive/-- (01/12 0000) Antibody: Negative (01/12 0000) Rubella: Immune (01/12 0000) RPR: Nonreactive (01/12 0000)  HBsAg: Negative (01/12 0000)  HIV: Non-reactive (01/12 0000)  GBS:     Assessment/Plan: 39 weeks.  Early labor.  Admit.   Kathy Dean A 04/24/2014, 3:27 AM

## 2014-04-24 NOTE — MAU Note (Signed)
Contractions  

## 2014-04-24 NOTE — H&P (Signed)
This is Dr. Francoise CeoBernard Marshall dictating the history and physical on  Kathy Dean she's a 33 year old gravida 7 para 0333 at 39 weeks and a day negative GBS she was admitted in labor she has no 4 cm 90% vertex -1 in labor membranes intact no problems with this pregnancy Past medical history negative Past surgical history negative Social history negative System review negative Physical exam well-developed female in labor HEENT negative Lungs clear to P&A Heart regular rhythm no murmurs no gallops Breasts negative  Abdomen term Pelvic as described above extremities negative

## 2014-04-24 NOTE — Anesthesia Procedure Notes (Signed)
Epidural Patient location during procedure: OB Start time: 04/24/2014 12:21 PM  Staffing Anesthesiologist: Brayton CavesJACKSON, Hermen Mario Performed by: anesthesiologist   Preanesthetic Checklist Completed: patient identified, site marked, surgical consent, pre-op evaluation, timeout performed, IV checked, risks and benefits discussed and monitors and equipment checked  Epidural Patient position: sitting Prep: site prepped and draped and DuraPrep Patient monitoring: continuous pulse ox and blood pressure Approach: midline Location: L3-L4 Injection technique: LOR air  Needle:  Needle type: Tuohy  Needle gauge: 17 G Needle length: 9 cm and 9 Needle insertion depth: 4 cm Catheter type: closed end flexible Catheter size: 19 Gauge Catheter at skin depth: 9 cm Test dose: negative  Assessment Events: blood not aspirated, injection not painful, no injection resistance, negative IV test and no paresthesia  Additional Notes Patient identified.  Risk benefits discussed including failed block, incomplete pain control, headache, nerve damage, paralysis, blood pressure changes, nausea, vomiting, reactions to medication both toxic or allergic, and postpartum back pain.  Patient expressed understanding and wished to proceed.  All questions were answered.  Sterile technique used throughout procedure and epidural site dressed with sterile barrier dressing. No paresthesia or other complications noted.The patient did not experience any signs of intravascular injection such as tinnitus or metallic taste in mouth nor signs of intrathecal spread such as rapid motor block. Please see nursing notes for vital signs.

## 2014-04-24 NOTE — Anesthesia Preprocedure Evaluation (Signed)
Anesthesia Evaluation  Patient identified by MRN, date of birth, ID band Patient awake    Reviewed: Allergy & Precautions, H&P , Patient's Chart, lab work & pertinent test results  Airway Mallampati: II TM Distance: >3 FB Neck ROM: full    Dental   Pulmonary  breath sounds clear to auscultation        Cardiovascular hypertension, Rhythm:regular Rate:Normal     Neuro/Psych    GI/Hepatic   Endo/Other    Renal/GU      Musculoskeletal   Abdominal   Peds  Hematology   Anesthesia Other Findings   Reproductive/Obstetrics (+) Pregnancy                           Anesthesia Physical Anesthesia Plan  ASA: II  Anesthesia Plan: Epidural   Post-op Pain Management:    Induction:   Airway Management Planned:   Additional Equipment:   Intra-op Plan:   Post-operative Plan:   Informed Consent: I have reviewed the patients History and Physical, chart, labs and discussed the procedure including the risks, benefits and alternatives for the proposed anesthesia with the patient or authorized representative who has indicated his/her understanding and acceptance.     Plan Discussed with:   Anesthesia Plan Comments:         Anesthesia Quick Evaluation  

## 2014-04-24 NOTE — Progress Notes (Signed)
Patient reports GBS is negative and is refusing antibiotics.  Will notify Dr Gaynell FaceMarshall for office to find GBS status.

## 2014-04-25 LAB — CBC
HCT: 27.9 % — ABNORMAL LOW (ref 36.0–46.0)
Hemoglobin: 8.8 g/dL — ABNORMAL LOW (ref 12.0–15.0)
MCH: 24.2 pg — AB (ref 26.0–34.0)
MCHC: 31.5 g/dL (ref 30.0–36.0)
MCV: 76.9 fL — AB (ref 78.0–100.0)
PLATELETS: 208 10*3/uL (ref 150–400)
RBC: 3.63 MIL/uL — ABNORMAL LOW (ref 3.87–5.11)
RDW: 14.8 % (ref 11.5–15.5)
WBC: 11.4 10*3/uL — AB (ref 4.0–10.5)

## 2014-04-25 NOTE — Progress Notes (Signed)
Patient ID: Kathy BaronCyatta C Dean, female   DOB: July 22, 1981, 33 y.o.   MRN: [id number] Postpartum day one Vital signs normal Fundus firm Lochia moderate Legs negative and

## 2014-04-25 NOTE — Progress Notes (Signed)
UR chart review completed.  

## 2014-04-25 NOTE — Anesthesia Postprocedure Evaluation (Signed)
Anesthesia Post Note  Patient: Kathy Dean  Procedure(s) Performed: * No procedures listed *  Anesthesia type: Epidural  Patient location: Mother/Baby  Post pain: Pain level controlled  Post assessment: Post-op Vital signs reviewed  Last Vitals:  Filed Vitals:   04/25/14 0830  BP: 110/51  Pulse: 82  Temp: 36.9 C  Resp: 16    Post vital signs: Reviewed  Level of consciousness:alert  Complications: No apparent anesthesia complications

## 2014-04-25 NOTE — Lactation Note (Signed)
This note was copied from the chart of Kathy Colie Dean. Lactation Consultation Note F/U on mom d/t nipple pain. Noted everted nipples w/cracks at the top base of each nipple. End of nipples intact. Pt. Is rubbing colostrum on nipples. Gave comfort gels. Fitted for #20 NS, it fitted snug, #24 felt better to mom. Baby latch w/o difficulty. Encouraged to do gentle chin tug if needed.  Encouraged cheek to breast and good head support and body positioning. States it feels much better. Noted lots of colostrum in NS.  BF oldest daughter for 8-9 months. Patient Name: Kathy Lynn ItoCyatta Wileman [id number]'s Date: 04/25/2014 Reason for consult: Follow-up assessment;Breast/nipple pain   Maternal Data    Feeding Feeding Type: Breast Fed Length of feed: 15 min  LATCH Score/Interventions Latch: Repeated attempts needed to sustain latch, nipple held in mouth throughout feeding, stimulation needed to elicit sucking reflex. Intervention(s): Adjust position;Assist with latch;Breast massage;Breast compression  Audible Swallowing: A few with stimulation Intervention(s): Skin to skin;Hand expression;Alternate breast massage  Type of Nipple: Everted at rest and after stimulation  Comfort (Breast/Nipple): Filling, red/small blisters or bruises, mild/mod discomfort  Problem noted: Cracked, bleeding, blisters, bruises Interventions  (Cracked/bleeding/bruising/blister): Expressed breast milk to nipple Interventions (Mild/moderate discomfort): Hand massage;Hand expression;Comfort gels;Breast shields  Hold (Positioning): Assistance needed to correctly position infant at breast and maintain latch. Intervention(s): Breastfeeding basics reviewed;Support Pillows;Position options;Skin to skin  LATCH Score: 6  Lactation Tools Discussed/Used Tools: Shells;Nipple Shields;Comfort gels Nipple shield size: 24 Shell Type: Inverted   Consult Status Consult Status: Follow-up Date: 04/25/14 Follow-up type:  In-patient    Maximum Reiland, Diamond NickelLAURA G 04/25/2014, 2:00 PM

## 2014-04-26 MED ORDER — NORETHINDRONE 0.35 MG PO TABS: 1.0000 | ORAL_TABLET | Freq: Every day | ORAL | Status: DC

## 2014-04-26 NOTE — Discharge Instructions (Signed)

## 2014-04-26 NOTE — Discharge Summary (Signed)
  Obstetric Discharge Summary Reason for Admission: onset of labor Prenatal Procedures: none Intrapartum Procedures: spontaneous vaginal delivery Postpartum Procedures: none Complications-Operative and Postpartum: none  Hemoglobin  Date Value Ref Range Status  04/25/2014 8.8* 12.0 - 15.0 g/dL Final     HCT  Date Value Ref Range Status  04/25/2014 27.9* 36.0 - 46.0 % Final    Physical Exam:  General: alert Lochia: appropriate Uterine: firm Incision: n/a DVT Evaluation: No evidence of DVT seen on physical exam.  Discharge Diagnoses: Active Problems:   Active labor at term   NVD (normal vaginal delivery)   Discharge Information: Date: 04/26/2014 Activity: pelvic rest Diet: routine Medications:  Prior to Admission medications   Medication Sig Start Date End Date Taking? Authorizing Provider  Fe Fum-FePoly-Vit C-Vit B3 (INTEGRA) 62.5-62.5-40-3 MG CAPS Take 1 capsule by mouth daily. 03/21/14  Yes Hurshel PartyLisa A Leftwich-Kirby, CNM  Prenatal Vit-Fe Fumarate-FA (PRENATAL MULTIVITAMIN) TABS tablet Take 1 tablet by mouth daily at 12 noon.   Yes Historical Provider, MD  norethindrone (ORTHO MICRONOR) 0.35 MG tablet Take 1 tablet (0.35 mg total) by mouth daily. 04/26/14   Antionette CharLisa Jackson-Moore, MD    Condition: stable Instructions: refer to routine discharge instructions Discharge to: home Follow-up Information   Follow up with MARSHALL,BERNARD A, MD. Schedule an appointment as soon as possible for a visit in 2 weeks.   Specialty:  Obstetrics and Gynecology   Contact information:   [address] [place] [address] [phone]       Newborn Data:  Live born female  Birth Weight: 7 lb 4.8 oz (3310 g) APGAR: 9, 9   Home with mother.  JACKSON-MOORE,Yaviel Kloster A 04/26/2014, 11:24 AM

## 2014-08-04 ENCOUNTER — Encounter (HOSPITAL_COMMUNITY): Payer: Self-pay | Admitting: *Deleted

## 2015-06-24 ENCOUNTER — Emergency Department (HOSPITAL_COMMUNITY)
Admission: EM | Admit: 2015-06-24 | Discharge: 2015-06-24 | Disposition: A | Discharge status: Home / Self Care | Payer: Medicaid Other | Attending: Emergency Medicine | Admitting: Emergency Medicine

## 2015-06-24 ENCOUNTER — Encounter (HOSPITAL_COMMUNITY): Payer: Self-pay | Admitting: *Deleted

## 2015-06-24 DIAGNOSIS — Z8751 Personal history of pre-term labor: Secondary | ICD-10-CM | POA: Diagnosis not present

## 2015-06-24 DIAGNOSIS — I10 Essential (primary) hypertension: Secondary | ICD-10-CM | POA: Insufficient documentation

## 2015-06-24 DIAGNOSIS — Z79899 Other long term (current) drug therapy: Secondary | ICD-10-CM | POA: Diagnosis not present

## 2015-06-24 DIAGNOSIS — D649 Anemia, unspecified: Secondary | ICD-10-CM | POA: Insufficient documentation

## 2015-06-24 DIAGNOSIS — H6122 Impacted cerumen, left ear: Secondary | ICD-10-CM | POA: Insufficient documentation

## 2015-06-24 DIAGNOSIS — Z87440 Personal history of urinary (tract) infections: Secondary | ICD-10-CM | POA: Insufficient documentation

## 2015-06-24 DIAGNOSIS — H9203 Otalgia, bilateral: Secondary | ICD-10-CM | POA: Diagnosis present

## 2015-06-24 NOTE — ED Provider Notes (Signed)
CSN: [id number]     Arrival date & time 06/24/15  1131 History  This chart was scribed for non-physician practitioner, Eyvonne Mechanic, PA-C working with Melene Plan, DO by Gwenyth Ober, ED scribe. This patient was seen in room TR11C/TR11C and the patient's care was started at 12:00 PM   Chief Complaint  Patient presents with  . Otalgia   The history is provided by the patient. No language interpreter was used.   HPI Comments: Kathy Dean is a 34 y.o. female who presents to the Emergency Department complaining of constant, moderate left ear pain that started this morning.She states that symptoms have come and gone over the last month but persisted today. She states muffled hearing in her left ear as an associated symptom. Pt also notes an intermittent "fluttering" sound in her left ear that occurred with yawning and started 6 days ago. Pt reports that she has been using Q-tips to clean her left ear. She denies fever, chills, URI symptoms and neck stiffness as associated symptoms.   Past Medical History  Diagnosis Date  . Hypertension   . Anemia   . Pregnancy induced hypertension   . Headache(784.0)   . Preterm labor   . Infection     UTI  . Abnormal Pap smear     rpt was ok  . History of blood transfusion   . Hyperemesis gravidarum    Past Surgical History  Procedure Laterality Date  . No past surgeries    . Induced abortion     Family History  Problem Relation Age of Onset  . Other Neg Hx   . Cancer Maternal Grandfather   . Cancer Paternal Grandmother    Social History  Substance Use Topics  . Smoking status: Never Smoker   . Smokeless tobacco: Never Used  . Alcohol Use: 10.5 oz/week    21 drink(s) per week     Comment: -prior to + preg   OB History    Gravida Para Term Preterm AB TAB SAB Ectopic Multiple Living   Review of Systems  All other systems reviewed and are negative.  Allergies  Flagyl and Latex  Home Medications   Prior to  Admission medications   Medication Sig Start Date End Date Taking? Authorizing Provider  Fe Fum-FePoly-Vit C-Vit B3 (INTEGRA) 62.5-62.5-40-3 MG CAPS Take 1 capsule by mouth daily. 03/21/14   Lisa A Leftwich-Kirby, CNM  norethindrone (ORTHO MICRONOR) 0.35 MG tablet Take 1 tablet (0.35 mg total) by mouth daily. 04/26/14   Antionette Char, MD  Prenatal Vit-Fe Fumarate-FA (PRENATAL MULTIVITAMIN) TABS tablet Take 1 tablet by mouth daily at 12 noon.    Historical Provider, MD   BP 132/79 mmHg  Pulse 67  Temp(Src) 98.3 F (36.8 C)  Resp 14  Ht  (1.575 m)  Wt 121 lb (54.885 kg)  BMI 22.13 kg/m2  SpO2 100%   Physical Exam  Constitutional: She appears well-developed and well-nourished. No distress.  HENT:  Head: Normocephalic and atraumatic.  Cerumen impaction of left ear without signs of infection. Mastoid process non tender, no pain with manipulation of the tragus  Eyes: Conjunctivae and EOM are normal.  Neck: Neck supple. No tracheal deviation present.  Cardiovascular: Normal rate.   Pulmonary/Chest: Effort normal. No respiratory distress.  Skin: Skin is warm and dry.  Psychiatric: She has a normal mood and affect. Her behavior is normal.  Nursing note and vitals reviewed.  ED Course  Procedures   DIAGNOSTIC STUDIES: Oxygen Saturation is 100% on RA, normal by my interpretation.    COORDINATION OF CARE: 12:08 PM Attempted removal of ear wax. Discussed treatment plan with pt which includes ear wax softening drops. Pt agreed to plan.  MDM   Final diagnoses:  Cerumen impaction, left   Labs:    Imaging:   Consults:   Therapeutics: Removal of wax from left ear   Discharge Meds:   Assessment/Plan: Discussed cerumen impaction with pt, without signs of infection. Advised pt to use ear wax softening drops and follow-up with [facility]. Pt told not to use Q-tips.   I personally performed the services described in this documentation, which was scribed in my  presence. The recorded information has been reviewed and is accurate.   Eyvonne Mechanic, PA-C 06/25/15 1405  Melene Plan, DO 06/26/15 4540

## 2015-06-24 NOTE — Discharge Instructions (Signed)
Ear Drops You need to put eardrops in your ear. HOME CARE   Put drops in your affected ear as told.  After putting in the drops, lie down with the ear you put the drops in facing up. Stay this way for 10 minutes. Use the ear drops as long as your doctor tells you.  Before you get up, put a cotton ball gently in your ear. Do not push it far in your ear.  Do not wash out your ears unless your doctor says it is okay.  Finish all medicines as told by your doctor. You may be told to keep using the eardrops even if you start to feel better.  See your doctor as told for follow-up visits. GET HELP IF:  You have pain that gets worse.  Any unusual fluid (drainage) is coming from your ear (especially if the fluid stinks).  You have trouble hearing.  You get really dizzy as if the room is spinning and feel sick to your stomach (vertigo).  The outside of your ear becomes red or puffy or both. This may be a sign of an allergic reaction. MAKE SURE YOU:   Understand these instructions.  Will watch your condition.  Will get help right away if you are not doing well or get worse. Document Released: 03/09/2010 Document Revised: 09/24/2013 Document Reviewed: 04/16/2013 [facility] Patient Information 2015 [place], [place]. This information is not intended to replace advice given to you by your health care provider. Make sure you discuss any questions you have with your health care provider.  Cerumen Impaction A cerumen impaction is when the wax in your ear forms a plug. This plug usually causes reduced hearing. Sometimes it also causes an earache or dizziness. Removing a cerumen impaction can be difficult and painful. The wax sticks to the ear canal. The canal is sensitive and bleeds easily. If you try to remove a heavy wax buildup with a cotton tipped swab, you may push it in further. Irrigation with water, suction, and small ear curettes may be used to clear out the wax. If the impaction is fixed to  the skin in the ear canal, ear drops may be needed for a few days to loosen the wax. People who build up a lot of wax frequently can use ear wax removal products available in your local drugstore. SEEK MEDICAL CARE IF:  You develop an earache, increased hearing loss, or marked dizziness. Document Released: 10/27/2004 Document Revised: 12/12/2011 Document Reviewed: 12/17/2009 [facility] Patient Information 2015 [place], [place]. This information is not intended to replace advice given to you by your health care provider. Make sure you discuss any questions you have with your health care provider.  Please use your accepting drops as needed for cerumen impaction. His follow-up with primary care for reevaluation.

## 2015-06-24 NOTE — ED Notes (Signed)
Pt reports left ear pain and some blood noted on a qtip. Pt states that she has had muffled hearing as well.

## 2016-01-23 ENCOUNTER — Inpatient Hospital Stay (HOSPITAL_COMMUNITY)
Admission: AD | Admit: 2016-01-23 | Discharge: 2016-01-23 | Disposition: A | Discharge status: Home / Self Care | Payer: Medicaid Other | Source: Ambulatory Visit | Attending: Obstetrics | Admitting: Obstetrics

## 2016-01-23 ENCOUNTER — Encounter (HOSPITAL_COMMUNITY): Payer: Self-pay | Admitting: *Deleted

## 2016-01-23 DIAGNOSIS — B9689 Other specified bacterial agents as the cause of diseases classified elsewhere: Secondary | ICD-10-CM

## 2016-01-23 DIAGNOSIS — N760 Acute vaginitis: Secondary | ICD-10-CM | POA: Diagnosis present

## 2016-01-23 DIAGNOSIS — N390 Urinary tract infection, site not specified: Secondary | ICD-10-CM | POA: Diagnosis not present

## 2016-01-23 DIAGNOSIS — R102 Pelvic and perineal pain: Secondary | ICD-10-CM

## 2016-01-23 LAB — URINALYSIS, ROUTINE W REFLEX MICROSCOPIC
BILIRUBIN URINE: NEGATIVE
GLUCOSE, UA: NEGATIVE mg/dL
HGB URINE DIPSTICK: NEGATIVE
Ketones, ur: NEGATIVE mg/dL
Nitrite: NEGATIVE
PH: 7 (ref 5.0–8.0)
Protein, ur: NEGATIVE mg/dL
SPECIFIC GRAVITY, URINE: 1.015 (ref 1.005–1.030)

## 2016-01-23 LAB — WET PREP, GENITAL
Sperm: NONE SEEN
Trich, Wet Prep: NONE SEEN
Yeast Wet Prep HPF POC: NONE SEEN

## 2016-01-23 LAB — URINE MICROSCOPIC-ADD ON: RBC / HPF: NONE SEEN RBC/hpf (ref 0–5)

## 2016-01-23 LAB — POCT PREGNANCY, URINE: Preg Test, Ur: NEGATIVE

## 2016-01-23 MED ORDER — CLINDAMYCIN HCL 300 MG PO CAPS: 300.0000 mg | ORAL_CAPSULE | Freq: Two times a day (BID) | ORAL | Status: DC

## 2016-01-23 MED ORDER — SULFAMETHOXAZOLE-TRIMETHOPRIM 800-160 MG PO TABS: 1.0000 | ORAL_TABLET | Freq: Two times a day (BID) | ORAL | Status: AC

## 2016-01-23 MED ORDER — METRONIDAZOLE 0.75 % VA GEL: 1.0000 | Freq: Every day | VAGINAL | Status: DC

## 2016-01-23 NOTE — MAU Provider Note (Signed)
History     CSN: [id number]  Arrival date and time: 01/23/16 [id number]   None     Chief Complaint  Patient presents with  . Abdominal Pain   HPI   [name] is a 35 y.o. female [phone] non pregnant female presenting to MAU with abdominal pain. The pain is located in both lower quadrants of her abdomen and in lower-middle part of her abdomen.   The Abdominal pain that woke her up out of her sleep this morning, the pain started this morning. She has not taken anything for the pain. She was seen by Dr. Gaynell FaceMarshall recently and had negative STI testing.   OB History    Gravida Para Term Preterm AB TAB SAB Ectopic Multiple Living   7 4 1 3 3 3    4       Past Medical History  Diagnosis Date  . Hypertension   . Anemia   . Pregnancy induced hypertension   . Headache(784.0)   . Preterm labor   . Infection     UTI  . Abnormal Pap smear     rpt was ok  . History of blood transfusion   . Hyperemesis gravidarum     Past Surgical History  Procedure Laterality Date  . No past surgeries    . Induced abortion      Family History  Problem Relation Age of Onset  . Other Neg Hx   . Cancer Maternal Grandfather   . Cancer Paternal Grandmother     Social History  Substance Use Topics  . Smoking status: Never Smoker   . Smokeless tobacco: Never Used  . Alcohol Use: 10.5 oz/week    21 drink(s) per week     Comment: -prior to + preg    Allergies:  Allergies  Allergen Reactions  . Flagyl [Metronidazole] Swelling and Dermatitis    Patient states can take gel form.   . Latex Swelling and Dermatitis    Prescriptions prior to admission  Medication Sig Dispense Refill Last Dose  . etonogestrel-ethinyl estradiol (NUVARING) 0.12-0.015 MG/24HR vaginal ring Place 1 each vaginally every 28 (twenty-eight) days. Insert vaginally and leave in place for 3 consecutive weeks, then remove for 1 week.   01/19/2016  . Fe Fum-FePoly-Vit C-Vit B3 (INTEGRA) 62.5-62.5-40-3 MG CAPS Take 1  capsule by mouth daily. (Patient not taking: Reported on 01/23/2016) 30 capsule 5 Past Week at Unknown time  . norethindrone (ORTHO MICRONOR) 0.35 MG tablet Take 1 tablet (0.35 mg total) by mouth daily. (Patient not taking: Reported on 01/23/2016) 28 tablet 11    Results for orders placed or performed during the hospital encounter of 01/23/16 (from the past 48 hour(s))  Urinalysis, Routine w reflex microscopic (not at [facility])     Status: Abnormal   Collection Time: 01/23/16  7:40 AM  Result Value Ref Range   Color, Urine YELLOW YELLOW   APPearance CLEAR CLEAR   Specific Gravity, Urine 1.015 1.005 - 1.030   pH 7.0 5.0 - 8.0   Glucose, UA NEGATIVE NEGATIVE mg/dL   Hgb urine dipstick NEGATIVE NEGATIVE   Bilirubin Urine NEGATIVE NEGATIVE   Ketones, ur NEGATIVE NEGATIVE mg/dL   Protein, ur NEGATIVE NEGATIVE mg/dL   Nitrite NEGATIVE NEGATIVE   Leukocytes, UA SMALL (A) NEGATIVE  Urine microscopic-add on     Status: Abnormal   Collection Time: 01/23/16  7:40 AM  Result Value Ref Range   Squamous Epithelial / LPF 0-5 (A) NONE SEEN   WBC, UA 6-30  0 - 5 WBC/hpf   RBC / HPF NONE SEEN 0 - 5 RBC/hpf   Bacteria, UA RARE (A) NONE SEEN  Pregnancy, urine POC     Status: None   Collection Time: 01/23/16  7:45 AM  Result Value Ref Range   Preg Test, Ur NEGATIVE NEGATIVE    Comment:        THE SENSITIVITY OF THIS METHODOLOGY IS >24 mIU/mL   Wet prep, genital     Status: Abnormal   Collection Time: 01/23/16  8:40 AM  Result Value Ref Range   Yeast Wet Prep HPF POC NONE SEEN NONE SEEN   Trich, Wet Prep NONE SEEN NONE SEEN   Clue Cells Wet Prep HPF POC PRESENT (A) NONE SEEN   WBC, Wet Prep HPF POC MANY (A) NONE SEEN    Comment: MODERATE BACTERIA SEEN   Sperm NONE SEEN     Review of Systems  Constitutional: Negative for fever and chills.  Gastrointestinal: Positive for abdominal pain (Bilateral lower abdominal pain. ).  Genitourinary: Positive for dysuria. Negative for urgency, frequency, hematuria  and flank pain.  Musculoskeletal: Negative for back pain.   Physical Exam   Blood pressure 136/90, pulse 78, temperature 98.3 F (36.8 C), resp. rate 16, height 5' 2.5" (1.588 m), weight 125 lb (56.7 kg), last menstrual period 01/17/2016, unknown if currently breastfeeding.  Physical Exam  Constitutional: She is oriented to person, place, and time. She appears well-developed and well-nourished. No distress.  HENT:  Head: Normocephalic.  Eyes: Pupils are equal, round, and reactive to light.  Respiratory: Effort normal.  GI: Soft. She exhibits no distension and no mass. There is no tenderness. There is no rebound and no guarding.  Genitourinary:  Speculum exam: Vagina - Small amount of creamy, yellow discharge, no odor Cervix - No contact bleeding Bimanual exam: Cervix closed, no CMT Uterus non tender, normal size Adnexa non tender, no masses bilaterally GC/Chlam, wet prep done Chaperone present for exam.  Musculoskeletal: Normal range of motion.  Neurological: She is alert and oriented to person, place, and time.  Skin: Skin is warm. She is not diaphoretic.  Psychiatric: Her behavior is normal.    MAU Course  Procedures  None  MDM  UA Wet prep GC   Assessment and Plan   A:  1. BV (bacterial vaginosis)   2. Pelvic pain in female   3. Urinary tract infection, site not specified     P:  Discharge home in stable condition RX: Metrol gel, bactrim Discussed possibility of PID, although unlikely due to absence of CMT. If symptoms worsen despite treatment of BV, UTI patient to follow up with Dr. Gaynell Face.  Return to MAU for emergencies.    Duane Lope, NP 01/23/2016 5:17 PM

## 2016-01-23 NOTE — MAU Note (Signed)
Pt presents to MAU with complaints of lower abdominal cramping with a yellowish vaginal discharge. No change in sexual partners

## 2016-01-23 NOTE — Discharge Instructions (Signed)
Bacterial Vaginosis °Bacterial vaginosis is a vaginal infection that occurs when the normal balance of bacteria in the vagina is disrupted. It results from an overgrowth of certain bacteria. This is the most common vaginal infection in women of childbearing age. Treatment is important to prevent complications, especially in pregnant women, as it can cause a premature delivery. °CAUSES  °Bacterial vaginosis is caused by an increase in harmful bacteria that are normally present in smaller amounts in the vagina. Several different kinds of bacteria can cause bacterial vaginosis. However, the reason that the condition develops is not fully understood. °RISK FACTORS °Certain activities or behaviors can put you at an increased risk of developing bacterial vaginosis, including: °· Having a new sex partner or multiple sex partners. °· Douching. °· Using an intrauterine device (IUD) for contraception. °Women do not get bacterial vaginosis from toilet seats, bedding, swimming pools, or contact with objects around them. °SIGNS AND SYMPTOMS  °Some women with bacterial vaginosis have no signs or symptoms. Common symptoms include: °· Grey vaginal discharge. °· A fishlike odor with discharge, especially after sexual intercourse. °· Itching or burning of the vagina and vulva. °· Burning or pain with urination. °DIAGNOSIS  °Your health care provider will take a medical history and examine the vagina for signs of bacterial vaginosis. A sample of vaginal fluid may be taken. Your health care provider will look at this sample under a microscope to check for bacteria and abnormal cells. A vaginal pH test may also be done.  °TREATMENT  °Bacterial vaginosis may be treated with antibiotic medicines. These may be given in the form of a pill or a vaginal cream. A second round of antibiotics may be prescribed if the condition comes back after treatment. Because bacterial vaginosis increases your risk for sexually transmitted diseases, getting  treated can help reduce your risk for chlamydia, gonorrhea, HIV, and herpes. °HOME CARE INSTRUCTIONS  °· Only take over-the-counter or prescription medicines as directed by your health care provider. °· If antibiotic medicine was prescribed, take it as directed. Make sure you finish it even if you start to feel better. °· Tell all sexual partners that you have a vaginal infection. They should see their health care provider and be treated if they have problems, such as a mild rash or itching. °· During treatment, it is important that you follow these instructions: °· Avoid sexual activity or use condoms correctly. °· Do not douche. °· Avoid alcohol as directed by your health care provider. °· Avoid breastfeeding as directed by your health care provider. °SEEK MEDICAL CARE IF:  °· Your symptoms are not improving after 3 days of treatment. °· You have increased discharge or pain. °· You have a fever. °MAKE SURE YOU:  °· Understand these instructions. °· Will watch your condition. °· Will get help right away if you are not doing well or get worse. °FOR MORE INFORMATION  °Centers for Disease Control and Prevention, Division of STD Prevention: www.cdc.gov/std °American Sexual Health Association (ASHA): www.ashastd.org  °  °This information is not intended to replace advice given to you by your health care provider. Make sure you discuss any questions you have with your health care provider. °  °Document Released: 09/19/2005 Document Revised: 10/10/2014 Document Reviewed: 05/01/2013 °Elsevier Interactive Patient Education ©2016 Elsevier Inc. ° °Urinary Tract Infection °Urinary tract infections (UTIs) can develop anywhere along your urinary tract. Your urinary tract is your body's drainage system for removing wastes and extra water. Your urinary tract includes two kidneys, two ureters,   a bladder, and a urethra. Your kidneys are a pair of bean-shaped organs. Each kidney is about the size of your fist. They are located below  your ribs, one on each side of your spine. °CAUSES °Infections are caused by microbes, which are microscopic organisms, including fungi, viruses, and bacteria. These organisms are so small that they can only be seen through a microscope. Bacteria are the microbes that most commonly cause UTIs. °SYMPTOMS  °Symptoms of UTIs may vary by age and gender of the patient and by the location of the infection. Symptoms in young women typically include a frequent and intense urge to urinate and a painful, burning feeling in the bladder or urethra during urination. Older women and men are more likely to be tired, shaky, and weak and have muscle aches and abdominal pain. A fever may mean the infection is in your kidneys. Other symptoms of a kidney infection include pain in your back or sides below the ribs, nausea, and vomiting. °DIAGNOSIS °To diagnose a UTI, your caregiver will ask you about your symptoms. Your caregiver will also ask you to provide a urine sample. The urine sample will be tested for bacteria and white blood cells. White blood cells are made by your body to help fight infection. °TREATMENT  °Typically, UTIs can be treated with medication. Because most UTIs are caused by a bacterial infection, they usually can be treated with the use of antibiotics. The choice of antibiotic and length of treatment depend on your symptoms and the type of bacteria causing your infection. °HOME CARE INSTRUCTIONS °· If you were prescribed antibiotics, take them exactly as your caregiver instructs you. Finish the medication even if you feel better after you have only taken some of the medication. °· Drink enough water and fluids to keep your urine clear or pale yellow. °· Avoid caffeine, tea, and carbonated beverages. They tend to irritate your bladder. °· Empty your bladder often. Avoid holding urine for long periods of time. °· Empty your bladder before and after sexual intercourse. °· After a bowel movement, women should cleanse  from front to back. Use each tissue only once. °SEEK MEDICAL CARE IF:  °· You have back pain. °· You develop a fever. °· Your symptoms do not begin to resolve within 3 days. °SEEK IMMEDIATE MEDICAL CARE IF:  °· You have severe back pain or lower abdominal pain. °· You develop chills. °· You have nausea or vomiting. °· You have continued burning or discomfort with urination. °MAKE SURE YOU:  °· Understand these instructions. °· Will watch your condition. °· Will get help right away if you are not doing well or get worse. °  °This information is not intended to replace advice given to you by your health care provider. Make sure you discuss any questions you have with your health care provider. °  °Document Released: 06/29/2005 Document Revised: 06/10/2015 Document Reviewed: 10/28/2011 °Elsevier Interactive Patient Education ©2016 Elsevier Inc. ° °

## 2016-01-25 LAB — GC/CHLAMYDIA PROBE AMP (~~LOC~~) NOT AT ARMC
Chlamydia: NEGATIVE
Neisseria Gonorrhea: NEGATIVE

## 2016-03-30 ENCOUNTER — Inpatient Hospital Stay (HOSPITAL_COMMUNITY)
Admission: AD | Admit: 2016-03-30 | Discharge: 2016-03-30 | Disposition: A | Discharge status: Home / Self Care | Payer: Medicaid Other | Source: Ambulatory Visit | Attending: Obstetrics | Admitting: Obstetrics

## 2016-03-30 ENCOUNTER — Encounter (HOSPITAL_COMMUNITY): Payer: Self-pay

## 2016-03-30 DIAGNOSIS — D649 Anemia, unspecified: Secondary | ICD-10-CM | POA: Insufficient documentation

## 2016-03-30 DIAGNOSIS — R102 Pelvic and perineal pain: Secondary | ICD-10-CM | POA: Diagnosis not present

## 2016-03-30 DIAGNOSIS — I10 Essential (primary) hypertension: Secondary | ICD-10-CM | POA: Diagnosis not present

## 2016-03-30 LAB — CBC
HCT: 34.2 % — ABNORMAL LOW (ref 36.0–46.0)
Hemoglobin: 11.5 g/dL — ABNORMAL LOW (ref 12.0–15.0)
MCH: 28.3 pg (ref 26.0–34.0)
MCHC: 33.6 g/dL (ref 30.0–36.0)
MCV: 84.2 fL (ref 78.0–100.0)
PLATELETS: 330 10*3/uL (ref 150–400)
RBC: 4.06 MIL/uL (ref 3.87–5.11)
RDW: 12.1 % (ref 11.5–15.5)
WBC: 6.5 10*3/uL (ref 4.0–10.5)

## 2016-03-30 LAB — WET PREP, GENITAL
CLUE CELLS WET PREP: NONE SEEN
SPERM: NONE SEEN
TRICH WET PREP: NONE SEEN
Yeast Wet Prep HPF POC: NONE SEEN

## 2016-03-30 LAB — URINALYSIS, ROUTINE W REFLEX MICROSCOPIC
Bilirubin Urine: NEGATIVE
GLUCOSE, UA: NEGATIVE mg/dL
Hgb urine dipstick: NEGATIVE
Ketones, ur: NEGATIVE mg/dL
LEUKOCYTES UA: NEGATIVE
Nitrite: NEGATIVE
PH: 8 (ref 5.0–8.0)
Protein, ur: NEGATIVE mg/dL
Specific Gravity, Urine: 1.015 (ref 1.005–1.030)

## 2016-03-30 LAB — POCT PREGNANCY, URINE: Preg Test, Ur: NEGATIVE

## 2016-03-30 MED ORDER — IBUPROFEN 800 MG PO TABS
800.0000 mg | ORAL_TABLET | Freq: Once | ORAL | Status: AC
  Administered 2016-03-30: 800 mg via ORAL
  Filled 2016-03-30: qty 1

## 2016-03-30 NOTE — Discharge Instructions (Signed)
°  [facility]    [facility]  [phone]     Phone: [phone]          [facility]                      [facility]  @[email or web address]     Phone: [phone]  Phone: [phone]         [facility]     Phone: [phone]  Phone: [phone]           [facility] [place]                hone: [phone]  Phone: [phone]         [facility]      Phone: [phone]  Phone: [phone]         [facility]                Phone: [phone]  [facility]   [phone]    [facility] [place]([place])          Phone: [phone] [facility]   Phone: [phone]

## 2016-03-30 NOTE — MAU Note (Signed)
Pt presents to MAU with complaints of shooting pains in her vagina. Denies any vaginal bleeding at this time

## 2016-03-30 NOTE — MAU Provider Note (Signed)
History     CSN: [id number]  Arrival date and time: 03/30/16 1631   First Provider Initiated Contact with Patient 03/30/16 2017      Chief Complaint  Patient presents with  . Pelvic Pain   HPI Comments: Had period 6/13-6/16, and then had another episode of bleeding from 6/19-6/21. She reports pain that started when with the first episode of bleeding.   Pelvic Pain The patient's primary symptoms include pelvic pain. This is a new problem. The current episode started 1 to 4 weeks ago. The problem occurs intermittently. The problem has been unchanged. Pain severity now: 7/10  The problem affects both sides. Associated symptoms include abdominal pain, dysuria and nausea (ring causes nausea occasionally. ). Pertinent negatives include no chills, constipation, diarrhea, fever, frequency, urgency or vomiting. The vaginal discharge was malodorous and yellow. There has been no bleeding. She is sexually active. It is possible that her partner has an STD. She uses a contraceptive ring for contraception. Her menstrual history has been regular (03/15/16 ).    Past Medical History  Diagnosis Date  . Hypertension   . Anemia   . Pregnancy induced hypertension   . Headache(784.0)   . Preterm labor   . Infection     UTI  . Abnormal Pap smear     rpt was ok  . History of blood transfusion   . Hyperemesis gravidarum     Past Surgical History  Procedure Laterality Date  . No past surgeries    . Induced abortion      Family History  Problem Relation Age of Onset  . Other Neg Hx   . Cancer Maternal Grandfather   . Cancer Paternal Grandmother     Social History  Substance Use Topics  . Smoking status: Never Smoker   . Smokeless tobacco: Never Used  . Alcohol Use: 10.5 oz/week    21 drink(s) per week     Comment: -prior to + preg    Allergies:  Allergies  Allergen Reactions  . Flagyl [Metronidazole] Swelling and Dermatitis    Patient states can take gel form.   . Latex Swelling  and Dermatitis    Prescriptions prior to admission  Medication Sig Dispense Refill Last Dose  . etonogestrel-ethinyl estradiol (NUVARING) 0.12-0.015 MG/24HR vaginal ring Place 1 each vaginally every 28 (twenty-eight) days. Insert vaginally and leave in place for 3 consecutive weeks, then remove for 1 week.   01/19/2016  . metroNIDAZOLE (METROGEL VAGINAL) 0.75 % vaginal gel Place 1 Applicatorful vaginally at bedtime. 70 g 0     Review of Systems  Constitutional: Negative for fever and chills.  Gastrointestinal: Positive for nausea (ring causes nausea occasionally. ) and abdominal pain. Negative for vomiting, diarrhea and constipation.  Genitourinary: Positive for dysuria and pelvic pain. Negative for urgency and frequency.   Physical Exam   Blood pressure 134/85, pulse 81, temperature 97.6 F (36.4 C), resp. rate 18, last menstrual period 03/27/2016, unknown if currently breastfeeding.  Physical Exam  Nursing note and vitals reviewed. Constitutional: She is oriented to person, place, and time. She appears well-developed and well-nourished. No distress.  HENT:  Head: Normocephalic.  Cardiovascular: Normal rate.   Respiratory: Effort normal.  GI: Soft. There is no tenderness. There is no rebound.  Genitourinary:  External: no lesion Vagina: small amount of white discharge Cervix: pink, smooth, no CMT Uterus: NSSC Adnexa: NT   Neurological: She is alert and oriented to person, place, and time.  Skin: Skin is warm and dry.  Psychiatric: She has a normal mood and affect.   Results for orders placed or performed during the hospital encounter of 03/30/16 (from the past 24 hour(s))  Urinalysis, Routine w reflex microscopic (not at [facility])     Status: Abnormal   Collection Time: 03/30/16  5:30 PM  Result Value Ref Range   Color, Urine YELLOW YELLOW   APPearance HAZY (A) CLEAR   Specific Gravity, Urine 1.015 1.005 - 1.030   pH 8.0 5.0 - 8.0   Glucose, UA NEGATIVE NEGATIVE mg/dL   Hgb  urine dipstick NEGATIVE NEGATIVE   Bilirubin Urine NEGATIVE NEGATIVE   Ketones, ur NEGATIVE NEGATIVE mg/dL   Protein, ur NEGATIVE NEGATIVE mg/dL   Nitrite NEGATIVE NEGATIVE   Leukocytes, UA NEGATIVE NEGATIVE  Pregnancy, urine POC     Status: None   Collection Time: 03/30/16  5:45 PM  Result Value Ref Range   Preg Test, Ur NEGATIVE NEGATIVE  Wet prep, genital     Status: Abnormal   Collection Time: 03/30/16  8:30 PM  Result Value Ref Range   Yeast Wet Prep HPF POC NONE SEEN NONE SEEN   Trich, Wet Prep NONE SEEN NONE SEEN   Clue Cells Wet Prep HPF POC NONE SEEN NONE SEEN   WBC, Wet Prep HPF POC MANY (A) NONE SEEN   Sperm NONE SEEN   CBC     Status: Abnormal   Collection Time: 03/30/16  8:40 PM  Result Value Ref Range   WBC 6.5 4.0 - 10.5 K/uL   RBC 4.06 3.87 - 5.11 MIL/uL   Hemoglobin 11.5 (L) 12.0 - 15.0 g/dL   HCT 16.134.2 (L) 09.636.0 - 04.546.0 %   MCV 84.2 78.0 - 100.0 fL   MCH 28.3 26.0 - 34.0 pg   MCHC 33.6 30.0 - 36.0 g/dL   RDW 40.912.1 81.111.5 - 91.415.5 %   Platelets 330 150 - 400 K/uL    MAU Course  Procedures  MDM   Assessment and Plan   1. Pelvic pain in female    DC home Comfort measures reviewed  RX: none  Return to MAU as needed   Follow-up Information    Schedule an appointment as soon as possible for a visit with [facility].   Specialty:  Obstetrics and Gynecology   Contact information:   [address] [place] [id number] [phone]        Tawnya CrookHogan, Markayla Reichart Donovan 03/30/2016, 8:21 PM

## 2016-03-31 LAB — HIV ANTIBODY (ROUTINE TESTING W REFLEX): HIV Screen 4th Generation wRfx: NONREACTIVE

## 2016-03-31 LAB — GC/CHLAMYDIA PROBE AMP (~~LOC~~) NOT AT ARMC
CHLAMYDIA, DNA PROBE: NEGATIVE
NEISSERIA GONORRHEA: NEGATIVE

## 2016-03-31 LAB — RPR: RPR: NONREACTIVE

## 2016-06-18 LAB — LAB REPORT - SCANNED

## 2016-10-15 ENCOUNTER — Encounter (HOSPITAL_COMMUNITY): Payer: Self-pay | Admitting: Student

## 2016-10-15 ENCOUNTER — Inpatient Hospital Stay (HOSPITAL_COMMUNITY)
Admission: AD | Admit: 2016-10-15 | Discharge: 2016-10-15 | Disposition: A | Discharge status: Home / Self Care | Payer: Medicaid Other | Source: Ambulatory Visit | Attending: Obstetrics & Gynecology | Admitting: Obstetrics & Gynecology

## 2016-10-15 DIAGNOSIS — B9689 Other specified bacterial agents as the cause of diseases classified elsewhere: Secondary | ICD-10-CM | POA: Insufficient documentation

## 2016-10-15 DIAGNOSIS — J069 Acute upper respiratory infection, unspecified: Secondary | ICD-10-CM | POA: Diagnosis present

## 2016-10-15 DIAGNOSIS — N760 Acute vaginitis: Secondary | ICD-10-CM | POA: Diagnosis not present

## 2016-10-15 LAB — URINALYSIS, ROUTINE W REFLEX MICROSCOPIC
GLUCOSE, UA: NEGATIVE mg/dL
HGB URINE DIPSTICK: NEGATIVE
KETONES UR: 5 mg/dL — AB
LEUKOCYTES UA: NEGATIVE
Nitrite: NEGATIVE
Protein, ur: NEGATIVE mg/dL
Specific Gravity, Urine: 1.031 — ABNORMAL HIGH (ref 1.005–1.030)
pH: 6 (ref 5.0–8.0)

## 2016-10-15 LAB — CBC
HCT: 34.5 % — ABNORMAL LOW (ref 36.0–46.0)
HEMOGLOBIN: 11.9 g/dL — AB (ref 12.0–15.0)
MCH: 28.3 pg (ref 26.0–34.0)
MCHC: 34.5 g/dL (ref 30.0–36.0)
MCV: 81.9 fL (ref 78.0–100.0)
Platelets: 284 10*3/uL (ref 150–400)
RBC: 4.21 MIL/uL (ref 3.87–5.11)
RDW: 12.6 % (ref 11.5–15.5)
WBC: 10.1 10*3/uL (ref 4.0–10.5)

## 2016-10-15 LAB — WET PREP, GENITAL
SPERM: NONE SEEN
Trich, Wet Prep: NONE SEEN
Yeast Wet Prep HPF POC: NONE SEEN

## 2016-10-15 LAB — HIV ANTIBODY (ROUTINE TESTING W REFLEX): HIV SCREEN 4TH GENERATION: NONREACTIVE

## 2016-10-15 LAB — POCT PREGNANCY, URINE: Preg Test, Ur: NEGATIVE

## 2016-10-15 LAB — RPR: RPR Ser Ql: NONREACTIVE

## 2016-10-15 MED ORDER — ACETAMINOPHEN 325 MG PO TABS
650.0000 mg | ORAL_TABLET | Freq: Once | ORAL | Status: AC
  Administered 2016-10-15: 650 mg via ORAL
  Filled 2016-10-15: qty 2

## 2016-10-15 MED ORDER — BENZONATATE 100 MG PO CAPS
200.0000 mg | ORAL_CAPSULE | Freq: Once | ORAL | Status: DC
  Filled 2016-10-15: qty 2

## 2016-10-15 NOTE — MAU Note (Signed)
Pt was at work and had a sudden onset of chills, body aches and intermittent right, lower, abd pain.  Denies any vag bleeding, N,Vor D; but reports her normal white, thin, non odorous  discharge.

## 2016-10-15 NOTE — MAU Note (Signed)
Notified pt of order to do flu swab, She said, " oh no youre not stickin anything up my nose!"

## 2016-10-15 NOTE — Discharge Instructions (Signed)
Abdominal Pain, Adult Abdominal pain can be caused by many things. Often, abdominal pain is not serious and it gets better with no treatment or by being treated at home. However, sometimes abdominal pain is serious. Your health care provider will do a medical history and a physical exam to try to determine the cause of your abdominal pain. Follow these instructions at home:  Take over-the-counter and prescription medicines only as told by your health care provider. Do not take a laxative unless told by your health care provider.  Drink enough fluid to keep your urine clear or pale yellow.  Watch your condition for any changes.  Keep all follow-up visits as told by your health care provider. This is important. Contact a health care provider if:  Your abdominal pain changes or gets worse.  You are not hungry or you lose weight without trying.  You are constipated or have diarrhea for more than 2-3 days.  You have pain when you urinate or have a bowel movement.  Your abdominal pain wakes you up at night.  Your pain gets worse with meals, after eating, or with certain foods.  You are throwing up and cannot keep anything down.  You have a fever. Get help right away if:  Your pain does not go away as soon as your health care provider told you to expect.  You cannot stop throwing up.  Your pain is only in areas of the abdomen, such as the right side or the left lower portion of the abdomen.  You have bloody or black stools, or stools that look like tar.  You have severe pain, cramping, or bloating in your abdomen.  You have signs of dehydration, such as:  Dark urine, very little urine, or no urine.  Cracked lips.  Dry mouth.  Sunken eyes.  Sleepiness.  Weakness. This information is not intended to replace advice given to you by your health care provider. Make sure you discuss any questions you have with your health care provider. Document Released: 06/29/2005 Document  Revised: 04/08/2016 Document Reviewed: 03/02/2016 Elsevier Interactive Patient Education  2017 [employer] Upper Respiratory Infection, Adult Most upper respiratory infections (URIs) are a viral infection of the air passages leading to the lungs. A URI affects the nose, throat, and upper air passages. The most common type of URI is nasopharyngitis and is typically referred to as "the common cold." URIs run their course and usually go away on their own. Most of the time, a URI does not require medical attention, but sometimes a bacterial infection in the upper airways can follow a viral infection. This is called a secondary infection. Sinus and middle ear infections are common types of secondary upper respiratory infections. Bacterial pneumonia can also complicate a URI. A URI can worsen asthma and chronic obstructive pulmonary disease (COPD). Sometimes, these complications can require emergency medical care and may be life threatening. What are the causes? Almost all URIs are caused by viruses. A virus is a type of germ and can spread from one person to another. What increases the risk? You may be at risk for a URI if:  You smoke.  You have chronic heart or lung disease.  You have a weakened defense (immune) system.  You are very young or very old.  You have nasal allergies or asthma.  You work in crowded or poorly ventilated areas.  You work in health care facilities or schools. What are the signs or symptoms? Symptoms typically develop 2-3 days after  you come in contact with a cold virus. Most viral URIs last 7-10 days. However, viral URIs from the influenza virus (flu virus) can last 14-18 days and are typically more severe. Symptoms may include:  Runny or stuffy (congested) nose.  Sneezing.  Cough.  Sore throat.  Headache.  Fatigue.  Fever.  Loss of appetite.  Pain in your forehead, behind your eyes, and over your cheekbones (sinus pain).  Muscle aches. How is  this diagnosed? Your health care provider may diagnose a URI by:  Physical exam.  Tests to check that your symptoms are not due to another condition such as:  Strep throat.  Sinusitis.  Pneumonia.  Asthma. How is this treated? A URI goes away on its own with time. It cannot be cured with medicines, but medicines may be prescribed or recommended to relieve symptoms. Medicines may help:  Reduce your fever.  Reduce your cough.  Relieve nasal congestion. Follow these instructions at home:  Take medicines only as directed by your health care provider.  Gargle warm saltwater or take cough drops to comfort your throat as directed by your health care provider.  Use a warm mist humidifier or inhale steam from a shower to increase air moisture. This may make it easier to breathe.  Drink enough fluid to keep your urine clear or pale yellow.  Eat soups and other clear broths and maintain good nutrition.  Rest as needed.  Return to work when your temperature has returned to normal or as your health care provider advises. You may need to stay home longer to avoid infecting others. You can also use a face mask and careful hand washing to prevent spread of the virus.  Increase the usage of your inhaler if you have asthma.  Do not use any tobacco products, including cigarettes, chewing tobacco, or electronic cigarettes. If you need help quitting, ask your health care provider. How is this prevented? The best way to protect yourself from getting a cold is to practice good hygiene.  Avoid oral or hand contact with people with cold symptoms.  Wash your hands often if contact occurs. There is no clear evidence that vitamin C, vitamin E, echinacea, or exercise reduces the chance of developing a cold. However, it is always recommended to get plenty of rest, exercise, and practice good nutrition. Contact a health care provider if:  You are getting worse rather than better.  Your symptoms  are not controlled by medicine.  You have chills.  You have worsening shortness of breath.  You have brown or red mucus.  You have yellow or brown nasal discharge.  You have pain in your face, especially when you bend forward.  You have a fever.  You have swollen neck glands.  You have pain while swallowing.  You have white areas in the back of your throat. Get help right away if:  You have severe or persistent:  Headache.  Ear pain.  Sinus pain.  Chest pain.  You have chronic lung disease and any of the following:  Wheezing.  Prolonged cough.  Coughing up blood.  A change in your usual mucus.  You have a stiff neck.  You have changes in your:  Vision.  Hearing.  Thinking.  Mood. This information is not intended to replace advice given to you by your health care provider. Make sure you discuss any questions you have with your health care provider. Document Released: 03/15/2001 Document Revised: 05/22/2016 Document Reviewed: 12/25/2013 Elsevier Interactive Patient Education  2017 [place].

## 2016-10-15 NOTE — MAU Provider Note (Signed)
History     CSN: [id number]  Arrival date and time: 10/15/16 0403   First Provider Initiated Contact with Patient 10/15/16 0451        Chief Complaint  Patient presents with  . Cough  . Chills  . Abdominal Pain  . Generalized Body Aches   HPI  Kathy Dean is a 36 y.o. [id number] female who presents with abdominal pain, chills, body aches, & cough. Symptoms began while she was at work yesterday evening around 7 pm.  Reports RLQ pain that was initially sharp strong. Since then pain has been constant but milder than it initially was & now describes as sore & tender. Rates pain 6/10. Has not treated pain. Nothing makes better or worse.  Chills, body aches, & cough began soon after the abdominal pain. Did not check temperature at home. Cough is nonproductive. Took dayquil last night before bed without relief.  Denies ear ache, sore throat, anorexia, n/v/d, constipation, vaginal bleeding, dysuria, dyspareunia, or postcoital bleeding. Some vaginal discharge for the past month. Describes as thin & white; no odor or irritation.  Patient is sexually active with 1 partner & does not use contraception (stopped using nuvaring in December). Desires STI testing while she's here.   Past Medical History:  Diagnosis Date  . Abnormal Pap smear    rpt was ok  . Anemia   . Headache(784.0)   . History of blood transfusion   . Hyperemesis gravidarum   . Pregnancy induced hypertension   . Preterm labor     Past Surgical History:  Procedure Laterality Date  . INDUCED ABORTION      Family History  Problem Relation Age of Onset  . Cancer Maternal Grandfather   . Cancer Paternal Grandmother   . Other Neg Hx     Social History  Substance Use Topics  . Smoking status: Never Smoker  . Smokeless tobacco: Never Used  . Alcohol use 10.5 oz/week    21 Standard drinks or equivalent per week     Comment: one month ago    Allergies:  Allergies  Allergen Reactions  . Flagyl [Metronidazole]  Swelling and Dermatitis    Patient states can take gel form.   . Latex Swelling and Dermatitis    Prescriptions Prior to Admission  Medication Sig Dispense Refill Last Dose  . etonogestrel-ethinyl estradiol (NUVARING) 0.12-0.015 MG/24HR vaginal ring Place 1 each vaginally every 28 (twenty-eight) days. Insert vaginally and leave in place for 3 consecutive weeks, then remove for 1 week.   Past Month at Unknown time  . Pseudoephedrine-APAP-DM (DAYQUIL MULTI-SYMPTOM COLD/FLU PO) Take by mouth.   10/14/2016 at Unknown time    Review of Systems  Constitutional: Positive for chills. Negative for fever.  HENT: Negative for ear pain, sinus pain and sore throat.   Respiratory: Positive for cough. Negative for shortness of breath and wheezing.   Gastrointestinal: Positive for abdominal pain. Negative for abdominal distention, blood in stool, constipation, diarrhea, nausea and vomiting.  Genitourinary: Positive for vaginal discharge. Negative for dyspareunia, dysuria, pelvic pain, urgency and vaginal bleeding.  Musculoskeletal: Positive for myalgias.  Neurological: Negative for headaches.   Physical Exam   Last menstrual period 09/20/2015, unknown if currently breastfeeding.  Physical Exam  Nursing note and vitals reviewed. Constitutional: She is oriented to person, place, and time. She appears well-developed and well-nourished. No distress.  HENT:  Head: Normocephalic and atraumatic.  Eyes: Conjunctivae are normal. Right eye exhibits no discharge. Left eye exhibits no discharge. No scleral  icterus.  Neck: Normal range of motion.  Cardiovascular: Normal rate, regular rhythm and normal heart sounds.   No murmur heard. Respiratory: Effort normal and breath sounds normal. No respiratory distress. She has no wheezes.  GI: Soft. Bowel sounds are normal. She exhibits no distension and no mass. There is tenderness in the right lower quadrant. There is no guarding.  Genitourinary: Uterus normal. Cervix  exhibits no motion tenderness and no friability. Right adnexum displays no mass, no tenderness and no fullness. Left adnexum displays no mass, no tenderness and no fullness. Vaginal discharge (small amount of thin white discharge) found.  Neurological: She is alert and oriented to person, place, and time.  Skin: Skin is warm and dry. She is not diaphoretic.  Psychiatric: She has a normal mood and affect. Her behavior is normal. Judgment and thought content normal.    MAU Course  Procedures Results for orders placed or performed during the hospital encounter of 10/15/16 (from the past 24 hour(s))  Urinalysis, Routine w reflex microscopic (not at [facility])     Status: Abnormal   Collection Time: 10/15/16  4:15 AM  Result Value Ref Range   Color, Urine YELLOW YELLOW   APPearance CLEAR CLEAR   Specific Gravity, Urine 1.031 (H) 1.005 - 1.030   pH 6.0 5.0 - 8.0   Glucose, UA NEGATIVE NEGATIVE mg/dL   Hgb urine dipstick NEGATIVE NEGATIVE   Bilirubin Urine SMALL (A) NEGATIVE   Ketones, ur 5 (A) NEGATIVE mg/dL   Protein, ur NEGATIVE NEGATIVE mg/dL   Nitrite NEGATIVE NEGATIVE   Leukocytes, UA NEGATIVE NEGATIVE  Pregnancy, urine POC     Status: None   Collection Time: 10/15/16  4:20 AM  Result Value Ref Range   Preg Test, Ur NEGATIVE NEGATIVE  CBC     Status: Abnormal   Collection Time: 10/15/16  5:04 AM  Result Value Ref Range   WBC 10.1 4.0 - 10.5 K/uL   RBC 4.21 3.87 - 5.11 MIL/uL   Hemoglobin 11.9 (L) 12.0 - 15.0 g/dL   HCT 16.134.5 (L) 09.636.0 - 04.546.0 %   MCV 81.9 78.0 - 100.0 fL   MCH 28.3 26.0 - 34.0 pg   MCHC 34.5 30.0 - 36.0 g/dL   RDW 40.912.6 81.111.5 - 91.415.5 %   Platelets 284 150 - 400 K/uL  Wet prep, genital     Status: Abnormal   Collection Time: 10/15/16  5:35 AM  Result Value Ref Range   Yeast Wet Prep HPF POC NONE SEEN NONE SEEN   Trich, Wet Prep NONE SEEN NONE SEEN   Clue Cells Wet Prep HPF POC PRESENT (A) NONE SEEN   WBC, Wet Prep HPF POC MODERATE (A) NONE SEEN   Sperm NONE SEEN      MDM UPT negative U/a shows no evidence of infection or dehydration CBC -- no leukocytosis STI testing per patient request -- HIV, RPR, GC/CT, wet prep VSS, NAD Tylenol 650 mg PO Tessalon 200 mg PO -- pt declined this med after initially being receptive to medicine for her cough Flu swab ordered -- patient refused  Assessment and Plan  A; 1. Acute upper respiratory infection   2. BV (bacterial vaginosis)    P: Discharge home Pt opts no tx for BV d/t flagyl allergy & unable to afford metrogel Discussed symptomatic treatment for URI s/s Although acute appendicitis was ruled out during this visit; discussed importance of going to urgent care/ED for worsening symptoms  Kathy Dean 10/15/2016, 4:51 AM

## 2016-10-17 LAB — GC/CHLAMYDIA PROBE AMP (~~LOC~~) NOT AT ARMC
Chlamydia: NEGATIVE
NEISSERIA GONORRHEA: POSITIVE — AB

## 2016-10-18 ENCOUNTER — Telehealth (HOSPITAL_COMMUNITY): Payer: Self-pay | Admitting: *Deleted

## 2016-10-18 NOTE — Telephone Encounter (Signed)
Left voice mail regarding positive Chlamydia results. Unable to contact therefore no treatment at present time.  Form fax'd to health dept.

## 2017-03-06 ENCOUNTER — Encounter (HOSPITAL_COMMUNITY): Payer: Self-pay | Admitting: *Deleted

## 2017-03-06 ENCOUNTER — Inpatient Hospital Stay (HOSPITAL_COMMUNITY)
Admission: AD | Admit: 2017-03-06 | Discharge: 2017-03-06 | Disposition: A | Discharge status: Home / Self Care | Payer: Medicaid Other | Source: Ambulatory Visit | Attending: Obstetrics and Gynecology | Admitting: Obstetrics and Gynecology

## 2017-03-06 DIAGNOSIS — R1030 Lower abdominal pain, unspecified: Secondary | ICD-10-CM | POA: Diagnosis not present

## 2017-03-06 DIAGNOSIS — Z91040 Latex allergy status: Secondary | ICD-10-CM | POA: Diagnosis not present

## 2017-03-06 DIAGNOSIS — N760 Acute vaginitis: Secondary | ICD-10-CM | POA: Insufficient documentation

## 2017-03-06 DIAGNOSIS — R1031 Right lower quadrant pain: Secondary | ICD-10-CM | POA: Diagnosis present

## 2017-03-06 DIAGNOSIS — Z883 Allergy status to other anti-infective agents status: Secondary | ICD-10-CM | POA: Insufficient documentation

## 2017-03-06 DIAGNOSIS — B9689 Other specified bacterial agents as the cause of diseases classified elsewhere: Secondary | ICD-10-CM | POA: Diagnosis not present

## 2017-03-06 DIAGNOSIS — N898 Other specified noninflammatory disorders of vagina: Secondary | ICD-10-CM

## 2017-03-06 HISTORY — DX: Essential (primary) hypertension: I10

## 2017-03-06 LAB — WET PREP, GENITAL
Sperm: NONE SEEN
TRICH WET PREP: NONE SEEN
YEAST WET PREP: NONE SEEN

## 2017-03-06 LAB — URINALYSIS, ROUTINE W REFLEX MICROSCOPIC
BILIRUBIN URINE: NEGATIVE
Glucose, UA: NEGATIVE mg/dL
Hgb urine dipstick: NEGATIVE
Ketones, ur: NEGATIVE mg/dL
LEUKOCYTES UA: NEGATIVE
NITRITE: NEGATIVE
Protein, ur: NEGATIVE mg/dL
SPECIFIC GRAVITY, URINE: 1.026 (ref 1.005–1.030)
pH: 6 (ref 5.0–8.0)

## 2017-03-06 LAB — POCT PREGNANCY, URINE: PREG TEST UR: NEGATIVE

## 2017-03-06 MED ORDER — CLINDAMYCIN HCL 300 MG PO CAPS: ORAL_CAPSULE | ORAL | 0 refills | Status: DC

## 2017-03-06 MED ORDER — METRONIDAZOLE 0.75 % VA GEL: 1.0000 | Freq: Every day | VAGINAL | 0 refills | Status: AC

## 2017-03-06 NOTE — MAU Note (Signed)
Pt presents to MAU with complaints of lower abdominal cramping. Pt was treated for chlamydia in January and the pain went away and pt went back to health dept in march and states clean STD screen. Was treated for BV in march. LMP 03/03/17

## 2017-03-06 NOTE — MAU Provider Note (Signed)
History    Kathy Dean is a 36 yo [phone] [id number] who presents to the maternal admissions unit for intermittent right lower  abdominal pain for the past 4 months.  She was treated for a chlamydia infection in March and was rechecked in April for Chlamydia, Gonorrhea, HIV, and syphillus, all of which came back negative. Pertinent medical history include PID when she was 36 yo.  The pain has remained unchanged and she has not taken anything for it.  She reports that the pain comes about 4-5 times per day and lasts about 3-5 min. She is not on birth control and has regular periods.She admits to a yellowish "dingey" vaginal discharge She denies any past surgical history,  vaginal bleeding, dysuria, dyspareunia, Nausea, vomiting, chills, or fever.    CSN: [id number]  Arrival date and time: 03/06/17 1621   First Provider Initiated Contact with Patient 03/06/17 1754      Chief Complaint  Patient presents with  . Abdominal Pain   HPI  OB History    Gravida Para Term Preterm AB Living   7 4 1 3 3 4    SAB TAB Ectopic Multiple Live Births     3     1      Past Medical History:  Diagnosis Date  . Abnormal Pap smear    rpt was ok  . Anemia   . Chronic hypertension   . Headache(784.0)   . History of blood transfusion   . Hyperemesis gravidarum   . Pregnancy induced hypertension   . Preterm labor     Past Surgical History:  Procedure Laterality Date  . INDUCED ABORTION      Family History  Problem Relation Age of Onset  . Cancer Maternal Grandfather   . Cancer Paternal Grandmother   . Other Neg Hx     Social History  Substance Use Topics  . Smoking status: Never Smoker  . Smokeless tobacco: Never Used  . Alcohol use 10.5 oz/week    21 Standard drinks or equivalent per week     Comment: socially    Allergies:  Allergies  Allergen Reactions  . Flagyl [Metronidazole] Swelling and Dermatitis    Patient states can take gel form.   . Latex Swelling and Dermatitis     Prescriptions Prior to Admission  Medication Sig Dispense Refill Last Dose  . NON FORMULARY Take 5 drops by mouth 2 (two) times a week. CBD oil   Past Month at Unknown time  . OVER THE COUNTER MEDICATION Take 5 mLs by mouth 2 (two) times daily. Black seed oil   03/06/2017 at Unknown time    Review of Systems  Constitutional: Negative for chills and fever.  Respiratory: Negative for shortness of breath.   Gastrointestinal: Positive for abdominal pain. Negative for constipation, diarrhea and nausea.  Genitourinary: Positive for vaginal discharge. Negative for difficulty urinating, dyspareunia, dysuria, vaginal bleeding and vaginal pain.   Physical Exam   Results for orders placed or performed during the hospital encounter of 03/06/17 (from the past 24 hour(s))  Urinalysis, Routine w reflex microscopic     Status: None   Collection Time: 03/06/17  5:00 PM  Result Value Ref Range   Color, Urine YELLOW YELLOW   APPearance CLEAR CLEAR   Specific Gravity, Urine 1.026 1.005 - 1.030   pH 6.0 5.0 - 8.0   Glucose, UA NEGATIVE NEGATIVE mg/dL   Hgb urine dipstick NEGATIVE NEGATIVE   Bilirubin Urine NEGATIVE NEGATIVE   Ketones, ur NEGATIVE NEGATIVE  mg/dL   Protein, ur NEGATIVE NEGATIVE mg/dL   Nitrite NEGATIVE NEGATIVE   Leukocytes, UA NEGATIVE NEGATIVE  Pregnancy, urine POC     Status: None   Collection Time: 03/06/17  5:07 PM  Result Value Ref Range   Preg Test, Ur NEGATIVE NEGATIVE  Wet prep, genital     Status: Abnormal   Collection Time: 03/06/17  7:12 PM  Result Value Ref Range   Yeast Wet Prep HPF POC NONE SEEN NONE SEEN   Trich, Wet Prep NONE SEEN NONE SEEN   Clue Cells Wet Prep HPF POC PRESENT (A) NONE SEEN   WBC, Wet Prep HPF POC MODERATE (A) NONE SEEN   Sperm NONE SEEN   2 Blood pressure (!) 134/97, pulse 75, temperature 98.2 F (36.8 C), resp. rate 18, height 5\' 3"  (1.6 m), weight 50.3 kg (111 lb), last menstrual period 03/03/2017, unknown if currently  breastfeeding.  Physical Exam  Nursing note and vitals reviewed. Constitutional: She is oriented to person, place, and time. She appears well-developed and well-nourished.  Cardiovascular: Normal rate, regular rhythm and normal heart sounds.   Respiratory: Effort normal and breath sounds normal.  GI: Soft. Bowel sounds are normal. There is tenderness in the right lower quadrant and suprapubic area.  Genitourinary: Vagina normal and uterus normal. Cervix exhibits motion tenderness and discharge. Right adnexum displays tenderness.    Neurological: She is alert and oriented to person, place, and time.  Skin: Skin is warm and dry.  Psychiatric: She has a normal mood and affect. Her behavior is normal. Thought content normal.  . Today's Vitals   03/06/17 1654 03/06/17 1709 03/06/17 1943  BP: (!) 134/97  (!) 138/93  Pulse: 75  (!) 59  Resp: 18    Temp: 98.2 F (36.8 C)    Weight: 111 lb (50.3 kg)    Height: 5\' 3"  (1.6 m)    PainSc: 10-Worst pain ever 0-No pain 10-Worst pain ever   MAU Course  Procedures   GC/CHL and HIV pending  MDM MSE Exam Labs: Wet mount GC/Chlamydia  HIV UA Pregnancy testing   Assessment and Plan  Vaginal Discharge Bacterial vaginosis Abdominal Pain  PLAN: Rx for Metrogel to pharmacy.  Patient states she is unable to take pills so declined Clindamycin. She reports she does not have side effects or allergic reaction to  Metrogel.  Rx sent to pharmacy.   Advised pt to follow up with Gyn for further workup of long standing pain instructed pt to take OTC analgesics and hot compress to help with pain relief Discussed that she needs to see PCP to reevaluate BP--she is using Black Seeds OTC to treat and explained that this is not keeping her BP in good control.   Jeanann Lewandowsky 03/06/2017, 7:39 PM

## 2017-03-06 NOTE — Discharge Instructions (Signed)
Abdominal Pain, Adult Abdominal pain can be caused by many things. Often, abdominal pain is not serious and it gets better with no treatment or by being treated at home. However, sometimes abdominal pain is serious. Your health care provider will do a medical history and a physical exam to try to determine the cause of your abdominal pain. Follow these instructions at home:  Take over-the-counter and prescription medicines only as told by your health care provider. Do not take a laxative unless told by your health care provider.  Drink enough fluid to keep your urine clear or pale yellow.  Watch your condition for any changes.  Keep all follow-up visits as told by your health care provider. This is important. Contact a health care provider if:  Your abdominal pain changes or gets worse.  You are not hungry or you lose weight without trying.  You are constipated or have diarrhea for more than 2-3 days.  You have pain when you urinate or have a bowel movement.  Your abdominal pain wakes you up at night.  Your pain gets worse with meals, after eating, or with certain foods.  You are throwing up and cannot keep anything down.  You have a fever. Get help right away if:  Your pain does not go away as soon as your health care provider told you to expect.  You cannot stop throwing up.  Your pain is only in areas of the abdomen, such as the right side or the left lower portion of the abdomen.  You have bloody or black stools, or stools that look like tar.  You have severe pain, cramping, or bloating in your abdomen.  You have signs of dehydration, such as: ? Dark urine, very little urine, or no urine. ? Cracked lips. ? Dry mouth. ? Sunken eyes. ? Sleepiness. ? Weakness. This information is not intended to replace advice given to you by your health care provider. Make sure you discuss any questions you have with your health care provider. Document Released: 06/29/2005 Document  Revised: 04/08/2016 Document Reviewed: 03/02/2016 Elsevier Interactive Patient Education  2017 [employer]  Bacterial Vaginosis Bacterial vaginosis is an infection of the vagina. It happens when too many germs (bacteria) grow in the vagina. This infection puts you at risk for infections from sex (STIs). Treating this infection can lower your risk for some STIs. You should also treat this if you are pregnant. It can cause your baby to be born early. Follow these instructions at home: Medicines  Take over-the-counter and prescription medicines only as told by your doctor.  Take or use your antibiotic medicine as told by your doctor. Do not stop taking or using it even if you start to feel better. General instructions  If you your sexual partner is a woman, tell her that you have this infection. She needs to get treatment if she has symptoms. If you have a female partner, he does not need to be treated.  During treatment: ? Avoid sex. ? Do not douche. ? Avoid alcohol as told. ? Avoid breastfeeding as told.  Drink enough fluid to keep your pee (urine) clear or pale yellow.  Keep your vagina and butt (rectum) clean. ? Wash the area with warm water every day. ? Wipe from front to back after you use the toilet.  Keep all follow-up visits as told by your doctor. This is important. Preventing this condition  Do not douche.  Use only warm water to wash around your vagina.  Use protection when you have sex. This includes: ? Latex condoms. ? Dental dams.  Limit how many people you have sex with. It is best to only have sex with the same person (be monogamous).  Get tested for STIs. Have your partner get tested.  Wear underwear that is cotton or lined with cotton.  Avoid tight pants and pantyhose. This is most important in summer.  Do not use any products that have nicotine or tobacco in them. These include cigarettes and e-cigarettes. If you need help quitting, ask your  doctor.  Do not use illegal drugs.  Limit how much alcohol you drink. Contact a doctor if:  Your symptoms do not get better, even after you are treated.  You have more discharge or pain when you pee (urinate).  You have a fever.  You have pain in your belly (abdomen).  You have pain with sex.  Your bleed from your vagina between periods. Summary  This infection happens when too many germs (bacteria) grow in the vagina.  Treating this condition can lower your risk for some infections from sex (STIs).  You should also treat this if you are pregnant. It can cause early (premature) birth.  Do not stop taking or using your antibiotic medicine even if you start to feel better. This information is not intended to replace advice given to you by your health care provider. Make sure you discuss any questions you have with your health care provider. Document Released: 06/28/2008 Document Revised: 06/04/2016 Document Reviewed: 06/04/2016 Elsevier Interactive Patient Education  2017 [employer].

## 2017-03-07 LAB — HIV ANTIBODY (ROUTINE TESTING W REFLEX): HIV Screen 4th Generation wRfx: NONREACTIVE

## 2017-03-07 LAB — GC/CHLAMYDIA PROBE AMP (~~LOC~~) NOT AT ARMC
CHLAMYDIA, DNA PROBE: NEGATIVE
Neisseria Gonorrhea: NEGATIVE

## 2017-06-18 ENCOUNTER — Encounter (HOSPITAL_COMMUNITY): Payer: Self-pay

## 2017-06-18 ENCOUNTER — Inpatient Hospital Stay (HOSPITAL_COMMUNITY)
Admission: AD | Admit: 2017-06-18 | Discharge: 2017-06-18 | Disposition: A | Discharge status: Home / Self Care | Payer: Medicaid Other | Source: Ambulatory Visit | Attending: Obstetrics & Gynecology | Admitting: Obstetrics & Gynecology

## 2017-06-18 DIAGNOSIS — R2231 Localized swelling, mass and lump, right upper limb: Secondary | ICD-10-CM | POA: Insufficient documentation

## 2017-06-18 DIAGNOSIS — I10 Essential (primary) hypertension: Secondary | ICD-10-CM | POA: Diagnosis not present

## 2017-06-18 DIAGNOSIS — N760 Acute vaginitis: Secondary | ICD-10-CM | POA: Insufficient documentation

## 2017-06-18 DIAGNOSIS — R109 Unspecified abdominal pain: Secondary | ICD-10-CM | POA: Diagnosis present

## 2017-06-18 DIAGNOSIS — B9689 Other specified bacterial agents as the cause of diseases classified elsewhere: Secondary | ICD-10-CM | POA: Insufficient documentation

## 2017-06-18 DIAGNOSIS — D649 Anemia, unspecified: Secondary | ICD-10-CM | POA: Diagnosis not present

## 2017-06-18 LAB — CBC
HCT: 29.6 % — ABNORMAL LOW (ref 36.0–46.0)
HEMOGLOBIN: 9.9 g/dL — AB (ref 12.0–15.0)
MCH: 26.3 pg (ref 26.0–34.0)
MCHC: 33.4 g/dL (ref 30.0–36.0)
MCV: 78.7 fL (ref 78.0–100.0)
PLATELETS: 279 10*3/uL (ref 150–400)
RBC: 3.76 MIL/uL — AB (ref 3.87–5.11)
RDW: 14.1 % (ref 11.5–15.5)
WBC: 8.7 10*3/uL (ref 4.0–10.5)

## 2017-06-18 LAB — WET PREP, GENITAL
Sperm: NONE SEEN
Trich, Wet Prep: NONE SEEN
YEAST WET PREP: NONE SEEN

## 2017-06-18 LAB — URINALYSIS, ROUTINE W REFLEX MICROSCOPIC
BILIRUBIN URINE: NEGATIVE
Glucose, UA: NEGATIVE mg/dL
Hgb urine dipstick: NEGATIVE
KETONES UR: NEGATIVE mg/dL
LEUKOCYTES UA: NEGATIVE
NITRITE: NEGATIVE
PROTEIN: NEGATIVE mg/dL
Specific Gravity, Urine: 1.014 (ref 1.005–1.030)
pH: 7 (ref 5.0–8.0)

## 2017-06-18 LAB — POCT PREGNANCY, URINE: PREG TEST UR: NEGATIVE

## 2017-06-18 MED ORDER — FLUCONAZOLE 150 MG PO TABS: 150.0000 mg | ORAL_TABLET | Freq: Once | ORAL | 0 refills | Status: AC

## 2017-06-18 MED ORDER — METRONIDAZOLE 0.75 % VA GEL: 1.0000 | Freq: Every day | VAGINAL | 1 refills | Status: DC

## 2017-06-18 MED ORDER — BOOST 100 CALORIE SMART PO LIQD: 8.0000 [oz_av] | Freq: Two times a day (BID) | ORAL | 0 refills | Status: DC

## 2017-06-18 MED ORDER — CLINDAMYCIN PHOSPHATE 2 % VA CREA: 1.0000 | TOPICAL_CREAM | Freq: Every day | VAGINAL | 0 refills | Status: DC

## 2017-06-18 NOTE — Discharge Instructions (Signed)
Use only one of the creams prescribed after checking pricing  Bacterial Vaginosis Bacterial vaginosis is an infection of the vagina. It happens when too many germs (bacteria) grow in the vagina. This infection puts you at risk for infections from sex (STIs). Treating this infection can lower your risk for some STIs. You should also treat this if you are pregnant. It can cause your baby to be born early. Follow these instructions at home: Medicines  Take over-the-counter and prescription medicines only as told by your doctor.  Take or use your antibiotic medicine as told by your doctor. Do not stop taking or using it even if you start to feel better. General instructions  If you your sexual partner is a woman, tell her that you have this infection. She needs to get treatment if she has symptoms. If you have a female partner, he does not need to be treated.  During treatment: ? Avoid sex. ? Do not douche. ? Avoid alcohol as told. ? Avoid breastfeeding as told.  Drink enough fluid to keep your pee (urine) clear or pale yellow.  Keep your vagina and butt (rectum) clean. ? Wash the area with warm water every day. ? Wipe from front to back after you use the toilet.  Keep all follow-up visits as told by your doctor. This is important. Preventing this condition  Do not douche.  Use only warm water to wash around your vagina.  Use protection when you have sex. This includes: ? Latex condoms. ? Dental dams.  Limit how many people you have sex with. It is best to only have sex with the same person (be monogamous).  Get tested for STIs. Have your partner get tested.  Wear underwear that is cotton or lined with cotton.  Avoid tight pants and pantyhose. This is most important in summer.  Do not use any products that have nicotine or tobacco in them. These include cigarettes and e-cigarettes. If you need help quitting, ask your doctor.  Do not use illegal drugs.  Limit how much  alcohol you drink. Contact a doctor if:  Your symptoms do not get better, even after you are treated.  You have more discharge or pain when you pee (urinate).  You have a fever.  You have pain in your belly (abdomen).  You have pain with sex.  Your bleed from your vagina between periods. Summary  This infection happens when too many germs (bacteria) grow in the vagina.  Treating this condition can lower your risk for some infections from sex (STIs).  You should also treat this if you are pregnant. It can cause early (premature) birth.  Do not stop taking or using your antibiotic medicine even if you start to feel better. This information is not intended to replace advice given to you by your health care provider. Make sure you discuss any questions you have with your health care provider. Document Released: 06/28/2008 Document Revised: 06/04/2016 Document Reviewed: 06/04/2016 Elsevier Interactive Patient Education  2017 [employer].

## 2017-06-18 NOTE — MAU Provider Note (Signed)
History   [id number]   Chief Complaint  Patient presents with  . Abdominal Pain    HPI Kathy Dean is a 36 y.o. female  [phone] here with report of abdominal pain that started yesterday.  Pain is described as intermittent and sharp and rated 8/10.  +vaginal discharge, white, +odor.  +sexually active, new partner.  Desires STI screening.  Declines family planing at this time.  While completing pelvic exam, patient reported a mass under right arm that's been present x 1 year.    Patient's last menstrual period was 04/30/2017.  OB History  Gravida Para Term Preterm AB Living  SAB TAB Ectopic Multiple Live Births    3     1    # Outcome Date GA Lbr Len/2nd Weight Sex Delivery Anes PTL Lv  7 Term 04/24/14 [redacted]w[redacted]d 17:30 / 00:40 7 lb 4.8 oz (3.31 kg) M Vag-Spont EPI  LIV  6 TAB           5 TAB           4 TAB           3 Preterm      Vag-Spont     2 Preterm      Vag-Spont     1 Preterm      Vag-Spont         Past Medical History:  Diagnosis Date  . Abnormal Pap smear    rpt was ok  . Anemia   . Chronic hypertension   . Headache(784.0)   . History of blood transfusion   . Hyperemesis gravidarum   . Pregnancy induced hypertension   . Preterm labor     Family History  Problem Relation Age of Onset  . Cancer Maternal Grandfather   . Cancer Paternal Grandmother   . Other Neg Hx     Social History   Social History  . Marital status: Single    Spouse name: N/A  . Number of children: N/A  . Years of education: N/A   Social History Main Topics  . Smoking status: Never Smoker  . Smokeless tobacco: Never Used  . Alcohol use 10.5 oz/week    21 Standard drinks or equivalent per week     Comment: socially  . Drug use: Yes    Types: Marijuana     Comment: occasional  . Sexual activity: Yes    Birth control/ protection: None     Comment: last sex 13 Oct 2016   Other Topics Concern  . None   Social History Narrative  . None    Allergies  Allergen  Reactions  . Flagyl [Metronidazole] Swelling and Dermatitis    Patient states can take gel form.   . Latex Swelling and Dermatitis    No current facility-administered medications on file prior to encounter.    Current Outpatient Prescriptions on File Prior to Encounter  Medication Sig Dispense Refill  . OVER THE COUNTER MEDICATION Take 5 mLs by mouth 2 (two) times daily. Black seed oil    . NON FORMULARY Take 5 drops by mouth 2 (two) times a week. CBD oil       Review of Systems  Constitutional: Negative for chills and fever.  Gastrointestinal: Positive for diarrhea. Negative for vomiting.  Genitourinary: Positive for frequency, pelvic pain and vaginal discharge. Negative for dysuria, hematuria, urgency, vaginal bleeding and vaginal pain.  Skin:       Right sided axillary  mass  Neurological: Negative for headaches.  All other systems reviewed and are negative.    Physical Exam   Vitals:   06/18/17 1934  BP: 121/72  Pulse: 68  Resp: 18  Temp: 98.1 F (36.7 C)  TempSrc: Oral  SpO2: 100%  Weight: 108 lb (49 kg)  Height:  (1.575 m)    Physical Exam  Constitutional: She is oriented to person, place, and time. She appears well-developed and well-nourished.  HENT:  Head: Normocephalic.  Eyes: Pupils are equal, round, and reactive to light.  Neck: Normal range of motion. Neck supple.  Cardiovascular: Normal rate, regular rhythm and normal heart sounds.   Respiratory: Effort normal and breath sounds normal. Right breast exhibits mass. Right breast exhibits no inverted nipple, no nipple discharge, no skin change and no tenderness. Left breast exhibits no inverted nipple, no mass, no nipple discharge, no skin change and no tenderness.    GI: Soft. She exhibits no mass. There is no tenderness. There is no guarding.  Genitourinary: Uterus is not enlarged and not tender. Cervix exhibits no motion tenderness and no discharge. Right adnexum displays tenderness. Right adnexum  displays no mass. Left adnexum displays no mass and no tenderness. Vaginal discharge (white, creamy ) found.  Genitourinary Comments: Negative cervical motion tenderness  Neurological: She is alert and oriented to person, place, and time. She has normal reflexes.  Skin: Skin is warm and dry.    MAU Course  Procedures  MDM Results for orders placed or performed during the hospital encounter of 06/18/17 (from the past 24 hour(s))  Urinalysis, Routine w reflex microscopic     Status: None   Collection Time: 06/18/17  7:40 PM  Result Value Ref Range   Color, Urine YELLOW YELLOW   APPearance CLEAR CLEAR   Specific Gravity, Urine 1.014 1.005 - 1.030   pH 7.0 5.0 - 8.0   Glucose, UA NEGATIVE NEGATIVE mg/dL   Hgb urine dipstick NEGATIVE NEGATIVE   Bilirubin Urine NEGATIVE NEGATIVE   Ketones, ur NEGATIVE NEGATIVE mg/dL   Protein, ur NEGATIVE NEGATIVE mg/dL   Nitrite NEGATIVE NEGATIVE   Leukocytes, UA NEGATIVE NEGATIVE  Pregnancy, urine POC     Status: None   Collection Time: 06/18/17  8:29 PM  Result Value Ref Range   Preg Test, Ur NEGATIVE NEGATIVE  Wet prep, genital     Status: Abnormal   Collection Time: 06/18/17  9:18 PM  Result Value Ref Range   Yeast Wet Prep HPF POC NONE SEEN NONE SEEN   Trich, Wet Prep NONE SEEN NONE SEEN   Clue Cells Wet Prep HPF POC PRESENT (A) NONE SEEN   WBC, Wet Prep HPF POC MODERATE (A) NONE SEEN   Sperm NONE SEEN   CBC     Status: Abnormal   Collection Time: 06/18/17  9:21 PM  Result Value Ref Range   WBC 8.7 4.0 - 10.5 K/uL   RBC 3.76 (L) 3.87 - 5.11 MIL/uL   Hemoglobin 9.9 (L) 12.0 - 15.0 g/dL   HCT 16.1 (L) 09.6 - 04.5 %   MCV 78.7 78.0 - 100.0 fL   MCH 26.3 26.0 - 34.0 pg   MCHC 33.4 30.0 - 36.0 g/dL   RDW 40.9 81.1 - 91.4 %   Platelets 279 150 - 400 K/uL     Assessment and Plan  Right Axillary Mass Bacterial Vaginosis Anemia  Plan: Refer to [facility] for mammogram RX Metrogel or Clindamycin Cream (pt will see which one is least  expensive) OTC Fioridex (plant based iron); Take with vitamin C Follow-up at [facility]  Marlis Edelson, [place] 06/18/2017 8:57 PM

## 2017-06-18 NOTE — MAU Note (Signed)
Pt here with c/o abdominal pain. Denies any bleeding. Period is late

## 2017-06-18 NOTE — MAU Provider Note (Signed)
Upon discharge, pt reported losing 16 lbs over past 2 months and requested RX for Boost supplements.  Also reports experiencing increased stress due to death of father of baby due to hypertension related complications at 36 yo.  Follow-up at [facility].

## 2017-06-19 LAB — GC/CHLAMYDIA PROBE AMP (~~LOC~~) NOT AT ARMC
Chlamydia: NEGATIVE
Neisseria Gonorrhea: NEGATIVE

## 2017-06-19 LAB — HIV ANTIBODY (ROUTINE TESTING W REFLEX): HIV SCREEN 4TH GENERATION: NONREACTIVE

## 2017-06-23 ENCOUNTER — Other Ambulatory Visit (HOSPITAL_COMMUNITY): Payer: Self-pay | Admitting: *Deleted

## 2017-06-23 DIAGNOSIS — N6310 Unspecified lump in the right breast, unspecified quadrant: Secondary | ICD-10-CM

## 2017-06-25 ENCOUNTER — Emergency Department (HOSPITAL_COMMUNITY): Payer: No Typology Code available for payment source

## 2017-06-25 ENCOUNTER — Emergency Department (HOSPITAL_COMMUNITY)
Admission: EM | Admit: 2017-06-25 | Discharge: 2017-06-25 | Disposition: A | Discharge status: Home / Self Care | Payer: No Typology Code available for payment source | Attending: Emergency Medicine | Admitting: Emergency Medicine

## 2017-06-25 ENCOUNTER — Encounter (HOSPITAL_COMMUNITY): Payer: Self-pay | Admitting: Emergency Medicine

## 2017-06-25 DIAGNOSIS — S0091XA Abrasion of unspecified part of head, initial encounter: Secondary | ICD-10-CM | POA: Insufficient documentation

## 2017-06-25 DIAGNOSIS — M542 Cervicalgia: Secondary | ICD-10-CM | POA: Diagnosis not present

## 2017-06-25 DIAGNOSIS — Y929 Unspecified place or not applicable: Secondary | ICD-10-CM | POA: Diagnosis not present

## 2017-06-25 DIAGNOSIS — Y999 Unspecified external cause status: Secondary | ICD-10-CM | POA: Insufficient documentation

## 2017-06-25 DIAGNOSIS — Y9389 Activity, other specified: Secondary | ICD-10-CM | POA: Diagnosis not present

## 2017-06-25 DIAGNOSIS — M545 Low back pain: Secondary | ICD-10-CM | POA: Diagnosis not present

## 2017-06-25 DIAGNOSIS — D649 Anemia, unspecified: Secondary | ICD-10-CM | POA: Insufficient documentation

## 2017-06-25 DIAGNOSIS — I10 Essential (primary) hypertension: Secondary | ICD-10-CM | POA: Diagnosis not present

## 2017-06-25 DIAGNOSIS — Z91040 Latex allergy status: Secondary | ICD-10-CM | POA: Diagnosis not present

## 2017-06-25 DIAGNOSIS — S0990XA Unspecified injury of head, initial encounter: Secondary | ICD-10-CM | POA: Diagnosis present

## 2017-06-25 LAB — I-STAT CHEM 8, ED
BUN: 7 mg/dL (ref 6–20)
CALCIUM ION: 1.14 mmol/L — AB (ref 1.15–1.40)
CHLORIDE: 107 mmol/L (ref 101–111)
Creatinine, Ser: 0.7 mg/dL (ref 0.44–1.00)
Glucose, Bld: 86 mg/dL (ref 65–99)
HEMATOCRIT: 31 % — AB (ref 36.0–46.0)
Hemoglobin: 10.5 g/dL — ABNORMAL LOW (ref 12.0–15.0)
Potassium: 3.3 mmol/L — ABNORMAL LOW (ref 3.5–5.1)
SODIUM: 141 mmol/L (ref 135–145)
TCO2: 22 mmol/L (ref 22–32)

## 2017-06-25 LAB — I-STAT BETA HCG BLOOD, ED (MC, WL, AP ONLY): I-stat hCG, quantitative: <5 m[IU]/mL (ref <5)

## 2017-06-25 MED ORDER — SODIUM CHLORIDE 0.9 % IV BOLUS (SEPSIS): 500.0000 mL | Freq: Once | INTRAVENOUS | Status: DC

## 2017-06-25 MED ORDER — KETOROLAC TROMETHAMINE 30 MG/ML IJ SOLN
30.0000 mg | Freq: Once | INTRAMUSCULAR | Status: DC
  Filled 2017-06-25: qty 1
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
## 2017-06-25 NOTE — ED Notes (Signed)
ED Provider at bedside. 
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
## 2017-06-27 ENCOUNTER — Ambulatory Visit
Admission: RE | Admit: 2017-06-27 | Discharge: 2017-06-27 | Disposition: A | Discharge status: Home / Self Care | Payer: No Typology Code available for payment source | Source: Ambulatory Visit | Attending: Obstetrics and Gynecology | Admitting: Obstetrics and Gynecology

## 2017-06-27 ENCOUNTER — Ambulatory Visit (HOSPITAL_COMMUNITY)
Admission: RE | Admit: 2017-06-27 | Discharge: 2017-06-27 | Disposition: A | Discharge status: Home / Self Care | Payer: Self-pay | Source: Ambulatory Visit | Attending: Obstetrics and Gynecology | Admitting: Obstetrics and Gynecology

## 2017-06-27 ENCOUNTER — Encounter (HOSPITAL_COMMUNITY): Payer: Self-pay

## 2017-06-27 VITALS — BP 110/70 | Temp 98.5°F | Ht 62.0 in

## 2017-06-27 DIAGNOSIS — N6311 Unspecified lump in the right breast, upper outer quadrant: Secondary | ICD-10-CM

## 2017-06-27 DIAGNOSIS — N6310 Unspecified lump in the right breast, unspecified quadrant: Secondary | ICD-10-CM

## 2017-06-27 DIAGNOSIS — Z1239 Encounter for other screening for malignant neoplasm of breast: Secondary | ICD-10-CM

## 2017-06-27 NOTE — Addendum Note (Signed)
Encounter addended by: Priscille Heidelberg, RN on: 06/27/2017 12:53 PM<BR>    Actions taken: Visit diagnoses modified

## 2017-06-27 NOTE — Patient Instructions (Signed)
Explained breast self awareness with Labria C Franks. Patient did not need a Pap smear today due to last Pap smear was in April 2017 per patient. Let her know [facility] will cover Pap smears every 3 years unless has a history of abnormal Pap smears. Referred patient to the [facility] for diagnostic mammogram and right breast ultrasound. Appointment scheduled for Tuesday, June 27, 2017 at 1120. Rayhana C Scicchitano verbalized understanding.  Emmily Pellegrin, Kathaleen Maser, RN 12:23 PM

## 2017-06-27 NOTE — Progress Notes (Signed)
Complaints of right breast lump x 8-9 months that has increased in size.  Pap Smear: Pap smear not completed today. Last Pap smear was in April 2017 at the [facility] and normal per patient. Per patient has a history of two abnormal Pap smears around 15 years ago that repeat Pap smears were completed for follow-up.  Physical exam: Breasts Breasts symmetrical. No skin abnormalities bilateral breasts. No nipple retraction bilateral breasts. No nipple discharge bilateral breasts. No lymphadenopathy. No lumps palpated left breast. Palpated a mobile lump within the right breast at 11 o'clock 10 cm from the nipple. No complaints of pain or tenderness on exam. Referred patient to the [facility] for diagnostic mammogram and right breast ultrasound. Appointment scheduled for Tuesday, June 27, 2017 at 1120.        Pelvic/Bimanual No Pap smear completed today since last Pap smear was in April 2017 per patient. Pap smear not indicated per [facility]CP guidelines.   Smoking History: Patient has never smoked.  Patient Navigation: Patient education provided. Access to services provided for patient through [facility] program.

## 2017-06-29 ENCOUNTER — Encounter (HOSPITAL_COMMUNITY): Payer: Self-pay | Admitting: *Deleted

## 2017-07-28 ENCOUNTER — Inpatient Hospital Stay (HOSPITAL_COMMUNITY)
Admission: AD | Admit: 2017-07-28 | Discharge: 2017-07-28 | Disposition: A | Discharge status: Home / Self Care | Payer: Medicaid Other | Source: Ambulatory Visit | Attending: Obstetrics & Gynecology | Admitting: Obstetrics & Gynecology

## 2017-07-28 DIAGNOSIS — Z0130 Encounter for examination of blood pressure without abnormal findings: Secondary | ICD-10-CM | POA: Insufficient documentation

## 2017-07-28 DIAGNOSIS — R51 Headache: Secondary | ICD-10-CM | POA: Diagnosis not present

## 2017-07-28 NOTE — MAU Note (Signed)
PT SAYS  SHE HAS HIGH BP   AND H/A STARTED  AT 6 30PM-   TYLENOL  AT 830 2 TABS REG  STR.    SHE WENT TO PHARMACY  ON  [address]   TO TAKE  BP-  IS  SUPPOSE  TO TAKE  BP MEDS - BUT DOES NOT

## 2017-07-28 NOTE — MAU Provider Note (Signed)
[name] is a 36 y.o. [id number] non-pregnant female here for BP check. She reports having an occipital and periorbital HA that started earlier tonight and was not responsive to Tylenol. This prompted her to think her BP was elevated. She reports hx of CHTN but is not taking meds that were prescribed. She was at work today and reports high stress environment.  ROS:  No visual disturbances No nausea  BP 136/81 (BP Location: Right Arm)   Pulse 68   Temp 98.1 F (36.7 C) (Oral)   Resp 20   Ht 5\' 2"  (1.575 m)   Wt 108 lb (49 kg)   LMP 07/23/2017   BMI 19.75 kg/m   CONSTITUTIONAL: Well-developed, well-nourished female in no acute distress.  HEENT: PERRL MUSCULOSKELETAL: Normal range of motion.  CARDIOVASCULAR: Regular heart rate RESPIRATORY: Normal effort NEUROLOGICAL: Alert and oriented to person, place, and time.  SKIN: No pallor. PSYCH: Normal mood and affect. Normal behavior. Normal judgment and thought content. NEURO: grossly normal  No results found for this or any previous visit (from the past 24 hour(s)).  MDM BP normal. Pt reassured. She declines evaluation for her HA.   A: Blood pressure check  P: Discharge home Encouraged pt to establish care with a PCP  Donette LarryBhambri, Kathy Dean, CNM  07/28/2017 3:00 AM

## 2017-07-28 NOTE — Discharge Instructions (Signed)
Hypertension Hypertension is another name for high blood pressure. High blood pressure forces your heart to work harder to pump blood. This can cause problems over time. There are two numbers in a blood pressure reading. There is a top number (systolic) over a bottom number (diastolic). It is best to have a blood pressure below 120/80. Healthy choices can help lower your blood pressure. You may need medicine to help lower your blood pressure if:  Your blood pressure cannot be lowered with healthy choices.  Your blood pressure is higher than 130/80.  Follow these instructions at home: Eating and drinking  If directed, follow the DASH eating plan. This diet includes: ? Filling half of your plate at each meal with fruits and vegetables. ? Filling one quarter of your plate at each meal with whole grains. Whole grains include whole wheat pasta, brown rice, and whole grain bread. ? Eating or drinking low-fat dairy products, such as skim milk or low-fat yogurt. ? Filling one quarter of your plate at each meal with low-fat (lean) proteins. Low-fat proteins include fish, skinless chicken, eggs, beans, and tofu. ? Avoiding fatty meat, cured and processed meat, or chicken with skin. ? Avoiding premade or processed food.  Eat less than 1,500 mg of salt (sodium) a day.  Limit alcohol use to no more than 1 drink a day for nonpregnant women and 2 drinks a day for men. One drink equals 12 oz of beer, 5 oz of wine, or 1 oz of hard liquor. Lifestyle  Work with your doctor to stay at a healthy weight or to lose weight. Ask your doctor what the best weight is for you.  Get at least 30 minutes of exercise that causes your heart to beat faster (aerobic exercise) most days of the week. This may include walking, swimming, or biking.  Get at least 30 minutes of exercise that strengthens your muscles (resistance exercise) at least 3 days a week. This may include lifting weights or pilates.  Do not use any  products that contain nicotine or tobacco. This includes cigarettes and e-cigarettes. If you need help quitting, ask your doctor.  Check your blood pressure at home as told by your doctor.  Keep all follow-up visits as told by your doctor. This is important. Medicines  Take over-the-counter and prescription medicines only as told by your doctor. Follow directions carefully.  Do not skip doses of blood pressure medicine. The medicine does not work as well if you skip doses. Skipping doses also puts you at risk for problems.  Ask your doctor about side effects or reactions to medicines that you should watch for. Contact a doctor if:  You think you are having a reaction to the medicine you are taking.  You have headaches that keep coming back (recurring).  You feel dizzy.  You have swelling in your ankles.  You have trouble with your vision. Get help right away if:  You get a very bad headache.  You start to feel confused.  You feel weak or numb.  You feel faint.  You get very bad pain in your: ? Chest. ? Belly (abdomen).  You throw up (vomit) more than once.  You have trouble breathing. Summary  Hypertension is another name for high blood pressure.  Making healthy choices can help lower blood pressure. If your blood pressure cannot be controlled with healthy choices, you may need to take medicine. This information is not intended to replace advice given to you by your health care   provider. Make sure you discuss any questions you have with your health care provider. Document Released: 03/07/2008 Document Revised: 08/17/2016 Document Reviewed: 08/17/2016 Elsevier Interactive Patient Education  2018 [employer] General Headache Without Cause A headache is pain or discomfort felt around the head or neck area. There are many causes and types of headaches. In some cases, the cause may not be found. Follow these instructions at home: Managing pain  Take over-the-counter  and prescription medicines only as told by your doctor.  Lie down in a dark, quiet room when you have a headache.  If directed, apply ice to the head and neck area: ? Put ice in a plastic bag. ? Place a towel between your skin and the bag. ? Leave the ice on for 20 minutes, 2-3 times per day.  Use a heating pad or hot shower to apply heat to the head and neck area as told by your doctor.  Keep lights dim if bright lights bother you or make your headaches worse. Eating and drinking  Eat meals on a regular schedule.  Lessen how much alcohol you drink.  Lessen how much caffeine you drink, or stop drinking caffeine. General instructions  Keep all follow-up visits as told by your doctor. This is important.  Keep a journal to find out if certain things bring on headaches. For example, write down: ? What you eat and drink. ? How much sleep you get. ? Any change to your diet or medicines.  Relax by getting a massage or doing other relaxing activities.  Lessen stress.  Sit up straight. Do not tighten (tense) your muscles.  Do not use tobacco products. This includes cigarettes, chewing tobacco, or e-cigarettes. If you need help quitting, ask your doctor.  Exercise regularly as told by your doctor.  Get enough sleep. This often means 7-9 hours of sleep. Contact a doctor if:  Your symptoms are not helped by medicine.  You have a headache that feels different than the other headaches.  You feel sick to your stomach (nauseous) or you throw up (vomit).  You have a fever. Get help right away if:  Your headache becomes really bad.  You keep throwing up.  You have a stiff neck.  You have trouble seeing.  You have trouble speaking.  You have pain in the eye or ear.  Your muscles are weak or you lose muscle control.  You lose your balance or have trouble walking.  You feel like you will pass out (faint) or you pass out.  You have confusion. This information is not  intended to replace advice given to you by your health care provider. Make sure you discuss any questions you have with your health care provider. Document Released: 06/28/2008 Document Revised: 02/25/2016 Document Reviewed: 01/12/2015 Elsevier Interactive Patient Education  2018 Elsevier Inc.  

## 2017-12-19 ENCOUNTER — Inpatient Hospital Stay (HOSPITAL_COMMUNITY)
Admission: AD | Admit: 2017-12-19 | Discharge: 2017-12-19 | Disposition: A | Discharge status: Home / Self Care | Payer: Medicaid Other | Source: Ambulatory Visit | Attending: Family Medicine | Admitting: Family Medicine

## 2017-12-19 ENCOUNTER — Encounter (HOSPITAL_COMMUNITY): Payer: Self-pay

## 2017-12-19 DIAGNOSIS — B9689 Other specified bacterial agents as the cause of diseases classified elsewhere: Secondary | ICD-10-CM

## 2017-12-19 DIAGNOSIS — N760 Acute vaginitis: Secondary | ICD-10-CM | POA: Diagnosis not present

## 2017-12-19 DIAGNOSIS — R109 Unspecified abdominal pain: Secondary | ICD-10-CM | POA: Diagnosis present

## 2017-12-19 LAB — COMPREHENSIVE METABOLIC PANEL
ALBUMIN: 4.2 g/dL (ref 3.5–5.0)
ALK PHOS: 47 U/L (ref 38–126)
ALT: 20 U/L (ref 14–54)
AST: 17 U/L (ref 15–41)
Anion gap: 8 (ref 5–15)
BILIRUBIN TOTAL: 0.7 mg/dL (ref 0.3–1.2)
BUN: 11 mg/dL (ref 6–20)
CALCIUM: 8.8 mg/dL — AB (ref 8.9–10.3)
CO2: 22 mmol/L (ref 22–32)
Chloride: 105 mmol/L (ref 101–111)
Creatinine, Ser: 0.76 mg/dL (ref 0.44–1.00)
GFR calc Af Amer: >60 mL/min (ref >60)
GFR calc non Af Amer: >60 mL/min (ref >60)
GLUCOSE: 97 mg/dL (ref 65–99)
Potassium: 4 mmol/L (ref 3.5–5.1)
Sodium: 135 mmol/L (ref 135–145)
TOTAL PROTEIN: 7 g/dL (ref 6.5–8.1)

## 2017-12-19 LAB — WET PREP, GENITAL
Sperm: NONE SEEN
Trich, Wet Prep: NONE SEEN
Yeast Wet Prep HPF POC: NONE SEEN

## 2017-12-19 LAB — URINALYSIS, ROUTINE W REFLEX MICROSCOPIC
BILIRUBIN URINE: NEGATIVE
Glucose, UA: NEGATIVE mg/dL
HGB URINE DIPSTICK: NEGATIVE
Ketones, ur: NEGATIVE mg/dL
Leukocytes, UA: NEGATIVE
Nitrite: NEGATIVE
Protein, ur: NEGATIVE mg/dL
SPECIFIC GRAVITY, URINE: 1.019 (ref 1.005–1.030)
pH: 5 (ref 5.0–8.0)

## 2017-12-19 LAB — CBC WITH DIFFERENTIAL/PLATELET
BASOS ABS: 0 10*3/uL (ref 0.0–0.1)
BASOS PCT: 1 %
Eosinophils Absolute: 0.1 10*3/uL (ref 0.0–0.7)
Eosinophils Relative: 1 %
HEMATOCRIT: 34.3 % — AB (ref 36.0–46.0)
Hemoglobin: 11.1 g/dL — ABNORMAL LOW (ref 12.0–15.0)
Lymphocytes Relative: 28 %
Lymphs Abs: 2.5 10*3/uL (ref 0.7–4.0)
MCH: 25.9 pg — ABNORMAL LOW (ref 26.0–34.0)
MCHC: 32.4 g/dL (ref 30.0–36.0)
MCV: 80.1 fL (ref 78.0–100.0)
MONOS PCT: 3 %
Monocytes Absolute: 0.2 10*3/uL (ref 0.1–1.0)
NEUTROS ABS: 6.1 10*3/uL (ref 1.7–7.7)
Neutrophils Relative %: 67 %
Platelets: 270 10*3/uL (ref 150–400)
RBC: 4.28 MIL/uL (ref 3.87–5.11)
RDW: 16.2 % — ABNORMAL HIGH (ref 11.5–15.5)
WBC: 8.9 10*3/uL (ref 4.0–10.5)

## 2017-12-19 LAB — POCT PREGNANCY, URINE: PREG TEST UR: NEGATIVE

## 2017-12-19 MED ORDER — KETOROLAC TROMETHAMINE 60 MG/2ML IM SOLN
60.0000 mg | Freq: Once | INTRAMUSCULAR | Status: AC
  Administered 2017-12-19: 60 mg via INTRAMUSCULAR
  Filled 2017-12-19: qty 2

## 2017-12-19 MED ORDER — CLINDAMYCIN PHOSPHATE 2 % VA CREA: 1.0000 | TOPICAL_CREAM | Freq: Every day | VAGINAL | 0 refills | Status: AC

## 2017-12-19 NOTE — MAU Note (Signed)
Pt states she had a TAB on 11/17/2017. States she did not have any problems other than some light bleeding. States about 2 weeks later she started having cramping, but started to get worse on 12/11/2017. Pt states she is not currently bleeding. States she was not given RX. Has not taken anything for pain. Rates 8/10. States she has a follow up scheduled for next week. States she has not had a period since September. States she has irregular periods.

## 2017-12-19 NOTE — Discharge Instructions (Signed)
In late 2019, the [facility] will be moving to the [place] campus. At that time, the MAU (Maternity Admissions Unit), where you are being seen today, will no longer take care of non-pregnant patients. We strongly encourage you to find a doctor's office before that time, so that you can be seen with any GYN concerns, like vaginal discharge, urinary tract infection, etc.. in a timely manner. ° °In order to make an office visit more convenient, the [facility] will be offering evening hours with same-day appointments, walk-in appointments and scheduled appointments available during this time. ° °[facility] Hours: °Monday - 8am - 7:30 pm with walk-in between 4pm- 7:30 pm °Tuesday - 8 am - 5 pm (starting 01/02/18 we will be open late and accepting walk-ins from 4pm - 7:30pm) °Wednesday - 8 am - 5 pm (starting 04/04/18 we will be open late and accepting walk-ins from 4pm - 7:30pm) °Thursday 8 am - 5 pm (starting 07/05/18 we will be open late and accepting walk-ins from 4pm - 7:30pm) °Friday 8 am - 5 pm ° °For an appointment please call the [facility] at [phone] ° °For urgent needs, [facility] is also available for management of urgent GYN complaints such as vaginal discharge or urinary tract infections. ° ° ° ° ° °

## 2017-12-19 NOTE — MAU Provider Note (Signed)
History     CSN: [id number]  Arrival date and time: 12/19/17 [id number]   First Provider Initiated Contact with Patient 12/19/17 2044      Chief Complaint  Patient presents with  . Abdominal Pain   HPI  Ms.  Kathy Dean is a 37 y.o. year old [phone] non-pregnant female who presents to MAU reporting s/p TAB on 11/17/2017 with no complications following that procedure; only light bleeding. She states she had lower abdominal cramping 2 wks later. She hasn't taken anything for the pain, because she "doesn't believe in [place] medicine". She take CBD and black seed oils for pain; which are not working this time. She denies fever, chills, N/V/D.  Past Medical History:  Diagnosis Date  . Abnormal Pap smear    rpt was ok  . Anemia   . Chronic hypertension   . Headache(784.0)   . History of blood transfusion   . Hyperemesis gravidarum   . Pregnancy induced hypertension   . Preterm labor     Past Surgical History:  Procedure Laterality Date  . INDUCED ABORTION      Family History  Problem Relation Age of Onset  . Stroke Maternal Grandmother   . Cancer Maternal Grandfather   . Hypertension Mother   . Diabetes Mother   . Hypertension Father   . Other Neg Hx     Social History   Tobacco Use  . Smoking status: Never Smoker  . Smokeless tobacco: Never Used  Substance Use Topics  . Alcohol use: Yes    Alcohol/week: 10.5 oz    Types: 21 Standard drinks or equivalent per week    Comment: socially  . Drug use: Yes    Types: Marijuana    Comment: occasional    Allergies:  Allergies  Allergen Reactions  . Flagyl [Metronidazole] Swelling and Dermatitis    Patient states can take gel form.   . Latex Swelling and Dermatitis    Medications Prior to Admission  Medication Sig Dispense Refill Last Dose  . clindamycin (CLEOCIN) 2 % vaginal cream Place 1 Applicatorful vaginally at bedtime. (Patient not taking: Reported on 06/27/2017) 40 g 0 Not Taking  . metroNIDAZOLE (METROGEL)  0.75 % vaginal gel Place 1 Applicatorful vaginally at bedtime. Apply one applicatorful to vagina at bedtime for 5 days (Patient not taking: Reported on 06/27/2017) 70 g 1 Not Taking  . NON FORMULARY Take 5 drops by mouth 2 (two) times a week. CBD oil   Not Taking  . Nutritional Supplements (BOOST 100 CALORIE SMART) LIQD Take 8 oz by mouth 2 (two) times daily. (Patient not taking: Reported on 06/27/2017) 56 Bottle 0 Not Taking  . OVER THE COUNTER MEDICATION Take 5 mLs by mouth 2 (two) times daily. Black seed oil   Not Taking    Review of Systems  Constitutional: Negative.   HENT: Negative.   Eyes: Negative.   Respiratory: Negative.   Cardiovascular: Negative.   Gastrointestinal: Negative.   Endocrine: Negative.   Genitourinary: Positive for pelvic pain and vaginal discharge.  Musculoskeletal: Negative.   Skin: Negative.   Allergic/Immunologic: Negative.   Neurological: Negative.   Hematological: Negative.   Psychiatric/Behavioral: Negative.    Physical Exam   Blood pressure 111/81, pulse 82, temperature 98.6 F (37 C), temperature source Oral, resp. rate 17, height 5\' 1"  (1.549 m), weight 109 lb (49.4 kg), SpO2 100 %.  Physical Exam  Constitutional: She is oriented to person, place, and time. She appears well-developed and well-nourished.  HENT:  Head: Normocephalic and atraumatic.  Eyes: Pupils are equal, round, and reactive to light.  Neck: Normal range of motion.  Cardiovascular: Normal rate, regular rhythm and normal heart sounds.  Respiratory: Effort normal and breath sounds normal.  GI: Soft. Bowel sounds are normal.  Genitourinary:  Genitourinary Comments: Uterus: non-tender, SE: cervix is smooth, pink, no lesions, moderate amt of thick, malodorous, white vaginal d/c -- WP, GC/CT done, closed/long/firm, no CMT or friability, mild bilateral adnexal tenderness   Musculoskeletal: Normal range of motion.  Neurological: She is alert and oriented to person, place, and time.   Skin: Skin is warm and dry.  Psychiatric: She has a normal mood and affect. Her behavior is normal. Judgment and thought content normal.   MAU Course  Procedures  MDM CCUA UPT CBC w/Diff ABO/Rh Wet Prep GC/CT -- pending HIV -- pending Toradol 60 mg IM --   Results for orders placed or performed during the hospital encounter of 12/19/17 (from the past 24 hour(s))  Urinalysis, Routine w reflex microscopic     Status: None   Collection Time: 12/19/17  8:14 PM  Result Value Ref Range   Color, Urine YELLOW YELLOW   APPearance CLEAR CLEAR   Specific Gravity, Urine 1.019 1.005 - 1.030   pH 5.0 5.0 - 8.0   Glucose, UA NEGATIVE NEGATIVE mg/dL   Hgb urine dipstick NEGATIVE NEGATIVE   Bilirubin Urine NEGATIVE NEGATIVE   Ketones, ur NEGATIVE NEGATIVE mg/dL   Protein, ur NEGATIVE NEGATIVE mg/dL   Nitrite NEGATIVE NEGATIVE   Leukocytes, UA NEGATIVE NEGATIVE  Pregnancy, urine POC     Status: None   Collection Time: 12/19/17  8:21 PM  Result Value Ref Range   Preg Test, Ur NEGATIVE NEGATIVE  Wet prep, genital     Status: Abnormal   Collection Time: 12/19/17  8:55 PM  Result Value Ref Range   Yeast Wet Prep HPF POC NONE SEEN NONE SEEN   Trich, Wet Prep NONE SEEN NONE SEEN   Clue Cells Wet Prep HPF POC PRESENT (A) NONE SEEN   WBC, Wet Prep HPF POC MODERATE (A) NONE SEEN   Sperm NONE SEEN     Assessment and Plan  Bacterial vaginitis  - Rx for Cleocin cream vaginally x 7 days - Information provided on BV - Advised to keep F/U appt with [facility] on 12/21/2017  - Discharge patient - Advised to establish care with either [facility], [facility], or [facility] - Patient verbalized an understanding of the plan of care and agrees.    Raelyn Mora, MSN, CNM 12/19/2017, 8:56 PM

## 2017-12-20 LAB — HIV ANTIBODY (ROUTINE TESTING W REFLEX): HIV SCREEN 4TH GENERATION: NONREACTIVE

## 2017-12-20 LAB — GC/CHLAMYDIA PROBE AMP (~~LOC~~) NOT AT ARMC
Chlamydia: NEGATIVE
NEISSERIA GONORRHEA: NEGATIVE

## 2018-05-14 ENCOUNTER — Inpatient Hospital Stay (HOSPITAL_COMMUNITY)
Admission: AD | Admit: 2018-05-14 | Discharge: 2018-05-14 | Disposition: A | Discharge status: Home / Self Care | Payer: Medicaid Other | Source: Ambulatory Visit | Attending: Obstetrics and Gynecology | Admitting: Obstetrics and Gynecology

## 2018-05-14 ENCOUNTER — Inpatient Hospital Stay (HOSPITAL_COMMUNITY): Payer: Medicaid Other

## 2018-05-14 ENCOUNTER — Encounter (HOSPITAL_COMMUNITY): Payer: Self-pay | Admitting: *Deleted

## 2018-05-14 DIAGNOSIS — Z883 Allergy status to other anti-infective agents status: Secondary | ICD-10-CM | POA: Insufficient documentation

## 2018-05-14 DIAGNOSIS — Z91040 Latex allergy status: Secondary | ICD-10-CM | POA: Insufficient documentation

## 2018-05-14 DIAGNOSIS — O09521 Supervision of elderly multigravida, first trimester: Secondary | ICD-10-CM | POA: Insufficient documentation

## 2018-05-14 DIAGNOSIS — O3680X0 Pregnancy with inconclusive fetal viability, not applicable or unspecified: Secondary | ICD-10-CM | POA: Diagnosis not present

## 2018-05-14 DIAGNOSIS — Z8249 Family history of ischemic heart disease and other diseases of the circulatory system: Secondary | ICD-10-CM | POA: Diagnosis not present

## 2018-05-14 DIAGNOSIS — O26891 Other specified pregnancy related conditions, first trimester: Secondary | ICD-10-CM | POA: Diagnosis not present

## 2018-05-14 DIAGNOSIS — Z3A00 Weeks of gestation of pregnancy not specified: Secondary | ICD-10-CM | POA: Diagnosis not present

## 2018-05-14 DIAGNOSIS — Z833 Family history of diabetes mellitus: Secondary | ICD-10-CM | POA: Diagnosis not present

## 2018-05-14 DIAGNOSIS — O283 Abnormal ultrasonic finding on antenatal screening of mother: Secondary | ICD-10-CM | POA: Diagnosis not present

## 2018-05-14 DIAGNOSIS — N898 Other specified noninflammatory disorders of vagina: Secondary | ICD-10-CM | POA: Diagnosis not present

## 2018-05-14 DIAGNOSIS — R109 Unspecified abdominal pain: Secondary | ICD-10-CM | POA: Diagnosis not present

## 2018-05-14 DIAGNOSIS — R1030 Lower abdominal pain, unspecified: Secondary | ICD-10-CM | POA: Insufficient documentation

## 2018-05-14 DIAGNOSIS — Z3A01 Less than 8 weeks gestation of pregnancy: Secondary | ICD-10-CM | POA: Diagnosis not present

## 2018-05-14 DIAGNOSIS — O26899 Other specified pregnancy related conditions, unspecified trimester: Secondary | ICD-10-CM

## 2018-05-14 LAB — CBC
HEMATOCRIT: 33.3 % — AB (ref 36.0–46.0)
Hemoglobin: 11.1 g/dL — ABNORMAL LOW (ref 12.0–15.0)
MCH: 27.3 pg (ref 26.0–34.0)
MCHC: 33.3 g/dL (ref 30.0–36.0)
MCV: 81.8 fL (ref 78.0–100.0)
PLATELETS: 264 10*3/uL (ref 150–400)
RBC: 4.07 MIL/uL (ref 3.87–5.11)
RDW: 14.8 % (ref 11.5–15.5)
WBC: 6.2 10*3/uL (ref 4.0–10.5)

## 2018-05-14 LAB — URINALYSIS, ROUTINE W REFLEX MICROSCOPIC
BILIRUBIN URINE: NEGATIVE
GLUCOSE, UA: NEGATIVE mg/dL
HGB URINE DIPSTICK: NEGATIVE
Ketones, ur: NEGATIVE mg/dL
Leukocytes, UA: NEGATIVE
Nitrite: NEGATIVE
PH: 7 (ref 5.0–8.0)
Protein, ur: NEGATIVE mg/dL
SPECIFIC GRAVITY, URINE: 1.018 (ref 1.005–1.030)

## 2018-05-14 LAB — WET PREP, GENITAL
CLUE CELLS WET PREP: NONE SEEN
SPERM: NONE SEEN
Trich, Wet Prep: NONE SEEN
YEAST WET PREP: NONE SEEN

## 2018-05-14 LAB — POCT PREGNANCY, URINE: PREG TEST UR: POSITIVE — AB

## 2018-05-14 LAB — HCG, QUANTITATIVE, PREGNANCY: hCG, Beta Chain, Quant, S: 174 m[IU]/mL — ABNORMAL HIGH (ref <5)

## 2018-05-14 LAB — HEPATITIS B SURFACE ANTIGEN: HEP B S AG: NEGATIVE

## 2018-05-14 NOTE — Discharge Instructions (Signed)

## 2018-05-14 NOTE — MAU Note (Signed)
Pt C/O lower abd pain for the last month, had 2 days of bleeding on July 10, hasn't had period in August yet.  Neg HPT last week.  Has increased discharge, thinks she may have yeast infection.

## 2018-05-14 NOTE — MAU Provider Note (Signed)
History     CSN: [id number]  Arrival date and time: 05/14/18 [id number]   First Provider Initiated Contact with Patient 05/14/18 1311     Chief Complaint  Patient presents with  . Abdominal Pain  . Vaginal Discharge   HPI Kathy Dean is a 37 y.o. [id number] at 163w5d who presents with lower abdominal pain and vaginal discharge. She reports a clumpy white discharge for the last 3 days that she thought might be yeast. She reports a lower abdominal cramping that she rates a 4/10 for the last week. She denies any vaginal bleeding or dysuria. LMP early July and only lasted 2 days. She has not taken a pregnancy test.   OB History    Gravida  8   Para  4   Term  1   Preterm  3   AB  3   Living  4     SAB      TAB  3   Ectopic      Multiple      Live Births  1           Past Medical History:  Diagnosis Date  . Abnormal Pap smear    rpt was ok  . Anemia   . Chronic hypertension   . Headache(784.0)   . History of blood transfusion   . Hyperemesis gravidarum   . Pregnancy induced hypertension   . Preterm labor     Past Surgical History:  Procedure Laterality Date  . INDUCED ABORTION      Family History  Problem Relation Age of Onset  . Stroke Maternal Grandmother   . Cancer Maternal Grandfather   . Hypertension Mother   . Diabetes Mother   . Hypertension Father   . Other Neg Hx     Social History   Tobacco Use  . Smoking status: Never Smoker  . Smokeless tobacco: Never Used  Substance Use Topics  . Alcohol use: Yes    Alcohol/week: 21.0 standard drinks    Types: 21 Standard drinks or equivalent per week    Comment: socially  . Drug use: Yes    Types: Marijuana    Comment: occasional-05/14/2018 last use    Allergies:  Allergies  Allergen Reactions  . Flagyl [Metronidazole] Swelling and Dermatitis    Patient states can take gel form.   . Latex Swelling and Dermatitis    Medications Prior to Admission  Medication Sig Dispense Refill Last  Dose  . NON FORMULARY Take 5 drops by mouth 2 (two) times a week. CBD oil   Not Taking  . Nutritional Supplements (BOOST 100 CALORIE SMART) LIQD Take 8 oz by mouth 2 (two) times daily. (Patient not taking: Reported on 06/27/2017) 56 Bottle 0 Not Taking  . OVER THE COUNTER MEDICATION Take 5 mLs by mouth 2 (two) times daily. Black seed oil   Not Taking    Review of Systems  Constitutional: Negative.  Negative for fatigue and fever.  HENT: Negative.   Respiratory: Negative.  Negative for shortness of breath.   Cardiovascular: Negative.  Negative for chest pain.  Gastrointestinal: Positive for abdominal pain. Negative for constipation, diarrhea, nausea and vomiting.  Genitourinary: Positive for vaginal discharge. Negative for dysuria and vaginal bleeding.  Neurological: Negative.  Negative for dizziness and headaches.   Physical Exam   Blood pressure 112/88, pulse 73, temperature 98.8 F (37.1 C), temperature source Oral, resp. rate 16, height 5\' 2"  (1.575 m), weight 54 kg, last menstrual period 04/11/2018.  Physical Exam  Nursing note and vitals reviewed. Constitutional: She is oriented to person, place, and time. She appears well-developed and well-nourished. No distress.  HENT:  Head: Normocephalic.  Eyes: Pupils are equal, round, and reactive to light.  Cardiovascular: Normal rate, regular rhythm and normal heart sounds.  Respiratory: Effort normal and breath sounds normal. No respiratory distress.  GI: Soft. Bowel sounds are normal. She exhibits no distension. There is no tenderness. There is no rebound and no guarding.  Genitourinary:  Genitourinary Comments: Pelvic exam: Cervix pink, visually closed, without lesion, scant white creamy discharge, vaginal walls and external genitalia normal Bimanual exam: Cervix 0/long/high, firm, anterior, neg CMT, uterus nontender, nonenlarged, adnexa without tenderness, enlargement, or mass  Neurological: She is alert and oriented to person, place,  and time.  Skin: Skin is warm and dry.  Psychiatric: She has a normal mood and affect. Her behavior is normal. Judgment and thought content normal.    MAU Course  Procedures Results for orders placed or performed during the hospital encounter of 05/14/18 (from the past 24 hour(s))  Urinalysis, Routine w reflex microscopic     Status: None   Collection Time: 05/14/18 10:12 AM  Result Value Ref Range   Color, Urine YELLOW YELLOW   APPearance CLEAR CLEAR   Specific Gravity, Urine 1.018 1.005 - 1.030   pH 7.0 5.0 - 8.0   Glucose, UA NEGATIVE NEGATIVE mg/dL   Hgb urine dipstick NEGATIVE NEGATIVE   Bilirubin Urine NEGATIVE NEGATIVE   Ketones, ur NEGATIVE NEGATIVE mg/dL   Protein, ur NEGATIVE NEGATIVE mg/dL   Nitrite NEGATIVE NEGATIVE   Leukocytes, UA NEGATIVE NEGATIVE  Pregnancy, urine POC     Status: Abnormal   Collection Time: 05/14/18 10:14 AM  Result Value Ref Range   Preg Test, Ur POSITIVE (A) NEGATIVE  CBC     Status: Abnormal   Collection Time: 05/14/18 10:36 AM  Result Value Ref Range   WBC 6.2 4.0 - 10.5 K/uL   RBC 4.07 3.87 - 5.11 MIL/uL   Hemoglobin 11.1 (L) 12.0 - 15.0 g/dL   HCT 16.1 (L) 09.6 - 04.5 %   MCV 81.8 78.0 - 100.0 fL   MCH 27.3 26.0 - 34.0 pg   MCHC 33.3 30.0 - 36.0 g/dL   RDW 40.9 81.1 - 91.4 %   Platelets 264 150 - 400 K/uL  hCG, quantitative, pregnancy     Status: Abnormal   Collection Time: 05/14/18 10:36 AM  Result Value Ref Range   hCG, Beta Chain, Quant, S 174 (H) <5 mIU/mL  Wet prep, genital     Status: Abnormal   Collection Time: 05/14/18  1:30 PM  Result Value Ref Range   Yeast Wet Prep HPF POC NONE SEEN NONE SEEN   Trich, Wet Prep NONE SEEN NONE SEEN   Clue Cells Wet Prep HPF POC NONE SEEN NONE SEEN   WBC, Wet Prep HPF POC FEW (A) NONE SEEN   Sperm NONE SEEN    US Ob Less Than 14 Weeks With Ob Transvaginal  Result Date: 05/14/2018 CLINICAL DATA:  Abdominal pain EXAM: OBSTETRIC <14 WK [place] AND TRANSVAGINAL OB US TECHNIQUE: Both  transabdominal and transvaginal ultrasound examinations were performed for complete evaluation of the gestation as well as the maternal uterus, adnexal regions, and pelvic cul-de-sac. Transvaginal technique was performed to assess early pregnancy. COMPARISON:  None. FINDINGS: Intrauterine gestational sac: None Yolk sac:  Not visualized Embryo:  Not visualized Cardiac Activity: Not visualized Heart Rate:   bpm MSD:  mm    w     d CRL:    mm    w    d                  US EDC: Subchorionic hemorrhage:  None visualized. Maternal uterus/adnexae: Endometrium thickness 23 mm. No adnexal mass. Small amount of free fluid in the pelvis. IMPRESSION: No intrauterine pregnancy visualized. Differential considerations would include early intrauterine pregnancy too early to visualize, spontaneous abortion, or occult ectopic pregnancy. Recommend close clinical followup and serial quantitative beta HCGs and ultrasounds. Electronically Signed   By: Charlett NoseKevin  Dover M.D.   On: 05/14/2018 13:05   MDM UA, UPT CBC, HCG, ABO/Rh B Pos Wet prep and gc/chlamydia US OB Comp Less 14 weeks with Transvaginal  Patient requested STD screening by blood work.  HIV, Hep B, Hep C, RPR  Assessment and Plan   1. Pregnancy of unknown anatomic location   2. Abdominal pain affecting pregnancy   3. [redacted] weeks gestation of pregnancy    -Discharge home in stable condition -Patient to return on Wednesday 8/14 at    For repeat HCG -Strict ectopic precautions discussed -Patient may return to MAU as needed or if her condition were to change or worsen  Rolm BookbinderCaroline M Neill CNM 05/14/2018, 1:11 PM

## 2018-05-15 LAB — RPR: RPR Ser Ql: NONREACTIVE

## 2018-05-15 LAB — HEPATITIS C ANTIBODY: HCV AB: 0.3 {s_co_ratio} (ref 0.0–0.9)

## 2018-05-15 LAB — GC/CHLAMYDIA PROBE AMP (~~LOC~~) NOT AT ARMC
CHLAMYDIA, DNA PROBE: NEGATIVE
NEISSERIA GONORRHEA: NEGATIVE

## 2018-05-16 ENCOUNTER — Ambulatory Visit (INDEPENDENT_AMBULATORY_CARE_PROVIDER_SITE_OTHER): Payer: Medicaid Other | Admitting: General Practice

## 2018-05-16 DIAGNOSIS — O3680X0 Pregnancy with inconclusive fetal viability, not applicable or unspecified: Secondary | ICD-10-CM

## 2018-05-16 DIAGNOSIS — O283 Abnormal ultrasonic finding on antenatal screening of mother: Secondary | ICD-10-CM

## 2018-05-16 LAB — HCG, QUANTITATIVE, PREGNANCY: HCG, BETA CHAIN, QUANT, S: 319 m[IU]/mL — AB (ref <5)

## 2018-05-16 NOTE — Progress Notes (Addendum)
Patient presents to the office today for stat bhcg. Patient reports occasional cramping & reports light bleeding that was dark yesterday. Discussed with patient we are monitoring your bhcg levels today & asked she wait in the lobby for results/updated plan of care. Patient verbalized understanding & had no questions at this time.  Reviewed results with Dr Erin FullingHarraway Smith who finds increasing bhcg levels- patient is early pregnant. Patient needs follow up ultrasound & stat bhcg in 2 weeks. Scheduled ultrasound for 8/30 @ 9am.  Informed patient of results. Discussed she will come to our office prior to u/s for stat bhcg, go to u/s appt and return to our office after for all results. Patient verbalized understanding & asked about other results from MAU. Informed patient of negative results. Patient verbalized understanding & had no questions.  Attestation of Attending Supervision of RN: Evaluation and management procedures were performed by the nurse under my supervision and collaboration.  I have reviewed the nursing note and chart, and I agree with the management and plan.  Carolyn L. Harraway-Smith, M.D., Evern CoreFACOG

## 2018-06-01 ENCOUNTER — Ambulatory Visit (INDEPENDENT_AMBULATORY_CARE_PROVIDER_SITE_OTHER): Payer: Medicaid Other | Admitting: *Deleted

## 2018-06-01 ENCOUNTER — Ambulatory Visit (HOSPITAL_COMMUNITY)
Admission: RE | Admit: 2018-06-01 | Discharge: 2018-06-01 | Disposition: A | Discharge status: Home / Self Care | Payer: Medicaid Other | Source: Ambulatory Visit | Attending: Obstetrics & Gynecology | Admitting: Obstetrics & Gynecology

## 2018-06-01 DIAGNOSIS — O283 Abnormal ultrasonic finding on antenatal screening of mother: Secondary | ICD-10-CM

## 2018-06-01 DIAGNOSIS — Z3A01 Less than 8 weeks gestation of pregnancy: Secondary | ICD-10-CM | POA: Insufficient documentation

## 2018-06-01 DIAGNOSIS — O208 Other hemorrhage in early pregnancy: Secondary | ICD-10-CM | POA: Diagnosis not present

## 2018-06-01 DIAGNOSIS — O3680X0 Pregnancy with inconclusive fetal viability, not applicable or unspecified: Secondary | ICD-10-CM

## 2018-06-01 DIAGNOSIS — O4691 Antepartum hemorrhage, unspecified, first trimester: Secondary | ICD-10-CM | POA: Diagnosis not present

## 2018-06-01 LAB — HCG, QUANTITATIVE, PREGNANCY: hCG, Beta Chain, Quant, S: 33309 m[IU]/mL — ABNORMAL HIGH (ref <5)

## 2018-06-01 NOTE — Progress Notes (Signed)
Here for stat bhcg. Denies pain. Denies bleeding today; states bled for about a week and finished Saturday 05/26/18.  Explained we will draw stat bhcg and have her wait in lobby for results which we will then discuss with provider and then her. She voices understanding.  Reviewed results with Dr. Alysia PennaErvin and informed patient appropriate rise in bhcg and that us shows live baby 6 weeks. Recommend us in 2 weeks which she agreed to and I scheduled. Advised to start prenatal vitamins and to stop cbd oil and black seed oil. She voices understanding.

## 2018-06-05 NOTE — Progress Notes (Signed)
Agree with A & P. 

## 2018-06-12 ENCOUNTER — Other Ambulatory Visit: Payer: Self-pay

## 2018-06-12 ENCOUNTER — Encounter (HOSPITAL_COMMUNITY): Payer: Self-pay

## 2018-06-12 ENCOUNTER — Inpatient Hospital Stay (HOSPITAL_COMMUNITY)
Admission: AD | Admit: 2018-06-12 | Discharge: 2018-06-12 | Disposition: A | Discharge status: Home / Self Care | Payer: Medicaid Other | Source: Ambulatory Visit | Attending: Obstetrics & Gynecology | Admitting: Obstetrics & Gynecology

## 2018-06-12 DIAGNOSIS — O219 Vomiting of pregnancy, unspecified: Secondary | ICD-10-CM

## 2018-06-12 DIAGNOSIS — O210 Mild hyperemesis gravidarum: Secondary | ICD-10-CM | POA: Insufficient documentation

## 2018-06-12 DIAGNOSIS — Z79899 Other long term (current) drug therapy: Secondary | ICD-10-CM | POA: Diagnosis not present

## 2018-06-12 DIAGNOSIS — Z3A08 8 weeks gestation of pregnancy: Secondary | ICD-10-CM | POA: Insufficient documentation

## 2018-06-12 DIAGNOSIS — Z881 Allergy status to other antibiotic agents status: Secondary | ICD-10-CM | POA: Diagnosis not present

## 2018-06-12 DIAGNOSIS — Z8249 Family history of ischemic heart disease and other diseases of the circulatory system: Secondary | ICD-10-CM | POA: Insufficient documentation

## 2018-06-12 DIAGNOSIS — O10011 Pre-existing essential hypertension complicating pregnancy, first trimester: Secondary | ICD-10-CM | POA: Diagnosis not present

## 2018-06-12 LAB — COMPREHENSIVE METABOLIC PANEL
ALK PHOS: 46 U/L (ref 38–126)
ALT: 19 U/L (ref 0–44)
AST: 23 U/L (ref 15–41)
Albumin: 4.3 g/dL (ref 3.5–5.0)
Anion gap: 10 (ref 5–15)
BILIRUBIN TOTAL: 1.2 mg/dL (ref 0.3–1.2)
BUN: 10 mg/dL (ref 6–20)
CALCIUM: 9.4 mg/dL (ref 8.9–10.3)
CO2: 20 mmol/L — ABNORMAL LOW (ref 22–32)
CREATININE: 0.62 mg/dL (ref 0.44–1.00)
Chloride: 104 mmol/L (ref 98–111)
GFR calc Af Amer: >60 mL/min (ref >60)
GFR calc non Af Amer: >60 mL/min (ref >60)
GLUCOSE: 90 mg/dL (ref 70–99)
Potassium: 4.2 mmol/L (ref 3.5–5.1)
SODIUM: 134 mmol/L — AB (ref 135–145)
TOTAL PROTEIN: 7.2 g/dL (ref 6.5–8.1)

## 2018-06-12 LAB — URINALYSIS, ROUTINE W REFLEX MICROSCOPIC
Bilirubin Urine: NEGATIVE
Glucose, UA: NEGATIVE mg/dL
Hgb urine dipstick: NEGATIVE
Ketones, ur: 20 mg/dL — AB
LEUKOCYTES UA: NEGATIVE
NITRITE: NEGATIVE
PH: 7 (ref 5.0–8.0)
Protein, ur: 30 mg/dL — AB
SPECIFIC GRAVITY, URINE: 1.019 (ref 1.005–1.030)

## 2018-06-12 LAB — CBC WITH DIFFERENTIAL/PLATELET
BASOS PCT: 0 %
Basophils Absolute: 0 10*3/uL (ref 0.0–0.1)
EOS ABS: 0 10*3/uL (ref 0.0–0.7)
EOS PCT: 0 %
HCT: 35.6 % — ABNORMAL LOW (ref 36.0–46.0)
Hemoglobin: 12 g/dL (ref 12.0–15.0)
Lymphocytes Relative: 10 %
Lymphs Abs: 1.3 10*3/uL (ref 0.7–4.0)
MCH: 27.3 pg (ref 26.0–34.0)
MCHC: 33.7 g/dL (ref 30.0–36.0)
MCV: 81.1 fL (ref 78.0–100.0)
MONO ABS: 0.2 10*3/uL (ref 0.1–1.0)
Monocytes Relative: 2 %
Neutro Abs: 10.9 10*3/uL — ABNORMAL HIGH (ref 1.7–7.7)
Neutrophils Relative %: 88 %
PLATELETS: 319 10*3/uL (ref 150–400)
RBC: 4.39 MIL/uL (ref 3.87–5.11)
RDW: 14.7 % (ref 11.5–15.5)
WBC: 12.4 10*3/uL — ABNORMAL HIGH (ref 4.0–10.5)

## 2018-06-12 MED ORDER — LACTATED RINGERS IV BOLUS
1000.0000 mL | Freq: Once | INTRAVENOUS | Status: AC
  Administered 2018-06-12: 1000 mL via INTRAVENOUS

## 2018-06-12 MED ORDER — FAMOTIDINE-CA CARB-MAG HYDROX 10-800-165 MG PO CHEW: 1.0000 | CHEWABLE_TABLET | Freq: Two times a day (BID) | ORAL | 2 refills | Status: DC | PRN

## 2018-06-12 MED ORDER — PROMETHAZINE HCL 25 MG PO TABS: 12.5000 mg | ORAL_TABLET | Freq: Four times a day (QID) | ORAL | 2 refills | Status: DC | PRN

## 2018-06-12 MED ORDER — PROMETHAZINE HCL 25 MG/ML IJ SOLN
25.0000 mg | Freq: Once | INTRAVENOUS | Status: AC
  Administered 2018-06-12: 25 mg via INTRAVENOUS
  Filled 2018-06-12: qty 1

## 2018-06-12 NOTE — Discharge Instructions (Signed)
Prenatal Care [facility]    [facility]  [phone]     Phone: [phone]          [facility]                      [facility]  @[place]     Phone: [phone]  Phone: [phone]         [facility]     Phone: [phone]  Phone: [phone]           [facility] [place]                hone: [phone]  Phone: [phone]         [facility] [address] [address]ise Ceo      Phone: [phone]  Phone: [phone]         [facility]                Phone: [phone]  [facility]   [phone]    Family [address] [place])          Phone: [phone] [facility]   Phone: [phone]   Morning Sickness Morning sickness is when you feel sick to your stomach (nauseous) during pregnancy. This nauseous feeling may or may not come with vomiting. It often occurs in the morning but can be a problem any time of day. Morning sickness is most common during the first trimester, but it may continue throughout pregnancy. While morning sickness is unpleasant, it is usually harmless unless you develop severe and continual vomiting (hyperemesis gravidarum). This condition requires more intense treatment. What are the causes? The cause of morning sickness is not completely known but seems to be related to normal hormonal changes that occur in pregnancy. What increases the risk? You are at greater risk if you:  Experienced nausea or vomiting before your pregnancy.  Had morning sickness during a previous pregnancy.  Are pregnant with more than one baby, such as twins.  How is this treated? Do not use any medicines (prescription, over-the-counter, or herbal) for morning sickness without first talking to your health care provider. Your health care provider may prescribe or  recommend:  Vitamin B6 supplements.  Anti-nausea medicines.  The herbal medicine ginger.  Follow these instructions at home:  Only take over-the-counter or prescription medicines as directed by your health care provider.  Taking multivitamins before getting pregnant can prevent or decrease the severity of morning sickness in most women.  Eat a piece of dry toast or unsalted crackers before getting out of bed in the morning.  Eat five or six small meals a day.  Eat dry and bland foods (rice, baked potato). Foods high in carbohydrates are often helpful.  Do not drink liquids with your meals. Drink liquids between meals.  Avoid greasy, fatty, and spicy foods.  Get someone to cook for you if the smell of any food causes nausea and vomiting.  If you feel nauseous after taking prenatal vitamins, take the vitamins at night or with a snack.  Snack on protein foods (nuts, yogurt, cheese) between meals if you are hungry.  Eat unsweetened gelatins for desserts.  Wearing an acupressure wristband (worn for sea sickness) may be helpful.  Acupuncture may be helpful.  Do not smoke.  Get a humidifier to keep the  air in your house free of odors.  Get plenty of fresh air. Contact a health care provider if:  Your home remedies are not working, and you need medicine.  You feel dizzy or lightheaded.  You are losing weight. Get help right away if:  You have persistent and uncontrolled nausea and vomiting.  You pass out (faint). This information is not intended to replace advice given to you by your health care provider. Make sure you discuss any questions you have with your health care provider. Document Released: 11/10/2006 Document Revised: 02/25/2016 Document Reviewed: 03/06/2013 Elsevier Interactive Patient Education  2017 [employer].

## 2018-06-12 NOTE — MAU Provider Note (Signed)
History     CSN: [id number]  Arrival date and time: 06/12/18 1200   First Provider Initiated Contact with Patient 06/12/18 1223      Chief Complaint  Patient presents with  . Morning Sickness   Kathy Dean is a 37 y.o. [id number] at [redacted]w[redacted]d who presents today with nausea and vomiting. She states that she has been vomiting all day since Saturday, and has not been able to keep anything down. Vomits about once per hour.   Emesis   This is a new problem. The current episode started in the past 7 days. The problem occurs more than 10 times per day. The problem has been unchanged. The emesis has an appearance of bile. There has been no fever. Associated symptoms include diarrhea. Pertinent negatives include no chills, fever or headaches. Risk factors: pregnancy  Treatments tried: mylanta  The treatment provided no relief.    OB History    Gravida  8   Para  4   Term  1   Preterm  3   AB  3   Living  4     SAB      TAB  3   Ectopic      Multiple      Live Births  1           Past Medical History:  Diagnosis Date  . Abnormal Pap smear    rpt was ok  . Anemia   . Chronic hypertension   . Headache(784.0)   . History of blood transfusion   . Hyperemesis gravidarum   . Pregnancy induced hypertension   . Preterm labor     Past Surgical History:  Procedure Laterality Date  . INDUCED ABORTION      Family History  Problem Relation Age of Onset  . Stroke Maternal Grandmother   . Cancer Maternal Grandfather   . Hypertension Mother   . Diabetes Mother   . Hypertension Father   . Other Neg Hx     Social History   Tobacco Use  . Smoking status: Never Smoker  . Smokeless tobacco: Never Used  Substance Use Topics  . Alcohol use: Yes    Alcohol/week: 21.0 standard drinks    Types: 21 Standard drinks or equivalent per week    Comment: socially  . Drug use: Yes    Types: Marijuana    Comment: occasional-05/14/2018 last use    Allergies:  Allergies   Allergen Reactions  . Flagyl [Metronidazole] Swelling and Dermatitis    Patient states can take gel form.   . Latex Swelling and Dermatitis    Medications Prior to Admission  Medication Sig Dispense Refill Last Dose  . NON FORMULARY Take 5 drops by mouth 2 (two) times a week. CBD oil   Taking  . Nutritional Supplements (BOOST 100 CALORIE SMART) LIQD Take 8 oz by mouth 2 (two) times daily. (Patient not taking: Reported on 06/27/2017) 56 Bottle 0 Not Taking  . OVER THE COUNTER MEDICATION Take 5 mLs by mouth 2 (two) times daily. Black seed oil   Taking    Review of Systems  Constitutional: Negative for chills and fever.  Gastrointestinal: Positive for diarrhea, nausea and vomiting.  Genitourinary: Negative for dysuria, frequency, pelvic pain, vaginal bleeding and vaginal discharge.  Neurological: Negative for headaches.   Physical Exam   Blood pressure 119/65, pulse 71, temperature 98.6 F (37 C), temperature source Oral, resp. rate 18, weight 52.2 kg, last menstrual period 04/11/2018, SpO2 99 %.  Physical Exam  Nursing note and vitals reviewed. Constitutional: She is oriented to person, place, and time. She appears well-developed and well-nourished. No distress.  HENT:  Head: Normocephalic.  Cardiovascular: Normal rate.  Respiratory: Effort normal.  GI: Soft. There is no tenderness. There is no rebound.  Neurological: She is alert and oriented to person, place, and time.  Skin: Skin is warm and dry.  Psychiatric: She has a normal mood and affect.   Results for orders placed or performed during the hospital encounter of 06/12/18 (from the past 24 hour(s))  Urinalysis, Routine w reflex microscopic     Status: Abnormal   Collection Time: 06/12/18 12:05 PM  Result Value Ref Range   Color, Urine YELLOW YELLOW   APPearance CLOUDY (A) CLEAR   Specific Gravity, Urine 1.019 1.005 - 1.030   pH 7.0 5.0 - 8.0   Glucose, UA NEGATIVE NEGATIVE mg/dL   Hgb urine dipstick NEGATIVE NEGATIVE    Bilirubin Urine NEGATIVE NEGATIVE   Ketones, ur 20 (A) NEGATIVE mg/dL   Protein, ur 30 (A) NEGATIVE mg/dL   Nitrite NEGATIVE NEGATIVE   Leukocytes, UA NEGATIVE NEGATIVE   RBC / HPF 0-5 0 - 5 RBC/hpf   WBC, UA 0-5 0 - 5 WBC/hpf   Bacteria, UA RARE (A) NONE SEEN   Squamous Epithelial / LPF 0-5 0 - 5   Mucus PRESENT   Comprehensive metabolic panel     Status: Abnormal   Collection Time: 06/12/18 12:32 PM  Result Value Ref Range   Sodium 134 (L) 135 - 145 mmol/L   Potassium 4.2 3.5 - 5.1 mmol/L   Chloride 104 98 - 111 mmol/L   CO2 20 (L) 22 - 32 mmol/L   Glucose, Bld 90 70 - 99 mg/dL   BUN 10 6 - 20 mg/dL   Creatinine, Ser 4.09 0.44 - 1.00 mg/dL   Calcium 9.4 8.9 - 81.1 mg/dL   Total Protein 7.2 6.5 - 8.1 g/dL   Albumin 4.3 3.5 - 5.0 g/dL   AST 23 15 - 41 U/L   ALT 19 0 - 44 U/L   Alkaline Phosphatase 46 38 - 126 U/L   Total Bilirubin 1.2 0.3 - 1.2 mg/dL   GFR calc non Af Amer >60 >60 mL/min   GFR calc Af Amer >60 >60 mL/min   Anion gap 10 5 - 15  CBC with Differential/Platelet     Status: Abnormal   Collection Time: 06/12/18 12:32 PM  Result Value Ref Range   WBC 12.4 (H) 4.0 - 10.5 K/uL   RBC 4.39 3.87 - 5.11 MIL/uL   Hemoglobin 12.0 12.0 - 15.0 g/dL   HCT 91.4 (L) 78.2 - 95.6 %   MCV 81.1 78.0 - 100.0 fL   MCH 27.3 26.0 - 34.0 pg   MCHC 33.7 30.0 - 36.0 g/dL   RDW 21.3 08.6 - 57.8 %   Platelets 319 150 - 400 K/uL   Neutrophils Relative % 88 %   Neutro Abs 10.9 (H) 1.7 - 7.7 K/uL   Lymphocytes Relative 10 %   Lymphs Abs 1.3 0.7 - 4.0 K/uL   Monocytes Relative 2 %   Monocytes Absolute 0.2 0.1 - 1.0 K/uL   Eosinophils Relative 0 %   Eosinophils Absolute 0.0 0.0 - 0.7 K/uL   Basophils Relative 0 %   Basophils Absolute 0.0 0.0 - 0.1 K/uL    MAU Course  Procedures  MDM Patient has had 2L of LR, 25mg  of phenergan. She has not vomited  since being here. She is tolerating PO.   Assessment and Plan   1. Nausea/vomiting in pregnancy   2. [redacted] weeks gestation of  pregnancy    DC home Comfort measures reviewed  1st Trimester precautions  Bleeding precautions RX: phenergan PRN #30 with 2RF, Pepcid complete BID PRN #60 with 2RF Return to MAU as needed Start [facility] as soon as possible   Follow-up Information    Department, [facility] Follow up.   Contact information: [address] [place] [address] [phone]            Thressa Sheller 06/12/2018, 12:24 PM

## 2018-06-12 NOTE — MAU Note (Signed)
Pt states N/V since 06/09/18. Has not been able to keep food down. Denies vag bleeding. Hx of hyperemesis with previous pregnancies.

## 2018-06-15 ENCOUNTER — Ambulatory Visit (HOSPITAL_COMMUNITY): Admission: RE | Admit: 2018-06-15 | Payer: Medicaid Other | Source: Ambulatory Visit

## 2018-06-15 ENCOUNTER — Ambulatory Visit: Payer: Medicaid Other

## 2018-06-23 ENCOUNTER — Inpatient Hospital Stay (HOSPITAL_COMMUNITY)
Admission: AD | Admit: 2018-06-23 | Discharge: 2018-06-23 | Disposition: A | Discharge status: Home / Self Care | Payer: Medicaid Other | Source: Ambulatory Visit | Attending: Obstetrics & Gynecology | Admitting: Obstetrics & Gynecology

## 2018-06-23 ENCOUNTER — Encounter (HOSPITAL_COMMUNITY): Payer: Self-pay | Admitting: *Deleted

## 2018-06-23 DIAGNOSIS — Z3A10 10 weeks gestation of pregnancy: Secondary | ICD-10-CM | POA: Diagnosis not present

## 2018-06-23 DIAGNOSIS — O210 Mild hyperemesis gravidarum: Secondary | ICD-10-CM | POA: Diagnosis not present

## 2018-06-23 DIAGNOSIS — O98811 Other maternal infectious and parasitic diseases complicating pregnancy, first trimester: Secondary | ICD-10-CM | POA: Diagnosis not present

## 2018-06-23 DIAGNOSIS — B3731 Acute candidiasis of vulva and vagina: Secondary | ICD-10-CM

## 2018-06-23 DIAGNOSIS — B373 Candidiasis of vulva and vagina: Secondary | ICD-10-CM | POA: Insufficient documentation

## 2018-06-23 LAB — WET PREP, GENITAL
Clue Cells Wet Prep HPF POC: NONE SEEN
Sperm: NONE SEEN
TRICH WET PREP: NONE SEEN

## 2018-06-23 LAB — URINALYSIS, ROUTINE W REFLEX MICROSCOPIC
BILIRUBIN URINE: NEGATIVE
Glucose, UA: NEGATIVE mg/dL
Hgb urine dipstick: NEGATIVE
KETONES UR: 80 mg/dL — AB
Nitrite: NEGATIVE
PROTEIN: 30 mg/dL — AB
Specific Gravity, Urine: 1.029 (ref 1.005–1.030)
pH: 6 (ref 5.0–8.0)

## 2018-06-23 MED ORDER — ONDANSETRON 8 MG PO TBDP: 8.0000 mg | ORAL_TABLET | Freq: Three times a day (TID) | ORAL | 0 refills | Status: DC | PRN

## 2018-06-23 MED ORDER — LACTATED RINGERS IV SOLN
Freq: Once | INTRAVENOUS | Status: AC
  Administered 2018-06-23: 12:00:00 via INTRAVENOUS
  Filled 2018-06-23: qty 1000

## 2018-06-23 MED ORDER — RANITIDINE HCL 150 MG PO TABS: 150.0000 mg | ORAL_TABLET | Freq: Two times a day (BID) | ORAL | 0 refills | Status: DC

## 2018-06-23 MED ORDER — FAMOTIDINE IN NACL 20-0.9 MG/50ML-% IV SOLN
20.0000 mg | Freq: Once | INTRAVENOUS | Status: AC
  Administered 2018-06-23: 20 mg via INTRAVENOUS
  Filled 2018-06-23: qty 50

## 2018-06-23 MED ORDER — SODIUM CHLORIDE 0.9 % IV SOLN
8.0000 mg | Freq: Once | INTRAVENOUS | Status: AC
  Administered 2018-06-23: 8 mg via INTRAVENOUS
  Filled 2018-06-23: qty 4

## 2018-06-23 MED ORDER — TERCONAZOLE 0.4 % VA CREA: 1.0000 | TOPICAL_CREAM | Freq: Every day | VAGINAL | 0 refills | Status: DC

## 2018-06-23 MED ORDER — DEXTROSE 5 % IN LACTATED RINGERS IV BOLUS
1000.0000 mL | Freq: Once | INTRAVENOUS | Status: AC
Start: 2018-06-23 — End: 2018-06-23
  Administered 2018-06-23: 1000 mL via INTRAVENOUS

## 2018-06-23 NOTE — MAU Provider Note (Signed)
History     CSN: [id number]  Arrival date and time: 06/23/18 8295   First Provider Initiated Contact with Patient 06/23/18 [phone]      Chief Complaint  Patient presents with  . Emesis  . Fatigue   HPI    Kathy Dean is a 37 y.o. female [id number] @[redacted]w[redacted]d  here in MAU with complaints of Nausea and vomiting. Says she has not been able to keep anything down in the last few days. She is taking phenergan randomly for the symptoms, last dose was yesterday morning. Helping only some. She has tried bland foods including apple sauce and she still vomits. + vaginal discharge. Grey in color. More than usual.   OB History    Gravida  8   Para  4   Term  1   Preterm  3   AB  3   Living  4     SAB      TAB  3   Ectopic      Multiple      Live Births  1           Past Medical History:  Diagnosis Date  . Abnormal Pap smear    rpt was ok  . Anemia   . Chronic hypertension   . Headache(784.0)   . History of blood transfusion   . Hyperemesis gravidarum   . Pregnancy induced hypertension   . Preterm labor     Past Surgical History:  Procedure Laterality Date  . INDUCED ABORTION      Family History  Problem Relation Age of Onset  . Stroke Maternal Grandmother   . Cancer Maternal Grandfather   . Hypertension Mother   . Diabetes Mother   . Hypertension Father   . Other Neg Hx     Social History   Tobacco Use  . Smoking status: Never Smoker  . Smokeless tobacco: Never Used  Substance Use Topics  . Alcohol use: Yes    Alcohol/week: 21.0 standard drinks    Types: 21 Standard drinks or equivalent per week    Comment: not since pregnancy  . Drug use: Yes    Types: Marijuana    Comment: "oils with my tea every day"    Allergies:  Allergies  Allergen Reactions  . Flagyl [Metronidazole] Swelling and Dermatitis    Patient states can take gel form.   . Latex Swelling and Dermatitis    Medications Prior to Admission  Medication Sig Dispense Refill  Last Dose  . famotidine-calcium carbonate-magnesium hydroxide (PEPCID COMPLETE) 10-800-165 MG chewable tablet Chew 1 tablet by mouth 2 (two) times daily as needed. 60 tablet 2   . NON FORMULARY Take 5 drops by mouth 2 (two) times a week. CBD oil   Taking  . Nutritional Supplements (BOOST 100 CALORIE SMART) LIQD Take 8 oz by mouth 2 (two) times daily. (Patient not taking: Reported on 06/27/2017) 56 Bottle 0 Not Taking  . OVER THE COUNTER MEDICATION Take 5 mLs by mouth 2 (two) times daily. Black seed oil   Taking  . promethazine (PHENERGAN) 25 MG tablet Take 0.5-1 tablets (12.5-25 mg total) by mouth every 6 (six) hours as needed. 30 tablet 2    Results for orders placed or performed during the hospital encounter of 06/23/18 (from the past 48 hour(s))  Urinalysis, Routine w reflex microscopic     Status: Abnormal   Collection Time: 06/23/18  9:38 AM  Result Value Ref Range   Color, Urine YELLOW YELLOW  APPearance HAZY (A) CLEAR   Specific Gravity, Urine 1.029 1.005 - 1.030   pH 6.0 5.0 - 8.0   Glucose, UA NEGATIVE NEGATIVE mg/dL   Hgb urine dipstick NEGATIVE NEGATIVE   Bilirubin Urine NEGATIVE NEGATIVE   Ketones, ur 80 (A) NEGATIVE mg/dL   Protein, ur 30 (A) NEGATIVE mg/dL   Nitrite NEGATIVE NEGATIVE   Leukocytes, UA TRACE (A) NEGATIVE   RBC / HPF 0-5 0 - 5 RBC/hpf   WBC, UA 6-10 0 - 5 WBC/hpf   Bacteria, UA RARE (A) NONE SEEN   Squamous Epithelial / LPF 0-5 0 - 5   Mucus PRESENT     Comment: Performed at [facility], [address] [place], [place] [address]  Wet prep, genital     Status: Abnormal   Collection Time: 06/23/18  9:48 AM  Result Value Ref Range   Yeast Wet Prep HPF POC PRESENT (A) NONE SEEN   Trich, Wet Prep NONE SEEN NONE SEEN   Clue Cells Wet Prep HPF POC NONE SEEN NONE SEEN   WBC, Wet Prep HPF POC MODERATE (A) NONE SEEN    Comment: MANY BACTERIA SEEN   Sperm NONE SEEN     Comment: Performed at [facility], [address] [place], [place] [address]     Review of Systems  Constitutional: Negative for fever.  Gastrointestinal: Positive for nausea and vomiting. Negative for abdominal pain.  Genitourinary: Positive for vaginal discharge. Negative for dysuria and vaginal bleeding.   Physical Exam   Blood pressure 106/65, pulse 72, temperature 98.2 F (36.8 C), temperature source Oral, resp. rate 16, weight 50.7 kg, last menstrual period 04/11/2018, SpO2 100 %.  Physical Exam  Constitutional: She is oriented to person, place, and time. She appears well-developed and well-nourished. She has a sickly appearance.  GI: Soft. She exhibits no distension. There is no tenderness. There is no rebound and no guarding.  Genitourinary:  Genitourinary Comments: Wet prep and GC collected without speculum   Musculoskeletal: Normal range of motion.  Neurological: She is alert and oriented to person, place, and time.  Skin: Skin is warm. She is not diaphoretic.  Psychiatric: Her behavior is normal.    MAU Course  Procedures  None  MDM  Urine shows >80 ketones D5LR bolus X 1 MVI bolus X 1 Zofran 8 mg IV & Pepcid 20 mg IV Wet prep shows Yeast.  Patient tolerating PO fluids with vomiting.   Assessment and Plan   A:  1. Hyperemesis gravidarum   2. Yeast vaginitis     P:  Discharge home in stable condition Rx: Zofran, Pepcid, Terazol 7 F/u with OB as scheduled  Return to MAU if symptoms worsen Small frequent meals   Rasch, Harolyn RutherfordJennifer I, NP 06/23/2018 4:45 PM

## 2018-06-23 NOTE — MAU Note (Signed)
Patient states she is [redacted] weeks pregnant and was seen in MAU for n/v and given Promethazine.  She states that was working for her n/v until Monday and she hasn't been able to keep anything down for past several days.  Reports vomiting 6+ times in past 24 hours.  C/o lower abdominal cramping 6/10.

## 2018-06-23 NOTE — Discharge Instructions (Signed)

## 2018-06-25 LAB — GC/CHLAMYDIA PROBE AMP (~~LOC~~) NOT AT ARMC
CHLAMYDIA, DNA PROBE: NEGATIVE
NEISSERIA GONORRHEA: NEGATIVE

## 2018-07-11 ENCOUNTER — Other Ambulatory Visit: Payer: Self-pay | Admitting: Obstetrics and Gynecology

## 2018-07-11 ENCOUNTER — Ambulatory Visit (INDEPENDENT_AMBULATORY_CARE_PROVIDER_SITE_OTHER): Payer: Medicaid Other | Admitting: General Practice

## 2018-07-11 ENCOUNTER — Ambulatory Visit (HOSPITAL_COMMUNITY)
Admission: RE | Admit: 2018-07-11 | Discharge: 2018-07-11 | Disposition: A | Discharge status: Home / Self Care | Payer: Medicaid Other | Source: Ambulatory Visit | Attending: Obstetrics and Gynecology | Admitting: Obstetrics and Gynecology

## 2018-07-11 DIAGNOSIS — O3680X0 Pregnancy with inconclusive fetal viability, not applicable or unspecified: Secondary | ICD-10-CM | POA: Insufficient documentation

## 2018-07-11 DIAGNOSIS — Z3A11 11 weeks gestation of pregnancy: Secondary | ICD-10-CM | POA: Insufficient documentation

## 2018-07-11 DIAGNOSIS — Z712 Person consulting for explanation of examination or test findings: Secondary | ICD-10-CM

## 2018-07-11 DIAGNOSIS — Z3A12 12 weeks gestation of pregnancy: Secondary | ICD-10-CM | POA: Diagnosis not present

## 2018-07-11 NOTE — Progress Notes (Signed)
Patient presents to office today for ultrasound results. Reviewed with Dorathy Kinsman who finds living IUP at 11.6 weeks, patient should begin prenatal care.  Informed patient of results, provided pictures, & reviewed dating. Recommended she begin OB care. Patient verbalized understanding & had no questions.

## 2018-07-12 NOTE — Progress Notes (Signed)
I reviewed the US and agree with the nursing assessment and plan.   Kaylynn Chamblin, CNM 12/29/2017 10:32 AM   

## 2018-07-14 ENCOUNTER — Inpatient Hospital Stay (HOSPITAL_COMMUNITY)
Admission: AD | Admit: 2018-07-14 | Discharge: 2018-07-14 | Disposition: A | Discharge status: Home / Self Care | Payer: Medicaid Other | Source: Ambulatory Visit | Attending: Family Medicine | Admitting: Family Medicine

## 2018-07-14 ENCOUNTER — Encounter (HOSPITAL_COMMUNITY): Payer: Self-pay

## 2018-07-14 ENCOUNTER — Other Ambulatory Visit: Payer: Self-pay

## 2018-07-14 DIAGNOSIS — Z3A13 13 weeks gestation of pregnancy: Secondary | ICD-10-CM | POA: Insufficient documentation

## 2018-07-14 DIAGNOSIS — O210 Mild hyperemesis gravidarum: Secondary | ICD-10-CM | POA: Insufficient documentation

## 2018-07-14 DIAGNOSIS — O219 Vomiting of pregnancy, unspecified: Secondary | ICD-10-CM

## 2018-07-14 LAB — CBC WITH DIFFERENTIAL/PLATELET
BASOS ABS: 0 10*3/uL (ref 0.0–0.1)
Basophils Relative: 0 %
EOS PCT: 2 %
Eosinophils Absolute: 0.2 10*3/uL (ref 0.0–0.5)
HCT: 33.2 % — ABNORMAL LOW (ref 36.0–46.0)
HEMOGLOBIN: 11.4 g/dL — AB (ref 12.0–15.0)
LYMPHS ABS: 1 10*3/uL (ref 0.7–4.0)
LYMPHS PCT: 11 %
MCH: 27.9 pg (ref 26.0–34.0)
MCHC: 34.3 g/dL (ref 30.0–36.0)
MCV: 81.2 fL (ref 80.0–100.0)
MONOS PCT: 2 %
Monocytes Absolute: 0.2 10*3/uL (ref 0.1–1.0)
NRBC: 0 % (ref 0.0–0.2)
Neutro Abs: 7.2 10*3/uL (ref 1.7–7.7)
Neutrophils Relative %: 85 %
Platelets: 298 10*3/uL (ref 150–400)
RBC: 4.09 MIL/uL (ref 3.87–5.11)
RDW: 14.9 % (ref 11.5–15.5)
WBC: 8.5 10*3/uL (ref 4.0–10.5)

## 2018-07-14 LAB — COMPREHENSIVE METABOLIC PANEL
ALBUMIN: 3.8 g/dL (ref 3.5–5.0)
ALK PHOS: 35 U/L — AB (ref 38–126)
ALT: 13 U/L (ref 0–44)
AST: 15 U/L (ref 15–41)
Anion gap: 9 (ref 5–15)
BUN: 8 mg/dL (ref 6–20)
CALCIUM: 8.9 mg/dL (ref 8.9–10.3)
CO2: 23 mmol/L (ref 22–32)
CREATININE: 0.57 mg/dL (ref 0.44–1.00)
Chloride: 102 mmol/L (ref 98–111)
GFR calc Af Amer: >60 mL/min (ref >60)
GFR calc non Af Amer: >60 mL/min (ref >60)
Glucose, Bld: 89 mg/dL (ref 70–99)
Potassium: 3.4 mmol/L — ABNORMAL LOW (ref 3.5–5.1)
SODIUM: 134 mmol/L — AB (ref 135–145)
Total Bilirubin: 1 mg/dL (ref 0.3–1.2)
Total Protein: 7 g/dL (ref 6.5–8.1)

## 2018-07-14 LAB — URINALYSIS, ROUTINE W REFLEX MICROSCOPIC
Bilirubin Urine: NEGATIVE
GLUCOSE, UA: NEGATIVE mg/dL
Hgb urine dipstick: NEGATIVE
Ketones, ur: 20 mg/dL — AB
LEUKOCYTES UA: NEGATIVE
NITRITE: NEGATIVE
PH: 6 (ref 5.0–8.0)
Protein, ur: 30 mg/dL — AB
SPECIFIC GRAVITY, URINE: 1.023 (ref 1.005–1.030)

## 2018-07-14 LAB — RAPID URINE DRUG SCREEN, HOSP PERFORMED
Amphetamines: NOT DETECTED
Barbiturates: NOT DETECTED
Benzodiazepines: NOT DETECTED
Cocaine: NOT DETECTED
Opiates: NOT DETECTED
Tetrahydrocannabinol: POSITIVE — AB

## 2018-07-14 LAB — LIPASE, BLOOD: LIPASE: 29 U/L (ref 11–51)

## 2018-07-14 LAB — AMYLASE: Amylase: 124 U/L — ABNORMAL HIGH (ref 28–100)

## 2018-07-14 MED ORDER — GLYCOPYRROLATE 0.2 MG/ML IJ SOLN
0.1000 mg | Freq: Once | INTRAMUSCULAR | Status: AC
  Administered 2018-07-14: 0.1 mg via INTRAVENOUS
  Filled 2018-07-14: qty 0.5

## 2018-07-14 MED ORDER — GLYCOPYRROLATE 1 MG PO TABS: 1.0000 mg | ORAL_TABLET | Freq: Three times a day (TID) | ORAL | 3 refills | Status: DC

## 2018-07-14 MED ORDER — BOOST HIGH PROTEIN PO LIQD: 1.0000 | Freq: Three times a day (TID) | ORAL | 6 refills | Status: DC

## 2018-07-14 MED ORDER — SCOPOLAMINE 1 MG/3DAYS TD PT72: 1.0000 | MEDICATED_PATCH | TRANSDERMAL | 12 refills | Status: DC

## 2018-07-14 MED ORDER — METOCLOPRAMIDE HCL 10 MG PO TABS: 10.0000 mg | ORAL_TABLET | Freq: Three times a day (TID) | ORAL | 3 refills | Status: DC

## 2018-07-14 MED ORDER — ONDANSETRON HCL 4 MG/2ML IJ SOLN
4.0000 mg | Freq: Once | INTRAMUSCULAR | Status: AC
  Administered 2018-07-14: 4 mg via INTRAVENOUS
  Filled 2018-07-14: qty 2

## 2018-07-14 MED ORDER — LACTATED RINGERS IV BOLUS
1000.0000 mL | Freq: Once | INTRAVENOUS | Status: AC
  Administered 2018-07-14: 1000 mL via INTRAVENOUS

## 2018-07-14 MED ORDER — FAMOTIDINE IN NACL 20-0.9 MG/50ML-% IV SOLN
20.0000 mg | Freq: Once | INTRAVENOUS | Status: AC
  Administered 2018-07-14: 20 mg via INTRAVENOUS
  Filled 2018-07-14: qty 50

## 2018-07-14 NOTE — Discharge Instructions (Signed)

## 2018-07-14 NOTE — MAU Provider Note (Signed)
History     CSN: [id number]  Arrival date and time: 07/14/18 1039   First Provider Initiated Contact with Patient 07/14/18 1119      Chief Complaint  Patient presents with  . Abdominal Pain  . Emesis   Kathy Dean is a 37 y.o. [id number] at [redacted]w[redacted]d who presents today with nausea and vomiting. She has had this since the beginning of her. She states that she has been seeing a natural healer, and she was started on CBD oil to help. She states that she has been using this throughout her pregnancy. She has also tried phenergan, zofran and "something" for acid. Had blood in her emesis x 1 yesterday. None since. Meds taken today: none. Meds taken yesterday: zofran x1, Pepcid.   Emesis   This is a new problem. The problem occurs 2 to 4 times per day. The problem has been unchanged. The emesis has an appearance of stomach contents and bright red blood. Associated symptoms include abdominal pain. Risk factors: pregnancy  Treatments tried: CBD, marjuana (one blunt per day), zofran, phenergan, acid reducer  The treatment provided no relief.  Abdominal Pain  This is a new problem. The current episode started yesterday. The onset quality is sudden. The problem occurs constantly. The pain is located in the generalized abdominal region and epigastric region. The abdominal pain radiates to the epigastric region. Associated symptoms include vomiting. The pain is aggravated by vomiting. The pain is relieved by nothing. She has tried antacids for the symptoms. The treatment provided no relief.    OB History    Gravida  8   Para  4   Term  1   Preterm  3   AB  3   Living  4     SAB      TAB  3   Ectopic      Multiple      Live Births  1           Past Medical History:  Diagnosis Date  . Abnormal Pap smear    rpt was ok  . Anemia   . Chronic hypertension   . Headache(784.0)   . History of blood transfusion   . Hyperemesis gravidarum   . Pregnancy induced hypertension   .  Preterm labor     Past Surgical History:  Procedure Laterality Date  . INDUCED ABORTION    . NO PAST SURGERIES      Family History  Problem Relation Age of Onset  . Stroke Maternal Grandmother   . Cancer Maternal Grandfather   . Hypertension Mother   . Diabetes Mother   . Hypertension Father   . Other Neg Hx     Social History   Tobacco Use  . Smoking status: Never Smoker  . Smokeless tobacco: Never Used  Substance Use Topics  . Alcohol use: Not Currently    Alcohol/week: 21.0 standard drinks    Types: 21 Standard drinks or equivalent per week    Comment: not since pregnancy  . Drug use: Yes    Types: Marijuana    Comment: pt states "one blunt per day"     Allergies:  Allergies  Allergen Reactions  . Flagyl [Metronidazole] Swelling and Dermatitis    Patient states can take gel form.   . Latex Swelling and Dermatitis    Medications Prior to Admission  Medication Sig Dispense Refill Last Dose  . famotidine-calcium carbonate-magnesium hydroxide (PEPCID COMPLETE) 10-800-165 MG chewable tablet Chew 1 tablet by  mouth 2 (two) times daily as needed. 60 tablet 2 Past Month at Unknown time  . Nutritional Supplements (BOOST 100 CALORIE SMART) LIQD Take 8 oz by mouth 2 (two) times daily. 56 Bottle 0 Past Week at Unknown time  . ondansetron (ZOFRAN ODT) 8 MG disintegrating tablet Take 1 tablet (8 mg total) by mouth every 8 (eight) hours as needed for nausea or vomiting. 20 tablet 0 07/13/2018 at Unknown time  . ranitidine (ZANTAC) 150 MG tablet Take 1 tablet (150 mg total) by mouth 2 (two) times daily. 60 tablet 0 Past Week at Unknown time  . terconazole (TERAZOL 7) 0.4 % vaginal cream Place 1 applicator vaginally at bedtime. 45 g 0 Past Month at Unknown time  . NON FORMULARY Take 5 drops by mouth 2 (two) times a week. CBD oil   Taking  . OVER THE COUNTER MEDICATION Take 5 mLs by mouth 2 (two) times daily. Black seed oil   Taking  . promethazine (PHENERGAN) 25 MG tablet Take  0.5-1 tablets (12.5-25 mg total) by mouth every 6 (six) hours as needed. 30 tablet 2     Review of Systems  Gastrointestinal: Positive for abdominal pain and vomiting.   Physical Exam   Blood pressure 116/67, pulse 69, temperature 98.2 F (36.8 C), temperature source Oral, resp. rate 16, weight 47.7 kg, last menstrual period 04/11/2018, SpO2 100 %.  Physical Exam  Nursing note and vitals reviewed. Constitutional: She is oriented to person, place, and time. She appears well-developed and well-nourished. No distress.  HENT:  Head: Normocephalic.  Cardiovascular: Normal rate.  Respiratory: Effort normal.  GI: Soft. There is no tenderness. There is no rebound.  Neurological: She is alert and oriented to person, place, and time.  Skin: Skin is warm and dry.  Psychiatric: She has a normal mood and affect.   Results for orders placed or performed during the hospital encounter of 07/14/18 (from the past 24 hour(s))  Urinalysis, Routine w reflex microscopic     Status: Abnormal   Collection Time: 07/14/18 10:57 AM  Result Value Ref Range   Color, Urine YELLOW YELLOW   APPearance HAZY (A) CLEAR   Specific Gravity, Urine 1.023 1.005 - 1.030   pH 6.0 5.0 - 8.0   Glucose, UA NEGATIVE NEGATIVE mg/dL   Hgb urine dipstick NEGATIVE NEGATIVE   Bilirubin Urine NEGATIVE NEGATIVE   Ketones, ur 20 (A) NEGATIVE mg/dL   Protein, ur 30 (A) NEGATIVE mg/dL   Nitrite NEGATIVE NEGATIVE   Leukocytes, UA NEGATIVE NEGATIVE   RBC / HPF 0-5 0 - 5 RBC/hpf   WBC, UA 0-5 0 - 5 WBC/hpf   Bacteria, UA RARE (A) NONE SEEN   Squamous Epithelial / LPF 0-5 0 - 5   Mucus PRESENT   CBC with Differential/Platelet     Status: Abnormal   Collection Time: 07/14/18 11:53 AM  Result Value Ref Range   WBC 8.5 4.0 - 10.5 K/uL   RBC 4.09 3.87 - 5.11 MIL/uL   Hemoglobin 11.4 (L) 12.0 - 15.0 g/dL   HCT 09.8 (L) 11.9 - 14.7 %   MCV 81.2 80.0 - 100.0 fL   MCH 27.9 26.0 - 34.0 pg   MCHC 34.3 30.0 - 36.0 g/dL   RDW 82.9  56.2 - 13.0 %   Platelets 298 150 - 400 K/uL   nRBC 0.0 0.0 - 0.2 %   Neutrophils Relative % 85 %   Neutro Abs 7.2 1.7 - 7.7 K/uL   Lymphocytes Relative 11 %  Lymphs Abs 1.0 0.7 - 4.0 K/uL   Monocytes Relative 2 %   Monocytes Absolute 0.2 0.1 - 1.0 K/uL   Eosinophils Relative 2 %   Eosinophils Absolute 0.2 0.0 - 0.5 K/uL   Basophils Relative 0 %   Basophils Absolute 0.0 0.0 - 0.1 K/uL  Comprehensive metabolic panel     Status: Abnormal   Collection Time: 07/14/18 11:53 AM  Result Value Ref Range   Sodium 134 (L) 135 - 145 mmol/L   Potassium 3.4 (L) 3.5 - 5.1 mmol/L   Chloride 102 98 - 111 mmol/L   CO2 23 22 - 32 mmol/L   Glucose, Bld 89 70 - 99 mg/dL   BUN 8 6 - 20 mg/dL   Creatinine, Ser 1.61 0.44 - 1.00 mg/dL   Calcium 8.9 8.9 - 09.6 mg/dL   Total Protein 7.0 6.5 - 8.1 g/dL   Albumin 3.8 3.5 - 5.0 g/dL   AST 15 15 - 41 U/L   ALT 13 0 - 44 U/L   Alkaline Phosphatase 35 (L) 38 - 126 U/L   Total Bilirubin 1.0 0.3 - 1.2 mg/dL   GFR calc non Af Amer >60 >60 mL/min   GFR calc Af Amer >60 >60 mL/min   Anion gap 9 5 - 15  Amylase     Status: Abnormal   Collection Time: 07/14/18 11:53 AM  Result Value Ref Range   Amylase 124 (H) 28 - 100 U/L  Lipase, blood     Status: None   Collection Time: 07/14/18 11:53 AM  Result Value Ref Range   Lipase 29 11 - 51 U/L     MAU Course  Procedures  MDM Patient has had 2L or LR.  8mg  Zofran IV 40mg  Pepcid IV Robinul IV  She reports feeling much better. No further emesis while here.  DW patient that length that CBD/THC could actually be worsening her sx, and I would recommend stopping at this time. She is agreeable to this. Discussed that trying different meds and taking them consistently is our best choice at this time, and that her sx should eventually improve as the pregnancy progresses.   Assessment and Plan   1. Nausea/vomiting in pregnancy   2. [redacted] weeks gestation of pregnancy    DC home Comfort measures reviewed  1st/2nd  Trimester precautions  RX: scopalamine patch q72, reglan TID&AC, robinul 1mg  TID, Boost high protein TID (printed RX provided for the patient for boost).  Return to MAU as needed FU with OB as planned  Follow-up Information    [facility] Follow up.   Specialty:  Obstetrics and Gynecology Why:  as scheduled  Contact information: [address] [place] [address] [phone]          Thressa Sheller 07/14/2018, 11:22 AM

## 2018-07-14 NOTE — MAU Note (Signed)
Kathy Dean is a 37 y.o. at 109w3d here in MAU reporting: +emesis. Endorses has hyperemesis. N/V has gotten worse over the last couple of days. "all blood yesterday" in emesis. Still some today. +burning and soreness in the abdominal area. Tingling in right arm that has been an ongoing problem. Pain score: 6/10 Vitals:   07/14/18 1050  BP: 116/67  Pulse: 69  Resp: 16  Temp: 98.2 F (36.8 C)  SpO2: 100%      Lab orders placed from triage: ua

## 2018-07-18 ENCOUNTER — Encounter: Payer: Medicaid Other | Admitting: Obstetrics and Gynecology

## 2018-07-18 NOTE — BH Specialist Note (Deleted)
Integrated Behavioral Health Initial Visit  MRN: [id number] Name: Kathy Dean  Number of Integrated Behavioral Health Clinician visits:: 1/6 Session Start time: ***  Session End time: *** Total time: {IBH Total Time:21014050}  Type of Service: Integrated Behavioral Health- Individual/Family Interpretor:No. Interpretor Name and Language: n/a   Warm Hand Off Completed.       SUBJECTIVE: [name] is a 37 y.o. female accompanied by {CHL AMB ACCOMPANIED NF:[id number]} Patient was referred by Leroy Libman, MD for Initial OB introduction to integrated behavioral health services  Patient reports the following symptoms/concerns: *** Duration of problem: ***; Severity of problem: {Mild/Moderate/Severe:20260}  OBJECTIVE: Mood: {BHH MOOD:22306} and Affect: {BHH AFFECT:22307} Risk of harm to self or others: {CHL AMB BH Suicide Current Mental Status:21022748}  LIFE CONTEXT: Family and Social: *** School/Work: *** Self-Care: *** Life Changes: Current pregnancy ***  GOALS ADDRESSED: Patient will: 1. Reduce symptoms of: {IBH Symptoms:21014056} 2. Increase knowledge and/or ability of: {IBH Patient Tools:21014057}  3. Demonstrate ability to: {IBH Goals:21014053}  INTERVENTIONS: Interventions utilized: {IBH Interventions:21014054}  Standardized Assessments completed: {IBH Screening Tools:21014051}  ASSESSMENT: Patient currently experiencing Supervision of *** pregnancy, ***.   Patient may benefit from Initial OB introduction to integrated behavioral health services .  PLAN: 1. Follow up with behavioral health clinician on : *** 2. Behavioral recommendations: *** 3. Referral(s): {IBH Referrals:21014055} 4. "From scale of 1-10, how likely are you to follow plan?": ***  Valetta Close Raad Clayson, LCSW  ***

## 2018-07-31 ENCOUNTER — Encounter: Payer: Medicaid Other | Admitting: Advanced Practice Midwife

## 2018-08-10 DIAGNOSIS — Z1388 Encounter for screening for disorder due to exposure to contaminants: Secondary | ICD-10-CM | POA: Diagnosis not present

## 2018-08-10 DIAGNOSIS — Z3009 Encounter for other general counseling and advice on contraception: Secondary | ICD-10-CM | POA: Diagnosis not present

## 2018-08-10 DIAGNOSIS — Z0389 Encounter for observation for other suspected diseases and conditions ruled out: Secondary | ICD-10-CM | POA: Diagnosis not present

## 2018-08-11 ENCOUNTER — Inpatient Hospital Stay (HOSPITAL_COMMUNITY)
Admission: AD | Admit: 2018-08-11 | Discharge: 2018-08-11 | Disposition: A | Discharge status: Home / Self Care | Payer: Medicaid Other | Source: Ambulatory Visit | Attending: Family Medicine | Admitting: Family Medicine

## 2018-08-11 ENCOUNTER — Inpatient Hospital Stay (HOSPITAL_BASED_OUTPATIENT_CLINIC_OR_DEPARTMENT_OTHER): Payer: Medicaid Other

## 2018-08-11 ENCOUNTER — Other Ambulatory Visit: Payer: Self-pay

## 2018-08-11 ENCOUNTER — Encounter (HOSPITAL_COMMUNITY): Payer: Self-pay

## 2018-08-11 DIAGNOSIS — O9989 Other specified diseases and conditions complicating pregnancy, childbirth and the puerperium: Secondary | ICD-10-CM | POA: Diagnosis not present

## 2018-08-11 DIAGNOSIS — O9A212 Injury, poisoning and certain other consequences of external causes complicating pregnancy, second trimester: Secondary | ICD-10-CM | POA: Diagnosis not present

## 2018-08-11 DIAGNOSIS — M549 Dorsalgia, unspecified: Secondary | ICD-10-CM | POA: Insufficient documentation

## 2018-08-11 DIAGNOSIS — Y9301 Activity, walking, marching and hiking: Secondary | ICD-10-CM | POA: Insufficient documentation

## 2018-08-11 DIAGNOSIS — W109XXA Fall (on) (from) unspecified stairs and steps, initial encounter: Secondary | ICD-10-CM | POA: Insufficient documentation

## 2018-08-11 DIAGNOSIS — R1030 Lower abdominal pain, unspecified: Secondary | ICD-10-CM | POA: Insufficient documentation

## 2018-08-11 DIAGNOSIS — O26892 Other specified pregnancy related conditions, second trimester: Secondary | ICD-10-CM | POA: Diagnosis not present

## 2018-08-11 DIAGNOSIS — Z3A17 17 weeks gestation of pregnancy: Secondary | ICD-10-CM | POA: Diagnosis not present

## 2018-08-11 DIAGNOSIS — O09212 Supervision of pregnancy with history of pre-term labor, second trimester: Secondary | ICD-10-CM | POA: Diagnosis not present

## 2018-08-11 LAB — URINALYSIS, ROUTINE W REFLEX MICROSCOPIC
BILIRUBIN URINE: NEGATIVE
GLUCOSE, UA: NEGATIVE mg/dL
Hgb urine dipstick: NEGATIVE
KETONES UR: NEGATIVE mg/dL
Leukocytes, UA: NEGATIVE
Nitrite: NEGATIVE
PH: 6 (ref 5.0–8.0)
PROTEIN: NEGATIVE mg/dL
Specific Gravity, Urine: 1.014 (ref 1.005–1.030)

## 2018-08-11 MED ORDER — CYCLOBENZAPRINE HCL 10 MG PO TABS: 10.0000 mg | ORAL_TABLET | Freq: Two times a day (BID) | ORAL | 0 refills | Status: AC | PRN

## 2018-08-11 MED ORDER — TERCONAZOLE 0.4 % VA CREA: 1.0000 | TOPICAL_CREAM | Freq: Every day | VAGINAL | 0 refills | Status: DC

## 2018-08-11 MED ORDER — OXYCODONE-ACETAMINOPHEN 5-325 MG PO TABS
1.0000 | ORAL_TABLET | Freq: Once | ORAL | Status: DC
  Filled 2018-08-11: qty 1

## 2018-08-11 NOTE — MAU Note (Signed)
Kathy Dean is a 37 y.o. at [redacted]w[redacted]d here in MAU reporting: +fall. +lower back that radiates into her bottom and abdominal pai Onset of complaint: around noon after falling down the stairs. She reports she slid down the stairs. Pain score: 5-6/10 Vitals:   08/11/18 1459  BP: 116/70  Pulse: 97  Resp: 16  Temp: 98.2 F (36.8 C)  SpO2: 98%     FHT:144 via doppler Lab orders placed from triage: ua

## 2018-08-11 NOTE — MAU Provider Note (Signed)
History     CSN: [id number]  Arrival date and time: 08/11/18 1444   First Provider Initiated Contact with Patient 08/11/18 1657      Chief Complaint  Patient presents with  . Fall  . Back Pain   Patient presents after a fall She was walking down some stairs with no shoes on, just socks and slipped  She reports that she fell down several stairs on her bottom Did not hit her belly Since that time she's had worsening abdominal pain located in the lower abdomen Pain is about 7/10 now and crampy in nature She also has soreness in her bottom  Denies hematuria, vaginal bleeding or loss of fluid     Past Medical History:  Diagnosis Date  . Abnormal Pap smear    rpt was ok  . Anemia   . Chronic hypertension   . Headache(784.0)   . History of blood transfusion   . Hyperemesis gravidarum   . Pregnancy induced hypertension   . Preterm labor     Past Surgical History:  Procedure Laterality Date  . INDUCED ABORTION    . NO PAST SURGERIES      Family History  Problem Relation Age of Onset  . Stroke Maternal Grandmother   . Cancer Maternal Grandfather   . Hypertension Mother   . Diabetes Mother   . Hypertension Father   . Other Neg Hx     Social History   Tobacco Use  . Smoking status: Never Smoker  . Smokeless tobacco: Never Used  Substance Use Topics  . Alcohol use: Not Currently    Alcohol/week: 21.0 standard drinks    Types: 21 Standard drinks or equivalent per week    Comment: not since pregnancy  . Drug use: Not Currently    Types: Marijuana    Comment: last used 1.5 weeks ago     Allergies:  Allergies  Allergen Reactions  . Flagyl [Metronidazole] Swelling and Dermatitis    Patient states can take gel form.   . Latex Swelling and Dermatitis    Medications Prior to Admission  Medication Sig Dispense Refill Last Dose  . famotidine-calcium carbonate-magnesium hydroxide (PEPCID COMPLETE) 10-800-165 MG chewable tablet Chew 1 tablet by mouth 2 (two)  times daily as needed. 60 tablet 2 Past Month at Unknown time  . feeding supplement (BOOST HIGH PROTEIN) LIQD Take 237 mLs by mouth 3 (three) times daily between meals. 30 Can 6   . glycopyrrolate (ROBINUL) 1 MG tablet Take 1 tablet (1 mg total) by mouth 3 (three) times daily. 60 tablet 3   . metoCLOPramide (REGLAN) 10 MG tablet Take 1 tablet (10 mg total) by mouth 4 (four) times daily -  before meals and at bedtime. 60 tablet 3   . NON FORMULARY Take 5 drops by mouth 2 (two) times a week. CBD oil   Taking  . Nutritional Supplements (BOOST 100 CALORIE SMART) LIQD Take 8 oz by mouth 2 (two) times daily. 56 Bottle 0 Past Week at Unknown time  . ondansetron (ZOFRAN ODT) 8 MG disintegrating tablet Take 1 tablet (8 mg total) by mouth every 8 (eight) hours as needed for nausea or vomiting. 20 tablet 0 07/13/2018 at Unknown time  . OVER THE COUNTER MEDICATION Take 5 mLs by mouth 2 (two) times daily. Black seed oil   Taking  . promethazine (PHENERGAN) 25 MG tablet Take 0.5-1 tablets (12.5-25 mg total) by mouth every 6 (six) hours as needed. 30 tablet 2   . ranitidine (  ZANTAC) 150 MG tablet Take 1 tablet (150 mg total) by mouth 2 (two) times daily. 60 tablet 0 Past Week at Unknown time  . scopolamine (TRANSDERM-SCOP, 1.5 MG,) 1 MG/3DAYS Place 1 patch (1.5 mg total) onto the skin every 3 (three) days. 10 patch 12   . terconazole (TERAZOL 7) 0.4 % vaginal cream Place 1 applicator vaginally at bedtime. 45 g 0 Past Month at Unknown time    Review of Systems  All other systems reviewed and are negative.  Physical Exam   Blood pressure 116/70, pulse 97, temperature 98.2 F (36.8 C), temperature source Oral, resp. rate 16, weight 49.9 kg, last menstrual period 04/11/2018, SpO2 98 %.  Physical Exam  Constitutional: She is oriented to person, place, and time. She appears well-developed and well-nourished. No distress.  HENT:  Head: Normocephalic and atraumatic.  Eyes: Pupils are equal, round, and reactive to  light. Right eye exhibits no discharge. Left eye exhibits no discharge. No scleral icterus.  Neck: Normal range of motion.  Cardiovascular: Normal rate, regular rhythm and normal heart sounds. Exam reveals no gallop and no friction rub.  No murmur heard. Respiratory: Effort normal and breath sounds normal. No respiratory distress. She has no wheezes. She has no rales.  GI: Soft. Bowel sounds are normal. There is tenderness (mild tenderness to palpation of lower abdomen ). There is no rebound and no guarding.  Musculoskeletal: Normal range of motion. She exhibits no edema or tenderness.  Neurological: She is alert and oriented to person, place, and time. No cranial nerve deficit.  Skin: Skin is warm and dry. She is not diaphoretic. No erythema.  Psychiatric: She has a normal mood and affect. Her behavior is normal. Judgment and thought content normal.    MAU Course  Procedures  MDM Given pain and significant fall, sent for OB limited US  Assessment and Plan  Traumatic injury during pregnancy in second trimester - Plan: Discharge patient - will treat pain with percocet x 1 here and send home with flexeril - return precautions - follow up at next scheduled visit or PRN for any problems previsit     Kathy Dean 08/11/2018, 6:41 PM

## 2018-08-22 ENCOUNTER — Telehealth: Payer: Self-pay | Admitting: Family Medicine

## 2018-08-22 NOTE — Telephone Encounter (Signed)
LVM with tomorrow's appt ( 11/21) @ 2:55. Pt left a message with the answering service for appt confirmaton.

## 2018-08-23 ENCOUNTER — Encounter: Payer: Self-pay | Admitting: Obstetrics and Gynecology

## 2018-08-23 ENCOUNTER — Telehealth: Payer: Self-pay

## 2018-08-23 ENCOUNTER — Ambulatory Visit (INDEPENDENT_AMBULATORY_CARE_PROVIDER_SITE_OTHER): Payer: Medicaid Other | Admitting: Obstetrics and Gynecology

## 2018-08-23 VITALS — BP 116/61 | HR 80 | Wt 111.0 lb

## 2018-08-23 DIAGNOSIS — B9689 Other specified bacterial agents as the cause of diseases classified elsewhere: Secondary | ICD-10-CM

## 2018-08-23 DIAGNOSIS — N760 Acute vaginitis: Secondary | ICD-10-CM

## 2018-08-23 DIAGNOSIS — O10912 Unspecified pre-existing hypertension complicating pregnancy, second trimester: Secondary | ICD-10-CM

## 2018-08-23 DIAGNOSIS — Z8751 Personal history of pre-term labor: Secondary | ICD-10-CM

## 2018-08-23 DIAGNOSIS — O09522 Supervision of elderly multigravida, second trimester: Secondary | ICD-10-CM

## 2018-08-23 DIAGNOSIS — R51 Headache: Secondary | ICD-10-CM | POA: Diagnosis not present

## 2018-08-23 DIAGNOSIS — O0932 Supervision of pregnancy with insufficient antenatal care, second trimester: Secondary | ICD-10-CM

## 2018-08-23 DIAGNOSIS — O10919 Unspecified pre-existing hypertension complicating pregnancy, unspecified trimester: Secondary | ICD-10-CM | POA: Insufficient documentation

## 2018-08-23 DIAGNOSIS — R519 Headache, unspecified: Secondary | ICD-10-CM | POA: Insufficient documentation

## 2018-08-23 DIAGNOSIS — O0990 Supervision of high risk pregnancy, unspecified, unspecified trimester: Secondary | ICD-10-CM

## 2018-08-23 DIAGNOSIS — O09529 Supervision of elderly multigravida, unspecified trimester: Secondary | ICD-10-CM | POA: Insufficient documentation

## 2018-08-23 DIAGNOSIS — Z3482 Encounter for supervision of other normal pregnancy, second trimester: Secondary | ICD-10-CM | POA: Diagnosis not present

## 2018-08-23 DIAGNOSIS — O0992 Supervision of high risk pregnancy, unspecified, second trimester: Secondary | ICD-10-CM

## 2018-08-23 NOTE — Telephone Encounter (Signed)
Called pt to advised of appointment for 12/5 @ 1:45pm for anatomy scan and to advise of appointment on 12/20 @11 :15am for HROB Appointment with Dr.Dove. Phone kept ringing for 3 min then disconnected. If pt calls back please advise of appointment information. I Strongly advise someone to call pt back to let her know of appointments scheduled as she left before appointments could be scheduled.

## 2018-08-23 NOTE — Progress Notes (Signed)
Subjective:   Kathy Dean is a 37 y.o. [id number] at [redacted]w[redacted]d by LMP being seen today for her first obstetrical visit.  Her obstetrical history is significant for advanced maternal age, chronic hypertension not on medication, preterm delivery X 3, late to prenatal care. Patient does intend to breast feed. Pregnancy history fully reviewed.   Patient reports no complaints.  HISTORY: OB History  Gravida Para Term Preterm AB Living  8 4 1 3 3 4   SAB TAB Ectopic Multiple Live Births  1 2 0 0 1    # Outcome Date GA Lbr Len/2nd Weight Sex Delivery Anes PTL Lv  8 Current           7 Term 04/24/14 [redacted]w[redacted]d 17:30 / 00:40 7 lb 4.8 oz (3.31 kg) M Vag-Spont EPI  LIV     Name: Boggan,BOY Adajah     Apgar1: 9  Apgar5: 9  6 SAB           5 TAB           4 TAB           3 Preterm  [redacted]w[redacted]d    Vag-Spont     2 Preterm  [redacted]w[redacted]d    Vag-Spont     1 Preterm  [redacted]w[redacted]d    Vag-Spont       Last pap smear was done unknown and was normal  Past Medical History:  Diagnosis Date  . Abnormal Pap smear    rpt was ok  . Anemia   . Chronic hypertension   . Headache(784.0)   . History of blood transfusion   . Hyperemesis gravidarum   . Pregnancy induced hypertension   . Preterm labor    Past Surgical History:  Procedure Laterality Date  . INDUCED ABORTION    . NO PAST SURGERIES     Family History  Problem Relation Age of Onset  . Stroke Maternal Grandmother   . Cancer Maternal Grandfather   . Hypertension Mother   . Diabetes Mother   . Hypertension Father   . Other Neg Hx    Social History   Tobacco Use  . Smoking status: Never Smoker  . Smokeless tobacco: Never Used  Substance Use Topics  . Alcohol use: Not Currently    Alcohol/week: 21.0 standard drinks    Types: 21 Standard drinks or equivalent per week    Comment: not since pregnancy  . Drug use: Not Currently    Types: Marijuana    Comment: last used 1.5 weeks ago    Allergies  Allergen Reactions  . Flagyl [Metronidazole] Swelling and  Dermatitis    Patient states can take gel form.   . Latex Swelling and Dermatitis   Current Outpatient Medications on File Prior to Visit  Medication Sig Dispense Refill  . cyclobenzaprine (FLEXERIL) 10 MG tablet Take 1 tablet (10 mg total) by mouth 2 (two) times daily as needed for up to 20 days for muscle spasms. 20 tablet 0  . famotidine-calcium carbonate-magnesium hydroxide (PEPCID COMPLETE) 10-800-165 MG chewable tablet Chew 1 tablet by mouth 2 (two) times daily as needed. 60 tablet 2  . feeding supplement (BOOST HIGH PROTEIN) LIQD Take 237 mLs by mouth 3 (three) times daily between meals. 30 Can 6  . glycopyrrolate (ROBINUL) 1 MG tablet Take 1 tablet (1 mg total) by mouth 3 (three) times daily. 60 tablet 3  . metoCLOPramide (REGLAN) 10 MG tablet Take 1 tablet (10 mg total) by mouth 4 (four)  times daily -  before meals and at bedtime. 60 tablet 3  . NON FORMULARY Take 5 drops by mouth 2 (two) times a week. CBD oil    . Nutritional Supplements (BOOST 100 CALORIE SMART) LIQD Take 8 oz by mouth 2 (two) times daily. 56 Bottle 0  . ondansetron (ZOFRAN ODT) 8 MG disintegrating tablet Take 1 tablet (8 mg total) by mouth every 8 (eight) hours as needed for nausea or vomiting. 20 tablet 0  . OVER THE COUNTER MEDICATION Take 5 mLs by mouth 2 (two) times daily. Black seed oil    . promethazine (PHENERGAN) 25 MG tablet Take 0.5-1 tablets (12.5-25 mg total) by mouth every 6 (six) hours as needed. 30 tablet 2  . ranitidine (ZANTAC) 150 MG tablet Take 1 tablet (150 mg total) by mouth 2 (two) times daily. 60 tablet 0  . scopolamine (TRANSDERM-SCOP, 1.5 MG,) 1 MG/3DAYS Place 1 patch (1.5 mg total) onto the skin every 3 (three) days. 10 patch 12  . terconazole (TERAZOL 7) 0.4 % vaginal cream Place 1 applicator vaginally at bedtime. 45 g 0   No current facility-administered medications on file prior to visit.     Review of Systems Pertinent items noted in HPI and remainder of comprehensive ROS otherwise  negative.  Exam   Vitals:   08/23/18 1512  BP: 116/61  Pulse: 80  Weight: 111 lb (50.3 kg)   Fetal Heart Rate (bpm): 152  System: General: well-developed, well-nourished female in no acute distress   Skin: normal coloration and turgor, no rashes   Neurologic: oriented, normal, negative, normal mood   Extremities: normal strength, tone, and muscle mass, ROM of all joints is normal   HEENT PERRLA, extraocular movement intact and sclera clear, anicteric   Mouth/Teeth mucous membranes moist, pharynx normal without lesions and dental hygiene good   Neck supple and no masses   Cardiovascular: regular rate and rhythm   Respiratory:  no respiratory distress, normal breath sounds   Abdomen: soft, non-tender; bowel sounds normal; no masses,  no organomegaly    Assessment:   Pregnancy: [id number] Patient Active Problem List   Diagnosis Date Noted  . Supervision of high risk pregnancy, antepartum 08/23/2018  . History of preterm delivery 08/23/2018  . Late prenatal care in second trimester 08/23/2018  . Chronic hypertension affecting pregnancy 08/23/2018  . Headache 08/23/2018  . Advanced maternal age in multigravida 08/23/2018  . Bacterial vaginitis 12/19/2017     Plan:   1. Supervision of high risk pregnancy, antepartum  - Desires NIPS only, declines all other genetic testing.  - Declines all genetic testing other than panorama.   2. History of preterm delivery  3 preterm delivery's, 1 term with last pregnancy  Refuses 17P, states she has not used this with any of her pregnancies She is agreeable to serial cervical lengths if needed.  [place] with transvaginal next week.   3. Late prenatal care in second trimester  Several MAU visits prior to office visit.   4. Chronic hypertension affecting pregnancy  Treating BP on her own with [place] medication.; says she uses CBD oil daily.  Declines BASA   Initial labs drawn. Continue prenatal vitamins. Genetic Screening discussed,  NIPS: requested. Ultrasound discussed; fetal anatomic survey: ordered. Problem list reviewed and updated. The nature of [facility] with multiple MDs and other Advanced Practice Providers was explained to patient; also emphasized that residents, students are part of our team. Routine obstetric precautions reviewed. Return  in about 4 weeks (around 09/20/2018) for High Risk only, MD only please.   Anel Purohit, Harolyn RutherfordJennifer I, NP [facility]

## 2018-08-23 NOTE — Telephone Encounter (Signed)
I called Umeka and informed her of her US appointment and next ob fu appointment. She voice understanding.

## 2018-08-24 LAB — OBSTETRIC PANEL, INCLUDING HIV
ANTIBODY SCREEN: NEGATIVE
BASOS: 0 %
Basophils Absolute: 0 10*3/uL (ref 0.0–0.2)
EOS (ABSOLUTE): 0.1 10*3/uL (ref 0.0–0.4)
Eos: 1 %
HEMATOCRIT: 27.1 % — AB (ref 34.0–46.6)
HIV SCREEN 4TH GENERATION: NONREACTIVE
Hemoglobin: 9.3 g/dL — ABNORMAL LOW (ref 11.1–15.9)
Hepatitis B Surface Ag: NEGATIVE
Immature Grans (Abs): 0 10*3/uL (ref 0.0–0.1)
Immature Granulocytes: 0 %
Lymphocytes Absolute: 1.3 10*3/uL (ref 0.7–3.1)
Lymphs: 13 %
MCH: 29.6 pg (ref 26.6–33.0)
MCHC: 34.3 g/dL (ref 31.5–35.7)
MCV: 86 fL (ref 79–97)
MONOCYTES: 5 %
MONOS ABS: 0.5 10*3/uL (ref 0.1–0.9)
NEUTROS ABS: 8.1 10*3/uL — AB (ref 1.4–7.0)
Neutrophils: 81 %
Platelets: 301 10*3/uL (ref 150–450)
RBC: 3.14 x10E6/uL — ABNORMAL LOW (ref 3.77–5.28)
RDW: 14.1 % (ref 12.3–15.4)
RH TYPE: POSITIVE
RPR Ser Ql: NONREACTIVE
RUBELLA: 7.9 {index} (ref >0.99)
WBC: 10.1 10*3/uL (ref 3.4–10.8)

## 2018-08-24 LAB — PROTEIN / CREATININE RATIO, URINE
CREATININE, UR: 49.9 mg/dL
Protein, Ur: 4.7 mg/dL
Protein/Creat Ratio: 94 mg/g creat (ref 0–200)

## 2018-08-24 LAB — RPR+RH+ABO+RUB AB+AB SCR+CB...: Varicella zoster IgG: 3302 index (ref >165)

## 2018-08-27 ENCOUNTER — Encounter: Payer: Self-pay | Admitting: Obstetrics and Gynecology

## 2018-08-27 DIAGNOSIS — O99019 Anemia complicating pregnancy, unspecified trimester: Secondary | ICD-10-CM | POA: Insufficient documentation

## 2018-08-27 LAB — CULTURE, OB URINE

## 2018-08-27 LAB — URINE CULTURE, OB REFLEX

## 2018-08-29 ENCOUNTER — Encounter (HOSPITAL_COMMUNITY): Payer: Self-pay

## 2018-09-04 ENCOUNTER — Encounter: Payer: Self-pay | Admitting: *Deleted

## 2018-09-06 ENCOUNTER — Ambulatory Visit (HOSPITAL_COMMUNITY)
Admission: RE | Admit: 2018-09-06 | Discharge: 2018-09-06 | Disposition: A | Discharge status: Home / Self Care | Payer: Medicaid Other | Source: Ambulatory Visit | Attending: Obstetrics and Gynecology | Admitting: Obstetrics and Gynecology

## 2018-09-06 ENCOUNTER — Other Ambulatory Visit: Payer: Self-pay | Admitting: Obstetrics and Gynecology

## 2018-09-06 ENCOUNTER — Telehealth: Payer: Self-pay | Admitting: Obstetrics & Gynecology

## 2018-09-06 ENCOUNTER — Encounter (HOSPITAL_COMMUNITY): Payer: Self-pay

## 2018-09-06 DIAGNOSIS — O0990 Supervision of high risk pregnancy, unspecified, unspecified trimester: Secondary | ICD-10-CM

## 2018-09-06 DIAGNOSIS — Z363 Encounter for antenatal screening for malformations: Secondary | ICD-10-CM | POA: Insufficient documentation

## 2018-09-06 DIAGNOSIS — R519 Headache, unspecified: Secondary | ICD-10-CM

## 2018-09-06 DIAGNOSIS — O3432 Maternal care for cervical incompetence, second trimester: Secondary | ICD-10-CM | POA: Diagnosis not present

## 2018-09-06 DIAGNOSIS — O0932 Supervision of pregnancy with insufficient antenatal care, second trimester: Secondary | ICD-10-CM

## 2018-09-06 DIAGNOSIS — Z8751 Personal history of pre-term labor: Secondary | ICD-10-CM

## 2018-09-06 DIAGNOSIS — Z3A32 32 weeks gestation of pregnancy: Secondary | ICD-10-CM | POA: Insufficient documentation

## 2018-09-06 DIAGNOSIS — O09212 Supervision of pregnancy with history of pre-term labor, second trimester: Secondary | ICD-10-CM

## 2018-09-06 DIAGNOSIS — O09522 Supervision of elderly multigravida, second trimester: Secondary | ICD-10-CM | POA: Diagnosis not present

## 2018-09-06 DIAGNOSIS — R51 Headache: Secondary | ICD-10-CM

## 2018-09-06 DIAGNOSIS — O10919 Unspecified pre-existing hypertension complicating pregnancy, unspecified trimester: Secondary | ICD-10-CM

## 2018-09-06 DIAGNOSIS — Z3686 Encounter for antenatal screening for cervical length: Secondary | ICD-10-CM | POA: Diagnosis not present

## 2018-09-06 DIAGNOSIS — Z3A21 21 weeks gestation of pregnancy: Secondary | ICD-10-CM | POA: Insufficient documentation

## 2018-09-06 NOTE — Telephone Encounter (Signed)
Called patient about appointment. Left a VM for her to call us.

## 2018-09-06 NOTE — Telephone Encounter (Signed)
-----   Message from Willodean Rosenthalarolyn Harraway-Smith, MD sent at 09/06/2018  3:39 PM EST ----- Please call pt. She needs to be seen ASAP with a MD provider to discuss her plan of care.  Preterm labor. Shortened cervix.  Needs within the next 4 days.  Thx, clh-S

## 2018-09-07 ENCOUNTER — Encounter: Payer: Self-pay | Admitting: Obstetrics and Gynecology

## 2018-09-07 DIAGNOSIS — Z91199 Patient's noncompliance with other medical treatment and regimen due to unspecified reason: Secondary | ICD-10-CM | POA: Insufficient documentation

## 2018-09-07 DIAGNOSIS — O26879 Cervical shortening, unspecified trimester: Secondary | ICD-10-CM | POA: Insufficient documentation

## 2018-09-07 DIAGNOSIS — Z9119 Patient's noncompliance with other medical treatment and regimen: Secondary | ICD-10-CM | POA: Insufficient documentation

## 2018-09-10 ENCOUNTER — Ambulatory Visit (INDEPENDENT_AMBULATORY_CARE_PROVIDER_SITE_OTHER): Payer: Medicaid Other | Admitting: Obstetrics and Gynecology

## 2018-09-10 ENCOUNTER — Encounter: Payer: Self-pay | Admitting: Obstetrics and Gynecology

## 2018-09-10 ENCOUNTER — Encounter (HOSPITAL_COMMUNITY): Payer: Self-pay | Admitting: *Deleted

## 2018-09-10 ENCOUNTER — Other Ambulatory Visit: Payer: Self-pay

## 2018-09-10 ENCOUNTER — Inpatient Hospital Stay (HOSPITAL_COMMUNITY)
Admission: AD | Admit: 2018-09-10 | Discharge: 2018-09-10 | Disposition: A | Discharge status: Home / Self Care | Payer: Medicaid Other | Attending: Obstetrics & Gynecology | Admitting: Obstetrics & Gynecology

## 2018-09-10 VITALS — BP 130/86 | HR 89 | Wt 112.3 lb

## 2018-09-10 DIAGNOSIS — O26892 Other specified pregnancy related conditions, second trimester: Secondary | ICD-10-CM | POA: Insufficient documentation

## 2018-09-10 DIAGNOSIS — O10912 Unspecified pre-existing hypertension complicating pregnancy, second trimester: Secondary | ICD-10-CM

## 2018-09-10 DIAGNOSIS — N898 Other specified noninflammatory disorders of vagina: Secondary | ICD-10-CM | POA: Insufficient documentation

## 2018-09-10 DIAGNOSIS — Z0371 Encounter for suspected problem with amniotic cavity and membrane ruled out: Secondary | ICD-10-CM

## 2018-09-10 DIAGNOSIS — Z8751 Personal history of pre-term labor: Secondary | ICD-10-CM

## 2018-09-10 DIAGNOSIS — Z3A21 21 weeks gestation of pregnancy: Secondary | ICD-10-CM | POA: Insufficient documentation

## 2018-09-10 DIAGNOSIS — O09522 Supervision of elderly multigravida, second trimester: Secondary | ICD-10-CM

## 2018-09-10 DIAGNOSIS — Z3009 Encounter for other general counseling and advice on contraception: Secondary | ICD-10-CM | POA: Insufficient documentation

## 2018-09-10 DIAGNOSIS — O0990 Supervision of high risk pregnancy, unspecified, unspecified trimester: Secondary | ICD-10-CM

## 2018-09-10 DIAGNOSIS — O26879 Cervical shortening, unspecified trimester: Secondary | ICD-10-CM

## 2018-09-10 DIAGNOSIS — O0992 Supervision of high risk pregnancy, unspecified, second trimester: Secondary | ICD-10-CM

## 2018-09-10 DIAGNOSIS — O10919 Unspecified pre-existing hypertension complicating pregnancy, unspecified trimester: Secondary | ICD-10-CM

## 2018-09-10 LAB — URINALYSIS, ROUTINE W REFLEX MICROSCOPIC
Bilirubin Urine: NEGATIVE
Glucose, UA: NEGATIVE mg/dL
Hgb urine dipstick: NEGATIVE
Ketones, ur: NEGATIVE mg/dL
Leukocytes, UA: NEGATIVE
Nitrite: NEGATIVE
Protein, ur: NEGATIVE mg/dL
SPECIFIC GRAVITY, URINE: 1.024 (ref 1.005–1.030)
pH: 6 (ref 5.0–8.0)

## 2018-09-10 LAB — AMNISURE RUPTURE OF MEMBRANE (ROM) NOT AT ARMC: Amnisure ROM: NEGATIVE

## 2018-09-10 LAB — WET PREP, GENITAL
CLUE CELLS WET PREP: NONE SEEN
Sperm: NONE SEEN
Trich, Wet Prep: NONE SEEN
WBC, Wet Prep HPF POC: NONE SEEN
Yeast Wet Prep HPF POC: NONE SEEN

## 2018-09-10 NOTE — MAU Note (Signed)
Pt sent up from clinic to rule out rupture. Irregular contractions. States she stays moist and has to wear a panty liner at all the time.

## 2018-09-10 NOTE — Progress Notes (Addendum)
   PRENATAL VISIT NOTE  Subjective:  Kathy Dean is a 37 y.o. [id number] at 8843w5d being seen today for ongoing prenatal care.  She is currently monitored for the following issues for this high-risk pregnancy and has Bacterial vaginitis; Supervision of high risk pregnancy, antepartum; History of preterm delivery; Late prenatal care in second trimester; Chronic hypertension affecting pregnancy; Headache; Advanced maternal age in multigravida; Anemia in pregnancy; Non-compliance; and Short cervix affecting pregnancy on their problem list.  Patient reports occasional contractions, states they were worse a few days ago, improved with hydration. Some mucous, watery discharge, unsure if she broke her water. Thinks she may still have BV infection.  Contractions: Irritability. Vag. Bleeding: None.  Movement: Present. Denies leaking of fluid.   The following portions of the patient's history were reviewed and updated as appropriate: allergies, current medications, past family history, past medical history, past social history, past surgical history and problem list. Problem list updated.  Objective:   Vitals:   09/10/18 0908  BP: 130/86  Pulse: 89  Weight: 112 lb 4.8 oz (50.9 kg)    Fetal Status: Fetal Heart Rate (bpm): 151   Movement: Present     General:  Alert, oriented and cooperative. Patient is in no acute distress.  Skin: Skin is warm and dry. No rash noted.   Cardiovascular: Normal heart rate noted  Respiratory: Normal respiratory effort, no problems with respiration noted  Abdomen: Soft, gravid, appropriate for gestational age.  Pain/Pressure: Present     Pelvic: Cervical exam deferred        Extremities: Normal range of motion.  Edema: None  Mental Status: Normal mood and affect. Normal behavior. Normal judgment and thought content.   Assessment and Plan:  Pregnancy: [id number] at 943w5d  1. Supervision of high risk pregnancy, antepartum  2. History of preterm delivery Has refused  interventions in past  3. Chronic hypertension affecting pregnancy BP stable no meds Previously declined baby ASA  4. Short cervix affecting pregnancy Reviewed US findings of shortened cervix to 1.5 cm Reviewed recommendation for cerclage and 17P, reviewed risks/benefits of each, patient declines cerclage. Reviewed risks of pre-term delivery to fetus, she verbalizes understanding of the above. She will consider 17P but would like to read about it first, gave manufacturers info, she will call to make f/u appt  5. Multigravida of advanced maternal age in second trimester  6. Watery discharge To MAU for eval  7. Unwanted fertility Signed BTL papers 09/10/18, not sure but wants to think about it and make sure she will be able to have it done if she desires  Preterm labor symptoms and general obstetric precautions including but not limited to vaginal bleeding, contractions, leaking of fluid and fetal movement were reviewed in detail with the patient. Please refer to After Visit Summary for other counseling recommendations.  Return in about 2 weeks (around 09/24/2018) for OB visit (MD).  Future Appointments  Date Time Provider Department Center  09/21/2018 11:15 AM Allie Bossierove, Myra C, MD [facility]    Conan BowensKelly M Davis, MD

## 2018-09-10 NOTE — Discharge Instructions (Signed)
Pelvic Rest °Pelvic rest may be recommended if: °· Your placenta is partially or completely covering the opening of your cervix (placenta previa). °· There is bleeding between the wall of the uterus and the amniotic sac in the first trimester of pregnancy (subchorionic hemorrhage). °· You went into labor too early (preterm labor). ° °Based on your overall health and the health of your baby, your health care provider will decide if pelvic rest is right for you. °How do I rest my pelvis? °For as long as told by your health care provider: °· Do not have sex, sexual stimulation, or an orgasm. °· Do not use tampons. Do not douche. Do not put anything in your vagina. °· Do not lift anything that is heavier than 10 lb (4.5 kg). °· Avoid activities that take a lot of effort (are strenuous). °· Avoid any activity in which your pelvic muscles could become strained. ° °When should I seek medical care? °Seek medical care if you have: °· Cramping pain in your lower abdomen. °· Vaginal discharge. °· A low, dull backache. °· Regular contractions. °· Uterine tightening. ° °When should I seek immediate medical care? °Seek immediate medical care if: °· You have vaginal bleeding and you are pregnant. ° °This information is not intended to replace advice given to you by your health care provider. Make sure you discuss any questions you have with your health care provider. °Document Released: 01/14/2011 Document Revised: 02/25/2016 Document Reviewed: 03/23/2015 °Elsevier Interactive Patient Education © 2018 [employer] ° °

## 2018-09-10 NOTE — MAU Provider Note (Signed)
History     CSN: [id number]  Arrival date and time: 09/10/18 1017   First Provider Initiated Contact with Patient 09/10/18 1053      Chief Complaint  Patient presents with  . Rupture of Membranes   HPI   [name] is a 37 y.o. female [phone] @ [redacted]w[redacted]d with a history of 3 preterm deliveries here with vaginal discharge that she noticed over the weekend. She initially saw mucoid discharge and then watery discharge. She has been wearing a pad do to the leaking of fluid and discharge that she has had.  She was seen in the office today and sent here for [facility].   OB History    Gravida  8   Para  4   Term  1   Preterm  3   AB  3   Living  4     SAB  1   TAB  2   Ectopic      Multiple      Live Births  1           Past Medical History:  Diagnosis Date  . Abnormal Pap smear    rpt was ok  . Anemia   . Chronic hypertension   . Headache(784.0)   . History of blood transfusion   . Hyperemesis gravidarum   . Pregnancy induced hypertension   . Preterm labor     Past Surgical History:  Procedure Laterality Date  . INDUCED ABORTION    . NO PAST SURGERIES      Family History  Problem Relation Age of Onset  . Stroke Maternal Grandmother   . Cancer Maternal Grandfather   . Hypertension Mother   . Diabetes Mother   . Hypertension Father   . Other Neg Hx     Social History   Tobacco Use  . Smoking status: Never Smoker  . Smokeless tobacco: Never Used  Substance Use Topics  . Alcohol use: Not Currently    Alcohol/week: 21.0 standard drinks    Types: 21 Standard drinks or equivalent per week    Comment: not since pregnancy  . Drug use: Not Currently    Types: Marijuana    Comment: last used 1.5 weeks ago     Allergies:  Allergies  Allergen Reactions  . Flagyl [Metronidazole] Swelling and Dermatitis    Patient states can take gel form.   . Latex Swelling and Dermatitis    Medications Prior to Admission  Medication Sig Dispense Refill  Last Dose  . famotidine-calcium carbonate-magnesium hydroxide (PEPCID COMPLETE) [phone] MG chewable tablet Chew 1 tablet by mouth 2 (two) times daily as needed. 60 tablet 2 Taking  . feeding supplement (BOOST HIGH PROTEIN) LIQD Take 237 mLs by mouth 3 (three) times daily between meals. 30 Can 6 Taking  . glycopyrrolate (ROBINUL) 1 MG tablet Take 1 tablet (1 mg total) by mouth 3 (three) times daily. (Patient not taking: Reported on 09/06/2018) 60 tablet 3 Not Taking  . metoCLOPramide (REGLAN) 10 MG tablet Take 1 tablet (10 mg total) by mouth 4 (four) times daily -  before meals and at bedtime. (Patient not taking: Reported on 09/06/2018) 60 tablet 3 Not Taking  . NON FORMULARY Take 5 drops by mouth 2 (two) times a week. CBD oil   Taking  . ondansetron (ZOFRAN ODT) 8 MG disintegrating tablet Take 1 tablet (8 mg total) by mouth every 8 (eight) hours as needed for nausea or vomiting. (Patient not taking: Reported on 09/06/2018)  20 tablet 0 Not Taking  . OVER THE COUNTER MEDICATION Take 5 mLs by mouth 2 (two) times daily. Black seed oil   Taking  . promethazine (PHENERGAN) 25 MG tablet Take 0.5-1 tablets (12.5-25 mg total) by mouth every 6 (six) hours as needed. (Patient not taking: Reported on 09/06/2018) 30 tablet 2 Not Taking  . ranitidine (ZANTAC) 150 MG tablet Take 1 tablet (150 mg total) by mouth 2 (two) times daily. (Patient not taking: Reported on 09/06/2018) 60 tablet 0 Not Taking  . scopolamine (TRANSDERM-SCOP, 1.5 MG,) 1 MG/3DAYS Place 1 patch (1.5 mg total) onto the skin every 3 (three) days. (Patient not taking: Reported on 09/06/2018) 10 patch 12 Not Taking  . terconazole (TERAZOL 7) 0.4 % vaginal cream Place 1 applicator vaginally at bedtime. 45 g 0 Taking   Results for orders placed or performed during the hospital encounter of 09/10/18 (from the past 48 hour(s))  Urinalysis, Routine w reflex microscopic     Status: Abnormal   Collection Time: 09/10/18 10:36 AM  Result Value Ref Range    Color, Urine YELLOW YELLOW   APPearance HAZY (A) CLEAR   Specific Gravity, Urine 1.024 1.005 - 1.030   pH 6.0 5.0 - 8.0   Glucose, UA NEGATIVE NEGATIVE mg/dL   Hgb urine dipstick NEGATIVE NEGATIVE   Bilirubin Urine NEGATIVE NEGATIVE   Ketones, ur NEGATIVE NEGATIVE mg/dL   Protein, ur NEGATIVE NEGATIVE mg/dL   Nitrite NEGATIVE NEGATIVE   Leukocytes, UA NEGATIVE NEGATIVE    Comment: Performed at [facility], [address] [place], [place] [address]  Wet prep, genital     Status: None   Collection Time: 09/10/18 11:04 AM  Result Value Ref Range   Yeast Wet Prep HPF POC NONE SEEN NONE SEEN    Comment: Specimen diluted due to transport tube containing more than 1 ml of saline, interpret results with caution.   Trich, Wet Prep NONE SEEN NONE SEEN   Clue Cells Wet Prep HPF POC NONE SEEN NONE SEEN   WBC, Wet Prep HPF POC NONE SEEN NONE SEEN    Comment: MODERATE BACTERIA SEEN   Sperm NONE SEEN     Comment: Performed at [facility], [address] [place], [place] [address]  Amnisure rupture of membrane (rom)not at [facility]     Status: None   Collection Time: 09/10/18 11:04 AM  Result Value Ref Range   Amnisure ROM NEGATIVE     Comment: Performed at [facility], [address] [place], [place] [address]   Review of Systems  Gastrointestinal: Negative for abdominal pain.  Genitourinary: Positive for vaginal discharge. Negative for vaginal bleeding.   Physical Exam   Blood pressure 106/88, pulse 80, temperature 98.2 F (36.8 C), last menstrual period 04/11/2018, SpO2 100 %.  Physical Exam  Constitutional: She is oriented to person, place, and time. She appears well-developed and well-nourished.  GI: Soft. She exhibits no distension. There is no tenderness. There is no rebound and no guarding.  Genitourinary:  Genitourinary Comments: Vagina - Small amount of white vaginal discharge, no odor. No pooling of fluid  Cervix - No contact bleeding, no active bleeding  Bimanual  exam: deferred   GC/Chlam, wet prep done Chaperone present for exam.   Musculoskeletal: Normal range of motion.  Neurological: She is alert and oriented to person, place, and time.  Skin: Skin is warm. She is not diaphoretic.  Psychiatric: Her behavior is normal.   MAU Course  Procedures  MDM  Amnisure: Negative  Wet prep and GC collected.   Assessment and Plan   A:  1. Vaginal discharge in pregnancy in second trimester   2. Encounter for suspected premature rupture of membranes, with rupture of membranes not found     P:  Discharge home in with strict return precautions High risk for preterm delivery Keep OB appointments as scheduled  Aniko Finnigan, Harolyn Rutherford, NP 09/11/2018 2:58 PM

## 2018-09-10 NOTE — Patient Instructions (Signed)
Progesterone capsules What is this medicine? PROGESTERONE (proe JES ter one) is a female hormone. This medicine is used to prevent the overgrowth of the lining of the uterus in women who are taking estrogens for the symptoms of menopause. It is also used to treat secondary amenorrhea. This is when a woman stops getting menstrual periods due to low levels of progesterone. This medicine may be used for other purposes; ask your health care provider or pharmacist if you have questions. COMMON BRAND NAME(S): Prometrium What should I tell my health care provider before I take this medicine? They need to know if you have any of these conditions: -autoimmune disease like systemic lupus erythematosus (SLE) -blood vessel disease, blood clotting disorder, or suffered a stroke -breast, cervical or vaginal cancer -dementia -diabetes -kidney or liver disease -heart disease, high blood pressure or recent heart attack -high blood lipids or cholesterol -hysterectomy -recent miscarriage -tobacco smoker -vaginal bleeding -an unusual or allergic reaction to progesterone, peanuts, other medicines, foods, dyes, or preservatives -pregnant or trying to get pregnant -breast-feeding How should I use this medicine? Take this medicine by mouth with a glass of water. Follow the directions on the prescription label. Take your doses at regular intervals. Do not take your medicine more often than directed. Talk to your pediatrician regarding the use of this medicine in children. Special care may be needed. A patient package insert for the product will be given with each prescription and refill. Read this sheet carefully each time. The sheet may change frequently. Overdosage: If you think you have taken too much of this medicine contact a poison control center or emergency room at once. NOTE: This medicine is only for you. Do not share this medicine with others. What if I miss a dose? If you miss a dose, take it as soon  as you can. If it is almost time for your next dose, take only that dose. Do not take double or extra doses. What may interact with this medicine? Do not take this medicine with any of the following medications: -bosentan This medicine may also interact with the following medications: -barbiturate medicines for sleep or seizures -bexarotene -carbamazepine -ethotoin -ketoconazole -phenytoin -rifampin This list may not describe all possible interactions. Give your health care provider a list of all the medicines, herbs, non-prescription drugs, or dietary supplements you use. Also tell them if you smoke, drink alcohol, or use illegal drugs. Some items may interact with your medicine. What should I watch for while using this medicine? Visit your doctor or health care professional for regular checks on your progress. This medicine can cause swelling, tenderness, or bleeding of the gums. Be careful when brushing and flossing teeth. See your dentist regularly for routine dental care. You may get drowsy or dizzy. Do not drive, use machinery, or do anything that needs mental alertness until you know how this drug affects you. Do not stand or sit up quickly, especially if you are an older patient. This reduces the risk of dizzy or fainting spells. What side effects may I notice from receiving this medicine? Side effects that you should report to your doctor or health care professional as soon as possible: -allergic reactions like skin rash, itching or hives, swelling of the face, lips, or tongue -breast tissue changes or discharge -changes in vaginal bleeding during your period or between your periods -depression -muscle or bone pain -numbness or pain in the arm or leg -pain in the chest, groin or leg -seizures or tremors -severe  headache -stomach pain -sudden shortness of breath -unusually weak or tired -vision or speech problems -yellowing of skin or eyes Side effects that usually do not  require medical attention (report to your doctor or health care professional if they continue or are bothersome): -acne -fluid retention and swelling -increased in appetite -mood changes, anxiety, depression, frustration, anger, or emotional outbursts -nausea, vomiting -sweating or hot flashes This list may not describe all possible side effects. Call your doctor for medical advice about side effects. You may report side effects to FDA at 1-800-FDA-1088. Where should I keep my medicine? Keep out of the reach of children. Store at room temperature between 15 and 30 degrees C (59 and 86 degrees F). Protect from light. Keep container tightly closed. Throw away any unused medicine after the expiration date. NOTE: This sheet is a summary. It may not cover all possible information. If you have questions about this medicine, talk to your doctor, pharmacist, or health care provider.  2018 Elsevier/Gold Standard (2008-09-04 11:43:48)   Hydroxyprogesterone solution for injection What is this medicine? HYDROXYPROGESTERONE (hye drox ee proe JES ter one) is a female hormone. This medicine is used in women who are pregnant and who have delivered a baby too early (preterm) in the past. It helps lower the risk of having a preterm baby again. This medicine may be used for other purposes; ask your health care provider or pharmacist if you have questions. COMMON BRAND NAME(S): Makena What should I tell my health care provider before I take this medicine? They need to know if you have any of these conditions: -blood clotting disorders -breast, cervical, uterine, or vaginal cancer -depression -diabetes or prediabetes -heart disease -high blood pressure -kidney disease -liver disease -lung or breathing disease, like asthma -migraine headaches -seizures -vaginal bleeding -an unusual or allergic reaction to hydroxyprogesterone, other hormones, medicines, foods, dyes, castor oil, benzyl alcohol, or other  preservatives -breast-feeding How should I use this medicine? This medicine is for injection into a muscle. It is given by a health care professional in a hospital or clinic setting. You are likely to get an injection once a week to prevent preterm delivery. Talk to your pediatrician regarding the use of this medicine in children. Special care may be needed. Overdosage: If you think you have taken too much of this medicine contact a poison control center or emergency room at once. NOTE: This medicine is only for you. Do not share this medicine with others. What if I miss a dose? It is important not to miss your dose. Call your doctor or health care professional if you are unable to keep an appointment. What may interact with this medicine? -acetaminophen -bupropion -clozapine -efavirenz -halothane -methadone -nicotine -theophylline, aminophylline -tizanidine This list may not describe all possible interactions. Give your health care provider a list of all the medicines, herbs, non-prescription drugs, or dietary supplements you use. Also tell them if you smoke, drink alcohol, or use illegal drugs. Some items may interact with your medicine. What should I watch for while using this medicine? Your condition will be monitored carefully while you are receiving this medicine. What side effects may I notice from receiving this medicine? Side effects that you should report to your doctor or health care professional as soon as possible: -allergic reactions like skin rash, itching or hives, swelling of the face, lips, or tongue -breathing problems -breast tissue changes or discharge -changes in vision -confusion, trouble speaking or understanding -depressed mood -increased hunger or thirst -increased urination -  pain, redness, or irritation at site where injected -pain, swelling, warmth in the leg -shortness of breath, chest pain, swelling in a leg -sudden numbness or weakness of the face,  arm or leg -sudden severe headaches -trouble walking, dizziness, loss of balance or coordination -unusually weak or tired -vaginal bleeding -yellowing of the eyes or skin Side effects that usually do not require medical attention (report to your doctor or health care professional if they continue or are bothersome): -changes in emotions or moods -diarrhea -fluid retention and swelling -nausea This list may not describe all possible side effects. Call your doctor for medical advice about side effects. You may report side effects to FDA at 1-800-FDA-1088. Where should I keep my medicine? This drug is given in a hospital or clinic and will not be stored at home. NOTE: This sheet is a summary. It may not cover all possible information. If you have questions about this medicine, talk to your doctor, pharmacist, or health care provider.  2018 Elsevier/Gold Standard (2009-11-10 11:17:12)

## 2018-09-11 LAB — GC/CHLAMYDIA PROBE AMP (~~LOC~~) NOT AT ARMC
Chlamydia: NEGATIVE
Neisseria Gonorrhea: NEGATIVE

## 2018-09-15 ENCOUNTER — Encounter (HOSPITAL_COMMUNITY): Payer: Self-pay | Admitting: Emergency Medicine

## 2018-09-15 ENCOUNTER — Inpatient Hospital Stay (HOSPITAL_COMMUNITY)
Admission: AD | Admit: 2018-09-15 | Discharge: 2018-09-15 | Disposition: A | Discharge status: Home / Self Care | Payer: Medicaid Other | Source: Ambulatory Visit | Attending: Obstetrics & Gynecology | Admitting: Obstetrics & Gynecology

## 2018-09-15 DIAGNOSIS — O26879 Cervical shortening, unspecified trimester: Secondary | ICD-10-CM

## 2018-09-15 DIAGNOSIS — O4692 Antepartum hemorrhage, unspecified, second trimester: Secondary | ICD-10-CM | POA: Diagnosis present

## 2018-09-15 DIAGNOSIS — O0932 Supervision of pregnancy with insufficient antenatal care, second trimester: Secondary | ICD-10-CM

## 2018-09-15 DIAGNOSIS — Z9119 Patient's noncompliance with other medical treatment and regimen: Secondary | ICD-10-CM

## 2018-09-15 DIAGNOSIS — O162 Unspecified maternal hypertension, second trimester: Secondary | ICD-10-CM | POA: Insufficient documentation

## 2018-09-15 DIAGNOSIS — O10919 Unspecified pre-existing hypertension complicating pregnancy, unspecified trimester: Secondary | ICD-10-CM | POA: Diagnosis not present

## 2018-09-15 DIAGNOSIS — R109 Unspecified abdominal pain: Secondary | ICD-10-CM | POA: Diagnosis present

## 2018-09-15 DIAGNOSIS — Z3009 Encounter for other general counseling and advice on contraception: Secondary | ICD-10-CM | POA: Diagnosis not present

## 2018-09-15 DIAGNOSIS — Z8751 Personal history of pre-term labor: Secondary | ICD-10-CM

## 2018-09-15 DIAGNOSIS — O99012 Anemia complicating pregnancy, second trimester: Secondary | ICD-10-CM | POA: Insufficient documentation

## 2018-09-15 DIAGNOSIS — Z79899 Other long term (current) drug therapy: Secondary | ICD-10-CM | POA: Insufficient documentation

## 2018-09-15 DIAGNOSIS — O2692 Pregnancy related conditions, unspecified, second trimester: Secondary | ICD-10-CM

## 2018-09-15 DIAGNOSIS — O0992 Supervision of high risk pregnancy, unspecified, second trimester: Secondary | ICD-10-CM | POA: Diagnosis not present

## 2018-09-15 DIAGNOSIS — O26892 Other specified pregnancy related conditions, second trimester: Secondary | ICD-10-CM | POA: Diagnosis not present

## 2018-09-15 DIAGNOSIS — Z3A22 22 weeks gestation of pregnancy: Secondary | ICD-10-CM | POA: Insufficient documentation

## 2018-09-15 DIAGNOSIS — Z8249 Family history of ischemic heart disease and other diseases of the circulatory system: Secondary | ICD-10-CM | POA: Insufficient documentation

## 2018-09-15 DIAGNOSIS — O0001 Abdominal pregnancy with intrauterine pregnancy: Secondary | ICD-10-CM

## 2018-09-15 DIAGNOSIS — Z91199 Patient's noncompliance with other medical treatment and regimen due to unspecified reason: Secondary | ICD-10-CM

## 2018-09-15 DIAGNOSIS — O0990 Supervision of high risk pregnancy, unspecified, unspecified trimester: Secondary | ICD-10-CM

## 2018-09-15 LAB — URINALYSIS, ROUTINE W REFLEX MICROSCOPIC
Bacteria, UA: NONE SEEN
Bilirubin Urine: NEGATIVE
GLUCOSE, UA: NEGATIVE mg/dL
Hgb urine dipstick: NEGATIVE
Ketones, ur: 80 mg/dL — AB
Leukocytes, UA: NEGATIVE
Nitrite: NEGATIVE
PROTEIN: 30 mg/dL — AB
Specific Gravity, Urine: 1.02 (ref 1.005–1.030)
pH: 6 (ref 5.0–8.0)

## 2018-09-15 LAB — WET PREP, GENITAL
Clue Cells Wet Prep HPF POC: NONE SEEN
Sperm: NONE SEEN
Trich, Wet Prep: NONE SEEN
Yeast Wet Prep HPF POC: NONE SEEN

## 2018-09-15 MED ORDER — DEXTROSE IN LACTATED RINGERS 5 % IV SOLN
Freq: Once | INTRAVENOUS | Status: AC
  Administered 2018-09-15: 07:00:00 via INTRAVENOUS

## 2018-09-15 MED ORDER — M.V.I. ADULT IV INJ
Freq: Once | INTRAVENOUS | Status: AC
  Administered 2018-09-15: 08:00:00 via INTRAVENOUS
  Filled 2018-09-15: qty 10

## 2018-09-15 MED ORDER — ACETAMINOPHEN 500 MG PO TABS
1000.0000 mg | ORAL_TABLET | Freq: Once | ORAL | Status: AC
  Administered 2018-09-15: 1000 mg via ORAL
  Filled 2018-09-15: qty 2

## 2018-09-15 NOTE — MAU Note (Addendum)
Having a lot of pelvic and vaginal pain. Was here few days ago with vaginal pain and told had short cervix and sent home. Pain is worse now and saw pink spotting one time earlier this am. No recent sex

## 2018-09-15 NOTE — Discharge Instructions (Signed)
It is ol for you to take Tylenol 1000 mg every 6 hrs as you need it for pain

## 2018-09-15 NOTE — MAU Provider Note (Addendum)
History     CSN: [id number]  Arrival date and time: 09/15/18 0535   First Provider Initiated Contact with Patient 09/15/18 [phone]      Chief Complaint  Patient presents with  . Abdominal Pain   HPI Kathy Dean is a 37 y.o. [id number] at 896w3d who presents to MAU with concern for abdominal pain 7/10. This is a new problem, onset within the past week. Patient endorses pain across her entire abdomen and pelvis inferior to her umbilicus. Pain does not radiate, no aggravating or alleviating factors. She denies intercourse within the past week. She is taking CBD oil to manage pain.    Patient also complains of spotting x 1 episode early this morning. She denies additional episodes of bleeding.   She states she is having extreme vaginal discharge and has experienced this for the past few weeks. Ae recent visit to MAU determined her to have intact amniotic sac and no concerning findings on wet prep. Patient states she knows this is inaccurate.  Patient states she has not eaten in the past twenty-four hours except for a small Boost shake.  OB History    Gravida  8   Para  4   Term  1   Preterm  3   AB  3   Living  4     SAB  1   TAB  2   Ectopic      Multiple      Live Births  1          Patient Active Problem List   Diagnosis Date Noted  . Unwanted fertility 09/10/2018  . Non-compliance 09/07/2018  . Short cervix affecting pregnancy 09/07/2018  . Anemia in pregnancy 08/27/2018  . Supervision of high risk pregnancy, antepartum 08/23/2018  . History of preterm delivery 08/23/2018  . Late prenatal care in second trimester 08/23/2018  . Chronic hypertension affecting pregnancy 08/23/2018  . Headache 08/23/2018  . Advanced maternal age in multigravida 08/23/2018  . Bacterial vaginitis 12/19/2017     Past Medical History:  Diagnosis Date  . Abnormal Pap smear    rpt was ok  . Anemia   . Chronic hypertension   . Headache(784.0)   . History of blood  transfusion   . Hyperemesis gravidarum   . Pregnancy induced hypertension   . Preterm labor     Past Surgical History:  Procedure Laterality Date  . INDUCED ABORTION    . NO PAST SURGERIES      Family History  Problem Relation Age of Onset  . Stroke Maternal Grandmother   . Cancer Maternal Grandfather   . Hypertension Mother   . Diabetes Mother   . Hypertension Father   . Other Neg Hx     Social History   Tobacco Use  . Smoking status: Never Smoker  . Smokeless tobacco: Never Used  Substance Use Topics  . Alcohol use: Not Currently    Alcohol/week: 21.0 standard drinks    Types: 21 Standard drinks or equivalent per week    Comment: not since pregnancy  . Drug use: Not Currently    Types: Marijuana    Comment: last used 1.5 weeks ago     Allergies:  Allergies  Allergen Reactions  . Flagyl [Metronidazole] Swelling and Dermatitis    Patient states can take gel form.   . Latex Swelling and Dermatitis    Medications Prior to Admission  Medication Sig Dispense Refill Last Dose  . famotidine-calcium carbonate-magnesium hydroxide (PEPCID COMPLETE)  10-800-165 MG chewable tablet Chew 1 tablet by mouth 2 (two) times daily as needed. 60 tablet 2 Past Month at Unknown time  . feeding supplement (BOOST HIGH PROTEIN) LIQD Take 237 mLs by mouth 3 (three) times daily between meals. 30 Can 6 09/14/2018 at Unknown time  . NON FORMULARY Take 5 drops by mouth 2 (two) times a week. CBD oil   09/14/2018 at Unknown time  . OVER THE COUNTER MEDICATION Take 5 mLs by mouth 2 (two) times daily. Black seed oil   09/14/2018 at Unknown time  . terconazole (TERAZOL 7) 0.4 % vaginal cream Place 1 applicator vaginally at bedtime. 45 g 0 Past Month at Unknown time  . glycopyrrolate (ROBINUL) 1 MG tablet Take 1 tablet (1 mg total) by mouth 3 (three) times daily. (Patient not taking: Reported on 09/06/2018) 60 tablet 3 Not Taking  . metoCLOPramide (REGLAN) 10 MG tablet Take 1 tablet (10 mg total) by  mouth 4 (four) times daily -  before meals and at bedtime. (Patient not taking: Reported on 09/06/2018) 60 tablet 3 Not Taking  . ondansetron (ZOFRAN ODT) 8 MG disintegrating tablet Take 1 tablet (8 mg total) by mouth every 8 (eight) hours as needed for nausea or vomiting. (Patient not taking: Reported on 09/06/2018) 20 tablet 0 Not Taking  . promethazine (PHENERGAN) 25 MG tablet Take 0.5-1 tablets (12.5-25 mg total) by mouth every 6 (six) hours as needed. (Patient not taking: Reported on 09/06/2018) 30 tablet 2 Not Taking  . ranitidine (ZANTAC) 150 MG tablet Take 1 tablet (150 mg total) by mouth 2 (two) times daily. (Patient not taking: Reported on 09/06/2018) 60 tablet 0 Not Taking  . scopolamine (TRANSDERM-SCOP, 1.5 MG,) 1 MG/3DAYS Place 1 patch (1.5 mg total) onto the skin every 3 (three) days. (Patient not taking: Reported on 09/06/2018) 10 patch 12 Not Taking    Review of Systems  Constitutional: Negative for chills, fatigue and fever.  Gastrointestinal: Positive for abdominal pain.  Genitourinary: Positive for pelvic pain, vaginal bleeding and vaginal discharge. Negative for difficulty urinating, dyspareunia, dysuria, flank pain and vaginal pain.  Musculoskeletal: Negative for back pain.  Neurological: Negative for headaches.  All other systems reviewed and are negative.  Physical Exam   Blood pressure (!) 118/59, pulse 76, temperature 97.9 F (36.6 C), resp. rate 18, height 5\' 2"  (1.575 m), weight 49.4 kg, last menstrual period 04/11/2018.  Physical Exam  Nursing note and vitals reviewed. Constitutional: She is oriented to person, place, and time. She appears well-developed and well-nourished.  GI: There is no CVA tenderness.  Genitourinary:    Uterus normal.  Cervix exhibits discharge. Cervix exhibits no motion tenderness and no friability.    Vaginal discharge present.     Genitourinary Comments: Vaginal discharge is mucoid with small crystal-like aggregate   Neurological: She is  alert and oriented to person, place, and time.  Skin: Skin is warm and dry.  Psychiatric: She has a normal mood and affect. Her behavior is normal. Judgment and thought content normal.    MAU Course/MDM  Procedures: sterile speculum exam  Previously identified short cervix Closed on SSE No bleeding noted on any part of physical exam FHT 149 by doppler  Patient Vitals for the past 24 hrs:  BP Temp Pulse Resp Height Weight  09/15/18 0542 (!) 118/59 97.9 F (36.6 C) 76 18 5\' 2"  (1.575 m) 49.4 kg    Results for orders placed or performed during the hospital encounter of 09/15/18 (from the past 24  hour(s))  Urinalysis, Routine w reflex microscopic     Status: Abnormal   Collection Time: 09/15/18  5:56 AM  Result Value Ref Range   Color, Urine YELLOW YELLOW   APPearance HAZY (A) CLEAR   Specific Gravity, Urine 1.020 1.005 - 1.030   pH 6.0 5.0 - 8.0   Glucose, UA NEGATIVE NEGATIVE mg/dL   Hgb urine dipstick NEGATIVE NEGATIVE   Bilirubin Urine NEGATIVE NEGATIVE   Ketones, ur 80 (A) NEGATIVE mg/dL   Protein, ur 30 (A) NEGATIVE mg/dL   Nitrite NEGATIVE NEGATIVE   Leukocytes, UA NEGATIVE NEGATIVE   RBC / HPF 0-5 0 - 5 RBC/hpf   WBC, UA 0-5 0 - 5 WBC/hpf   Bacteria, UA NONE SEEN NONE SEEN   Squamous Epithelial / LPF 11-20 0 - 5   Mucus PRESENT   Wet prep, genital     Status: Abnormal   Collection Time: 09/15/18  6:18 AM  Result Value Ref Range   Yeast Wet Prep HPF POC NONE SEEN NONE SEEN   Trich, Wet Prep NONE SEEN NONE SEEN   Clue Cells Wet Prep HPF POC NONE SEEN NONE SEEN   WBC, Wet Prep HPF POC FEW (A) NONE SEEN   Sperm NONE SEEN     Meds ordered this encounter  Medications  . acetaminophen (TYLENOL) tablet 1,000 mg  . dextrose 5 % in lactated ringers infusion  . multivitamins adult (INFUVITE ADULT) 10 mL in lactated ringers 1,000 mL infusion   Report given to R. Arita Miss, CNM who assumes care at this time  Clayton Bibles, [facility] 09/15/18  8:13 AM    Assessment and  Plan  Vaginal bleeding in pregnancy, second trimester - F/U with [facility], if VB continues - Return to MAU for worsening sx's - Information provided on VB in preg 2nd trimester   Abdominal pain in pregnancy - Ok to take Tylenol 1000 mg q 6 hrs prn pain - Information provided on abd pain in preg  - Discharge patient

## 2018-09-21 ENCOUNTER — Ambulatory Visit (INDEPENDENT_AMBULATORY_CARE_PROVIDER_SITE_OTHER): Payer: Medicaid Other | Admitting: Obstetrics & Gynecology

## 2018-09-21 VITALS — BP 121/81 | HR 85 | Wt 115.9 lb

## 2018-09-21 DIAGNOSIS — O10912 Unspecified pre-existing hypertension complicating pregnancy, second trimester: Secondary | ICD-10-CM

## 2018-09-21 DIAGNOSIS — O0992 Supervision of high risk pregnancy, unspecified, second trimester: Secondary | ICD-10-CM

## 2018-09-21 DIAGNOSIS — Z3009 Encounter for other general counseling and advice on contraception: Secondary | ICD-10-CM

## 2018-09-21 DIAGNOSIS — O10919 Unspecified pre-existing hypertension complicating pregnancy, unspecified trimester: Secondary | ICD-10-CM

## 2018-09-21 DIAGNOSIS — Z8751 Personal history of pre-term labor: Secondary | ICD-10-CM

## 2018-09-21 DIAGNOSIS — O0990 Supervision of high risk pregnancy, unspecified, unspecified trimester: Secondary | ICD-10-CM

## 2018-09-21 MED ORDER — PROGESTERONE MICRONIZED 200 MG PO CAPS: ORAL_CAPSULE | ORAL | 3 refills | Status: DC

## 2018-09-21 NOTE — Progress Notes (Signed)
   PRENATAL VISIT NOTE  Subjective:  Kathy Dean is a 37 y.o. [id number] at 4157w2d being seen today for ongoing prenatal care.  She is currently monitored for the following issues for this low-risk pregnancy and has Bacterial vaginitis; Supervision of high risk pregnancy, antepartum; History of preterm delivery; Late prenatal care in second trimester; Chronic hypertension affecting pregnancy; Headache; Advanced maternal age in multigravida; Anemia in pregnancy; Non-compliance; Short cervix affecting pregnancy; Unwanted fertility; and Vaginal bleeding in pregnancy, second trimester on their problem list.  Patient reports no complaints.  Contractions: Irregular. Vag. Bleeding: None.  Movement: Present. Denies leaking of fluid.   The following portions of the patient's history were reviewed and updated as appropriate: allergies, current medications, past family history, past medical history, past social history, past surgical history and problem list. Problem list updated.  Objective:   Vitals:   09/21/18 1132  BP: 121/81  Pulse: 85  Weight: 115 lb 14.4 oz (52.6 kg)    Fetal Status: Fetal Heart Rate (bpm): 135   Movement: Present     General:  Alert, oriented and cooperative. Patient is in no acute distress.  Skin: Skin is warm and dry. No rash noted.   Cardiovascular: Normal heart rate noted  Respiratory: Normal respiratory effort, no problems with respiration noted  Abdomen: Soft, gravid, appropriate for gestational age.  Pain/Pressure: Present     Pelvic: Cervical exam deferred        Extremities: Normal range of motion.  Edema: None  Mental Status: Normal mood and affect. Normal behavior. Normal judgment and thought content.   Assessment and Plan:  Pregnancy: [id number] at 6357w2d 2. History of PTDs- she refuses cerclage, 17P. She is willing to do modified bedrest and prometrium which I have ordered - TVS for cervical length scheduled  There are no diagnoses linked to this  encounter. Preterm labor symptoms and general obstetric precautions including but not limited to vaginal bleeding, contractions, leaking of fluid and fetal movement were reviewed in detail with the patient. Please refer to After Visit Summary for other counseling recommendations.  No follow-ups on file.  No future appointments.  Allie BossierMyra C Chukwudi Ewen, MD

## 2018-09-21 NOTE — Progress Notes (Signed)
Ultrasound scheduled for Monday 12/23 @ 1345. Pt verbalized understanding.

## 2018-09-24 ENCOUNTER — Emergency Department (HOSPITAL_COMMUNITY): Payer: Medicaid Other

## 2018-09-24 ENCOUNTER — Encounter (HOSPITAL_COMMUNITY): Payer: Self-pay | Admitting: *Deleted

## 2018-09-24 ENCOUNTER — Inpatient Hospital Stay (HOSPITAL_COMMUNITY): Admission: RE | Admit: 2018-09-24 | Payer: Medicaid Other | Source: Ambulatory Visit

## 2018-09-24 ENCOUNTER — Emergency Department (HOSPITAL_COMMUNITY)
Admission: EM | Admit: 2018-09-24 | Discharge: 2018-09-24 | Disposition: A | Discharge status: Home / Self Care | Payer: Medicaid Other | Attending: Emergency Medicine | Admitting: Emergency Medicine

## 2018-09-24 DIAGNOSIS — O0932 Supervision of pregnancy with insufficient antenatal care, second trimester: Secondary | ICD-10-CM

## 2018-09-24 DIAGNOSIS — O26879 Cervical shortening, unspecified trimester: Secondary | ICD-10-CM

## 2018-09-24 DIAGNOSIS — Z91040 Latex allergy status: Secondary | ICD-10-CM | POA: Diagnosis not present

## 2018-09-24 DIAGNOSIS — F1290 Cannabis use, unspecified, uncomplicated: Secondary | ICD-10-CM | POA: Diagnosis not present

## 2018-09-24 DIAGNOSIS — O0990 Supervision of high risk pregnancy, unspecified, unspecified trimester: Secondary | ICD-10-CM

## 2018-09-24 DIAGNOSIS — Z9119 Patient's noncompliance with other medical treatment and regimen: Secondary | ICD-10-CM

## 2018-09-24 DIAGNOSIS — Z8751 Personal history of pre-term labor: Secondary | ICD-10-CM

## 2018-09-24 DIAGNOSIS — Z91199 Patient's noncompliance with other medical treatment and regimen due to unspecified reason: Secondary | ICD-10-CM

## 2018-09-24 DIAGNOSIS — O479 False labor, unspecified: Secondary | ICD-10-CM | POA: Diagnosis not present

## 2018-09-24 DIAGNOSIS — O26892 Other specified pregnancy related conditions, second trimester: Secondary | ICD-10-CM | POA: Diagnosis not present

## 2018-09-24 DIAGNOSIS — S199XXA Unspecified injury of neck, initial encounter: Secondary | ICD-10-CM | POA: Diagnosis not present

## 2018-09-24 DIAGNOSIS — O99019 Anemia complicating pregnancy, unspecified trimester: Secondary | ICD-10-CM

## 2018-09-24 DIAGNOSIS — Z79899 Other long term (current) drug therapy: Secondary | ICD-10-CM | POA: Diagnosis not present

## 2018-09-24 DIAGNOSIS — Z3A26 26 weeks gestation of pregnancy: Secondary | ICD-10-CM | POA: Diagnosis not present

## 2018-09-24 DIAGNOSIS — M542 Cervicalgia: Secondary | ICD-10-CM | POA: Diagnosis not present

## 2018-09-24 DIAGNOSIS — Z3A23 23 weeks gestation of pregnancy: Secondary | ICD-10-CM | POA: Diagnosis not present

## 2018-09-24 DIAGNOSIS — R1084 Generalized abdominal pain: Secondary | ICD-10-CM | POA: Diagnosis not present

## 2018-09-24 DIAGNOSIS — R1030 Lower abdominal pain, unspecified: Secondary | ICD-10-CM | POA: Diagnosis not present

## 2018-09-24 DIAGNOSIS — O10919 Unspecified pre-existing hypertension complicating pregnancy, unspecified trimester: Secondary | ICD-10-CM

## 2018-09-24 DIAGNOSIS — O4692 Antepartum hemorrhage, unspecified, second trimester: Secondary | ICD-10-CM

## 2018-09-24 DIAGNOSIS — Z3009 Encounter for other general counseling and advice on contraception: Secondary | ICD-10-CM

## 2018-09-24 DIAGNOSIS — R109 Unspecified abdominal pain: Secondary | ICD-10-CM

## 2018-09-24 DIAGNOSIS — O09529 Supervision of elderly multigravida, unspecified trimester: Secondary | ICD-10-CM

## 2018-09-24 LAB — TYPE AND SCREEN
ABO/RH(D): B POS
ANTIBODY SCREEN: NEGATIVE

## 2018-09-24 LAB — CBC WITH DIFFERENTIAL/PLATELET
ABS IMMATURE GRANULOCYTES: 0.04 10*3/uL (ref 0.00–0.07)
Basophils Absolute: 0 10*3/uL (ref 0.0–0.1)
Basophils Relative: 0 %
Eosinophils Absolute: 0.1 10*3/uL (ref 0.0–0.5)
Eosinophils Relative: 0 %
HCT: 29.3 % — ABNORMAL LOW (ref 36.0–46.0)
Hemoglobin: 9.4 g/dL — ABNORMAL LOW (ref 12.0–15.0)
Immature Granulocytes: 0 %
Lymphocytes Relative: 11 %
Lymphs Abs: 1.2 10*3/uL (ref 0.7–4.0)
MCH: 29.1 pg (ref 26.0–34.0)
MCHC: 32.1 g/dL (ref 30.0–36.0)
MCV: 90.7 fL (ref 80.0–100.0)
Monocytes Absolute: 0.5 10*3/uL (ref 0.1–1.0)
Monocytes Relative: 5 %
NEUTROS ABS: 9.4 10*3/uL — AB (ref 1.7–7.7)
Neutrophils Relative %: 84 %
Platelets: 277 10*3/uL (ref 150–400)
RBC: 3.23 MIL/uL — ABNORMAL LOW (ref 3.87–5.11)
RDW: 13.2 % (ref 11.5–15.5)
WBC: 11.3 10*3/uL — ABNORMAL HIGH (ref 4.0–10.5)
nRBC: 0 % (ref 0.0–0.2)

## 2018-09-24 LAB — COMPREHENSIVE METABOLIC PANEL
ALBUMIN: 3.1 g/dL — AB (ref 3.5–5.0)
ALT: 12 U/L (ref 0–44)
AST: 23 U/L (ref 15–41)
Alkaline Phosphatase: 41 U/L (ref 38–126)
Anion gap: 11 (ref 5–15)
BUN: 7 mg/dL (ref 6–20)
CO2: 19 mmol/L — AB (ref 22–32)
Calcium: 8.7 mg/dL — ABNORMAL LOW (ref 8.9–10.3)
Chloride: 106 mmol/L (ref 98–111)
Creatinine, Ser: 0.68 mg/dL (ref 0.44–1.00)
GFR calc Af Amer: >60 mL/min (ref >60)
GFR calc non Af Amer: >60 mL/min (ref >60)
Glucose, Bld: 123 mg/dL — ABNORMAL HIGH (ref 70–99)
Potassium: 3.4 mmol/L — ABNORMAL LOW (ref 3.5–5.1)
Sodium: 136 mmol/L (ref 135–145)
Total Bilirubin: 0.5 mg/dL (ref 0.3–1.2)
Total Protein: 6.1 g/dL — ABNORMAL LOW (ref 6.5–8.1)

## 2018-09-24 LAB — ABO/RH: ABO/RH(D): B POS
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
## 2018-09-24 NOTE — ED Notes (Signed)
Patient verbalizes understanding of medications and discharge instructions. No further questions at this time. VSS and patient ambulatory at discharge.   

## 2018-09-24 NOTE — Progress Notes (Signed)
Patient transported for CT
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
## 2018-09-24 NOTE — Progress Notes (Signed)
   09/24/18 1800  Clinical Encounter Type  Visited With Patient;Health care provider  Visit Type Trauma  Referral From Nurse  Consult/Referral To Chaplain   PT arrived and being evaluated.  PT stated she wanted to call her mother herself and PT's mother in law present.  No other needs at this time.  Chaplain will follow and support as needed. Chaplain Agustin CreeNewton Stanely Sexson

## 2018-09-24 NOTE — ED Notes (Signed)
Lab called about labs not running and no results.  Lab personnel stated labs were placed in save rack and never run.  Being run now.
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
## 2018-09-25 ENCOUNTER — Telehealth: Payer: Self-pay | Admitting: Family Medicine
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
## 2018-10-03 NOTE — L&D Delivery Note (Signed)
OB/GYN Faculty Practice Delivery Note  Kathy Dean is a 38 y.o. [id number] s/p SVD at [redacted]w[redacted]d. She was admitted for IOL for gHTN.   ROM: 2h 25m with clear fluid GBS Status: negative Maximum Maternal Temperature: 98.38F  Labor Progress: . Augmentation with AROM and Pitocin  Delivery Date/Time: 1626 12/28/2018 Delivery: Called to room and patient was complete and pushing. Head delivered ROA. No nuchal cord present. Shoulder and body delivered in usual fashion. Infant with spontaneous cry, placed on mother's abdomen, dried and stimulated. Cord clamped x 2 after 10-minute delay, and cut by mother. Cord blood drawn. Placenta delivered spontaneously with gentle cord traction. Fundus firm with massage and Pitocin. Labia, perineum, vagina, and cervix inspected inspected with hemostatic L periurethral abrasion and hemostatic superficial perineal abrasion, repair not indicated for either.  Placenta: delivered spontaneously, intact Complications: none Lacerations: L periurethral abrasion - hemostatic, superficial perineal abrasion - hemostatic EBL:  Infant: vigorous female  APGARs 9 and 9  weight pending  Burman Nieves, MD Family Medicine Resident

## 2018-10-05 ENCOUNTER — Ambulatory Visit (INDEPENDENT_AMBULATORY_CARE_PROVIDER_SITE_OTHER): Payer: Medicaid Other | Admitting: Obstetrics & Gynecology

## 2018-10-05 VITALS — BP 117/67 | HR 83 | Wt 112.2 lb

## 2018-10-05 DIAGNOSIS — O10919 Unspecified pre-existing hypertension complicating pregnancy, unspecified trimester: Secondary | ICD-10-CM

## 2018-10-05 DIAGNOSIS — O0990 Supervision of high risk pregnancy, unspecified, unspecified trimester: Secondary | ICD-10-CM

## 2018-10-05 DIAGNOSIS — O0932 Supervision of pregnancy with insufficient antenatal care, second trimester: Secondary | ICD-10-CM

## 2018-10-05 DIAGNOSIS — O09522 Supervision of elderly multigravida, second trimester: Secondary | ICD-10-CM

## 2018-10-05 DIAGNOSIS — Z3009 Encounter for other general counseling and advice on contraception: Secondary | ICD-10-CM
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
## 2018-10-10 ENCOUNTER — Other Ambulatory Visit: Payer: Self-pay | Admitting: Obstetrics and Gynecology

## 2018-10-10 ENCOUNTER — Ambulatory Visit (HOSPITAL_COMMUNITY)
Admission: RE | Admit: 2018-10-10 | Discharge: 2018-10-10 | Disposition: A | Discharge status: Home / Self Care | Payer: Medicaid Other | Source: Ambulatory Visit | Attending: Obstetrics and Gynecology | Admitting: Obstetrics and Gynecology

## 2018-10-10 DIAGNOSIS — Z8751 Personal history of pre-term labor: Secondary | ICD-10-CM

## 2018-10-10 DIAGNOSIS — O09212 Supervision of pregnancy with history of pre-term labor, second trimester: Secondary | ICD-10-CM

## 2018-10-10 DIAGNOSIS — Z3686 Encounter for antenatal screening for cervical length: Secondary | ICD-10-CM | POA: Diagnosis not present

## 2018-10-10 DIAGNOSIS — O09522 Supervision of elderly multigravida, second trimester: Secondary | ICD-10-CM | POA: Diagnosis not present

## 2018-10-10 DIAGNOSIS — Z3A26 26 weeks gestation of pregnancy: Secondary | ICD-10-CM | POA: Diagnosis not present

## 2018-10-10 DIAGNOSIS — O0990 Supervision of high risk pregnancy, unspecified, unspecified trimester: Secondary | ICD-10-CM

## 2018-10-10 DIAGNOSIS — O3432 Maternal care for cervical incompetence, second trimester: Secondary | ICD-10-CM

## 2018-10-17 ENCOUNTER — Other Ambulatory Visit: Payer: Self-pay | Admitting: *Deleted

## 2018-10-17 DIAGNOSIS — O0990 Supervision of high risk pregnancy, unspecified, unspecified trimester: Secondary | ICD-10-CM

## 2018-10-18 ENCOUNTER — Other Ambulatory Visit: Payer: Medicaid Other

## 2018-10-18 ENCOUNTER — Encounter: Payer: Medicaid Other | Admitting: Obstetrics & Gynecology

## 2018-10-19 ENCOUNTER — Other Ambulatory Visit: Payer: Self-pay

## 2018-10-19 ENCOUNTER — Inpatient Hospital Stay (HOSPITAL_COMMUNITY)
Admission: AD | Admit: 2018-10-19 | Discharge: 2018-10-19 | Disposition: A | Discharge status: Home / Self Care | Payer: Medicaid Other | Attending: Obstetrics & Gynecology | Admitting: Obstetrics & Gynecology

## 2018-10-19 ENCOUNTER — Encounter (HOSPITAL_COMMUNITY): Payer: Self-pay | Admitting: *Deleted

## 2018-10-19 DIAGNOSIS — Z3A26 26 weeks gestation of pregnancy: Secondary | ICD-10-CM | POA: Insufficient documentation

## 2018-10-19 DIAGNOSIS — O26879 Cervical shortening, unspecified trimester: Secondary | ICD-10-CM

## 2018-10-19 DIAGNOSIS — O26872 Cervical shortening, second trimester: Secondary | ICD-10-CM | POA: Diagnosis not present

## 2018-10-19 LAB — WET PREP, GENITAL
Clue Cells Wet Prep HPF POC: NONE SEEN
Sperm: NONE SEEN
Trich, Wet Prep: NONE SEEN
Yeast Wet Prep HPF POC: NONE SEEN

## 2018-10-19 MED ORDER — BETAMETHASONE SOD PHOS & ACET 6 (3-3) MG/ML IJ SUSP
12.0000 mg | Freq: Once | INTRAMUSCULAR | Status: AC
  Administered 2018-10-19: 12 mg via INTRAMUSCULAR
  Filled 2018-10-19: qty 2

## 2018-10-19 NOTE — MAU Note (Signed)
Lisa leftwich-kirby CNM in to evaluate pt, Pt very rude and raising her voice at her. Baby not tracing because patient sitting up in bed. Dr Debroah Loop called to evaluate pt

## 2018-10-19 NOTE — MAU Note (Signed)
Pt here for betamethasone.  States "couldn't stay when they wanted her to", been some days. Wanting to know if they are going to check her today.  Has been contracting. Lot of discharged, right now it feels like pee is running down her leg.

## 2018-10-19 NOTE — MAU Note (Signed)
Pt left before signing and going over discharge instructions
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
## 2018-10-19 NOTE — Discharge Instructions (Signed)
Preterm Labor and Birth Information °Pregnancy normally lasts 39-41 weeks. Preterm labor is when labor starts early. It starts before you have been pregnant for 37 whole weeks. °What are the risk factors for preterm labor? °Preterm labor is more likely to occur in women who: °· Have an infection while pregnant. °· Have a cervix that is short. °· Have gone into preterm labor before. °· Have had surgery on their cervix. °· Are younger than age 39. °· Are older than age 35. °· Are African American. °· Are pregnant with two or more babies. °· Take street drugs while pregnant. °· Smoke while pregnant. °· Do not gain enough weight while pregnant. °· Got pregnant right after another pregnancy. °What are the symptoms of preterm labor? °Symptoms of preterm labor include: °· Cramps. The cramps may feel like the cramps some women get during their period. The cramps may happen with watery poop (diarrhea). °· Pain in the belly (abdomen). °· Pain in the lower back. °· Regular contractions or tightening. It may feel like your belly is getting tighter. °· Pressure in the lower belly that seems to get stronger. °· More fluid (discharge) leaking from the vagina. The fluid may be watery or bloody. °· Water breaking. °Why is it important to notice signs of preterm labor? °Babies who are born early may not be fully developed. They have a higher chance for: °· Long-term heart problems. °· Long-term lung problems. °· Trouble controlling body systems, like breathing. °· Bleeding in the brain. °· A condition called cerebral palsy. °· Learning difficulties. °· Death. °These risks are highest for babies who are born before 34 weeks of pregnancy. °How is preterm labor treated? °Treatment depends on: °· How long you were pregnant. °· Your condition. °· The health of your baby. °Treatment may involve: °· Having a stitch (suture) placed in your cervix. When you give birth, your cervix opens so the baby can come out. The stitch keeps the cervix  from opening too soon. °· Staying at the hospital. °· Taking or getting medicines, such as: °? Hormone medicines. °? Medicines to stop contractions. °? Medicines to help the baby’s lungs develop. °? Medicines to prevent your baby from having cerebral palsy. °What should I do if I am in preterm labor? °If you think you are going into labor too soon, call your doctor right away. °How can I prevent preterm labor? °· Do not use any tobacco products. °? Examples of these are cigarettes, chewing tobacco, and e-cigarettes. °? If you need help quitting, ask your doctor. °· Do not use street drugs. °· Do not use any medicines unless you ask your doctor if they are safe for you. °· Talk with your doctor before taking any herbal supplements. °· Make sure you gain enough weight. °· Watch for infection. If you think you might have an infection, get it checked right away. °· If you have gone into preterm labor before, tell your doctor. °This information is not intended to replace advice given to you by your health care provider. Make sure you discuss any questions you have with your health care provider. °Document Released: 12/16/2008 Document Revised: 03/01/2016 Document Reviewed: 02/10/2016 °Elsevier Interactive Patient Education © 2019 [employer] ° °

## 2018-10-22 ENCOUNTER — Encounter: Payer: Medicaid Other | Admitting: Family Medicine

## 2018-10-22 ENCOUNTER — Telehealth: Payer: Self-pay | Admitting: Family Medicine

## 2018-10-22 ENCOUNTER — Other Ambulatory Visit: Payer: Medicaid Other

## 2018-10-22 LAB — GC/CHLAMYDIA PROBE AMP (~~LOC~~) NOT AT ARMC
Chlamydia: NEGATIVE
Neisseria Gonorrhea: NEGATIVE

## 2018-10-22 NOTE — Telephone Encounter (Signed)
Called pt to get her rescheduled for missed 2hr gtt and hob appt. No answer left detailed message with about time and date. Also left office number if need to reschedule or have any questions.

## 2018-11-02 ENCOUNTER — Encounter: Payer: Medicaid Other | Admitting: Family Medicine

## 2018-11-02 ENCOUNTER — Other Ambulatory Visit: Payer: Medicaid Other

## 2018-11-14 ENCOUNTER — Encounter (HOSPITAL_COMMUNITY): Payer: Self-pay | Admitting: *Deleted

## 2018-11-14 ENCOUNTER — Inpatient Hospital Stay (HOSPITAL_COMMUNITY)
Admission: AD | Admit: 2018-11-14 | Discharge: 2018-11-14 | Disposition: A | Discharge status: Home / Self Care | Payer: Medicaid Other | Attending: Obstetrics & Gynecology | Admitting: Obstetrics & Gynecology

## 2018-11-14 DIAGNOSIS — N760 Acute vaginitis: Secondary | ICD-10-CM

## 2018-11-14 DIAGNOSIS — O10919 Unspecified pre-existing hypertension complicating pregnancy, unspecified trimester: Secondary | ICD-10-CM

## 2018-11-14 DIAGNOSIS — O4703 False labor before 37 completed weeks of gestation, third trimester: Secondary | ICD-10-CM

## 2018-11-14 DIAGNOSIS — Z3A29 29 weeks gestation of pregnancy: Secondary | ICD-10-CM | POA: Diagnosis not present

## 2018-11-14 DIAGNOSIS — B9689 Other specified bacterial agents as the cause of diseases classified elsewhere: Secondary | ICD-10-CM | POA: Diagnosis not present

## 2018-11-14 DIAGNOSIS — Z91199 Patient's noncompliance with other medical treatment and regimen due to unspecified reason: Secondary | ICD-10-CM

## 2018-11-14 DIAGNOSIS — O0990 Supervision of high risk pregnancy, unspecified, unspecified trimester: Secondary | ICD-10-CM

## 2018-11-14 DIAGNOSIS — O0932 Supervision of pregnancy with insufficient antenatal care, second trimester: Secondary | ICD-10-CM

## 2018-11-14 DIAGNOSIS — Z9119 Patient's noncompliance with other medical treatment and regimen: Secondary | ICD-10-CM

## 2018-11-14 DIAGNOSIS — O26879 Cervical shortening, unspecified trimester: Secondary | ICD-10-CM

## 2018-11-14 DIAGNOSIS — Z3009 Encounter for other general counseling and advice on contraception: Secondary | ICD-10-CM

## 2018-11-14 DIAGNOSIS — O23593 Infection of other part of genital tract in pregnancy, third trimester: Secondary | ICD-10-CM | POA: Diagnosis not present

## 2018-11-14 DIAGNOSIS — O4692 Antepartum hemorrhage, unspecified, second trimester: Secondary | ICD-10-CM

## 2018-11-14 DIAGNOSIS — Z8751 Personal history of pre-term labor: Secondary | ICD-10-CM

## 2018-11-14 LAB — URINALYSIS, MICROSCOPIC (REFLEX)

## 2018-11-14 LAB — URINALYSIS, ROUTINE W REFLEX MICROSCOPIC
Bilirubin Urine: NEGATIVE
Glucose, UA: NEGATIVE mg/dL
Hgb urine dipstick: NEGATIVE
Ketones, ur: 15 mg/dL — AB
Nitrite: NEGATIVE
Protein, ur: NEGATIVE mg/dL
Specific Gravity, Urine: 1.02 (ref 1.005–1.030)
pH: 7 (ref 5.0–8.0)

## 2018-11-14 LAB — WET PREP, GENITAL
Sperm: NONE SEEN
Trich, Wet Prep: NONE SEEN
Yeast Wet Prep HPF POC: NONE SEEN

## 2018-11-14 MED ORDER — METRONIDAZOLE 0.75 % VA GEL: 1.0000 | Freq: Two times a day (BID) | VAGINAL | 0 refills | Status: DC

## 2018-11-14 NOTE — MAU Provider Note (Signed)
History     CSN: [id number]  Arrival date and time: 11/14/18 1118   First Provider Initiated Contact with Patient 11/14/18 1153      Chief Complaint  Patient presents with  . Contractions  . Rupture of Membranes   Kathy Dean is a 38 y.o. Y4M2500 at [redacted]w[redacted]d presenting with contractions and copious discharge, she is concerned she is beginning to dilate more. She is very polite, explaining that she does not particularly like Western medicine and likes to be involved in her care decisions. She drinks a lot of herbal teas, to include: tumeric, ginger, chamomile (unsure which, but takes it for nausea), cinnamon, vanilla, and dandelion - all in green or black teas. She also takes CBD oil for nausea. She is trying to get back on an alkaline diet, and hasn't had much of an appetite the past week.    OB History    Gravida  8   Para  4   Term  1   Preterm  3   AB  3   Living  4     SAB  1   TAB  2   Ectopic      Multiple      Live Births  4           Past Medical History:  Diagnosis Date  . Abnormal Pap smear    rpt was ok  . Anemia   . Chronic hypertension   . Headache(784.0)   . History of blood transfusion   . Hyperemesis gravidarum   . Pregnancy induced hypertension   . Preterm labor     Past Surgical History:  Procedure Laterality Date  . INDUCED ABORTION    . NO PAST SURGERIES      Family History  Problem Relation Age of Onset  . Stroke Maternal Grandmother   . Cancer Maternal Grandfather   . Hypertension Mother   . Diabetes Mother   . Hypertension Father   . Other Neg Hx     Social History   Tobacco Use  . Smoking status: Never Smoker  . Smokeless tobacco: Never Used  Substance Use Topics  . Alcohol use: Not Currently    Alcohol/week: 21.0 standard drinks    Types: 21 Standard drinks or equivalent per week    Comment: not since pregnancy  . Drug use: Not on file    Comment: last used 1.5 weeks ago / uses CBD oil    Allergies:   Allergies  Allergen Reactions  . Flagyl [Metronidazole] Swelling and Dermatitis    Patient states can take gel form.   . Latex Swelling and Dermatitis    Medications Prior to Admission  Medication Sig Dispense Refill Last Dose  . feeding supplement (BOOST HIGH PROTEIN) LIQD Take 237 mLs by mouth 3 (three) times daily between meals. 30 Can 6 Taking  . progesterone (PROMETRIUM) 200 MG capsule Place one capsule vaginally at bedtime 30 capsule 3 Taking    Review of Systems  Constitutional: Negative.  Negative for fatigue and fever.  HENT: Negative.  Negative for congestion.   Eyes: Negative.  Negative for photophobia and visual disturbance.  Respiratory: Negative.  Negative for cough, chest tightness and shortness of breath.   Cardiovascular: Negative.  Negative for chest pain and leg swelling.  Gastrointestinal: Negative.  Negative for abdominal pain, constipation, diarrhea, nausea and vomiting.  Endocrine: Negative.   Genitourinary: Positive for pelvic pain. Negative for decreased urine volume, difficulty urinating, dysuria and vaginal discharge.  Musculoskeletal: Negative.   Skin: Negative.   Allergic/Immunologic: Negative.   Neurological: Negative.  Negative for dizziness and headaches.  Hematological: Negative.   Psychiatric/Behavioral: Negative.    Physical Exam   Blood pressure (!) 116/50, pulse 97, temperature 98 F (36.7 C), temperature source Oral, resp. rate 16, height 5' 1.5" (1.562 m), weight 54.9 kg, last menstrual period 04/11/2018, SpO2 100 %.  Physical Exam  Nursing note and vitals reviewed. Constitutional: She appears well-developed and well-nourished. No distress.  HENT:  Head: Normocephalic.  Eyes: Pupils are equal, round, and reactive to light.  Neck: Normal range of motion.  Cardiovascular: Normal rate.  Respiratory: Effort normal.  GI: Soft. She exhibits no distension. There is no abdominal tenderness.  Genitourinary:    Vaginal discharge (copious  runny white) present.     Genitourinary Comments: Dilation: 1 Effacement (%): 50 Cervical Position: Anterior Exam by:: Tyler Aas, SNM    Skin: She is not diaphoretic.    MAU Course  Procedures  MDM UA, wet prep, gc/ct, fern test UA showed mucus and ketones, wet prep pos for BV, fern test neg Patient prefers metrogel, has a reaction to Flagyl (N/V, rash, and itching). Patient still having contractions q4-63min with irritability, but declines procardia or terbutaline to slow them down.  Chose PO hydration instead of IV fluids.  Roman chamomile commonly used to ease morning sickness and nausea can also cause preterm labor contractions, and excessive use of black tea known to cause uterine irritability due to caffeine per [employer].   Assessment and Plan  Preterm uterine contractions in 3rd trimester - discharge to home Bacterial vaginosis - metrogel sent to pharmacy Discussed (at length) the need to stop drinking chamomile and black teas, increase hydration and nutrition. Encouraged her to stop the alkaline diet for the remainder of her pregnancy as it does not provide enough nutrients. Patient verbalized understanding and agreed to plan of care.  Preterm labor precautions given. Encouraged patient to return to care with [facility] for her remaining ROB appts.   Bernerd Limbo, SNM 11/14/2018, 12:54 PM

## 2018-11-14 NOTE — MAU Note (Signed)
Pt reports ? Contractions x 2 days off/on. Now pain is more constant and stronger, ? Leaking fluid since this am.

## 2018-11-14 NOTE — Discharge Instructions (Signed)
Bacterial Vaginosis  Bacterial vaginosis is an infection of the vagina. It happens when too many normal germs (healthy bacteria) grow in the vagina. This infection puts you at risk for infections from sex (STIs). Treating this infection can lower your risk for some STIs. You should also treat this if you are pregnant. It can cause your baby to be born early. Follow these instructions at home: Medicines  Take over-the-counter and prescription medicines only as told by your doctor.  Take or use your antibiotic medicine as told by your doctor. Do not stop taking or using it even if you start to feel better. General instructions  If you your sexual partner is a woman, tell her that you have this infection. She needs to get treatment if she has symptoms. If you have a female partner, he does not need to be treated.  During treatment: ? Avoid sex. ? Do not douche. ? Avoid alcohol as told. ? Avoid breastfeeding as told.  Drink enough fluid to keep your pee (urine) clear or pale yellow.  Keep your vagina and butt (rectum) clean. ? Wash the area with warm water every day. ? Wipe from front to back after you use the toilet.  Keep all follow-up visits as told by your doctor. This is important. Preventing this condition  Do not douche.  Use only warm water to wash around your vagina.  Use protection when you have sex. This includes: ? Latex condoms. ? Dental dams.  Limit how many people you have sex with. It is best to only have sex with the same person (be monogamous).  Get tested for STIs. Have your partner get tested.  Wear underwear that is cotton or lined with cotton.  Avoid tight pants and pantyhose. This is most important in summer.  Do not use any products that have nicotine or tobacco in them. These include cigarettes and e-cigarettes. If you need help quitting, ask your doctor.  Do not use illegal drugs.  Limit how much alcohol you drink. Contact a doctor if:  Your  symptoms do not get better, even after you are treated.  You have more discharge or pain when you pee (urinate).  You have a fever.  You have pain in your belly (abdomen).  You have pain with sex.  Your bleed from your vagina between periods. Summary  This infection happens when too many germs (bacteria) grow in the vagina.  Treating this condition can lower your risk for some infections from sex (STIs).  You should also treat this if you are pregnant. It can cause early (premature) birth.  Do not stop taking or using your antibiotic medicine even if you start to feel better. This information is not intended to replace advice given to you by your health care provider. Make sure you discuss any questions you have with your health care provider. Document Released: 06/28/2008 Document Revised: 06/04/2016 Document Reviewed: 06/04/2016 Elsevier Interactive Patient Education  2019 [employer].  Preterm Labor and Birth Information Pregnancy normally lasts 39-41 weeks. Preterm labor is when labor starts early. It starts before you have been pregnant for 37 whole weeks. What are the risk factors for preterm labor? Preterm labor is more likely to occur in women who:  Have an infection while pregnant.  Have a cervix that is short.  Have gone into preterm labor before.  Have had surgery on their cervix.  Are younger than age 50.  Are older than age 50.  Are African American.  Are  pregnant with two or more babies.  Take street drugs while pregnant.  Smoke while pregnant.  Do not gain enough weight while pregnant.  Got pregnant right after another pregnancy. What are the symptoms of preterm labor? Symptoms of preterm labor include:  Cramps. The cramps may feel like the cramps some women get during their period. The cramps may happen with watery poop (diarrhea).  Pain in the belly (abdomen).  Pain in the lower back.  Regular contractions or tightening. It may feel like  your belly is getting tighter.  Pressure in the lower belly that seems to get stronger.  More fluid (discharge) leaking from the vagina. The fluid may be watery or bloody.  Water breaking. Why is it important to notice signs of preterm labor? Babies who are born early may not be fully developed. They have a higher chance for:  Long-term heart problems.  Long-term lung problems.  Trouble controlling body systems, like breathing.  Bleeding in the brain.  A condition called cerebral palsy.  Learning difficulties.  Death. These risks are highest for babies who are born before 34 weeks of pregnancy. How is preterm labor treated? Treatment depends on:  How long you were pregnant.  Your condition.  The health of your baby. Treatment may involve:  Having a stitch (suture) placed in your cervix. When you give birth, your cervix opens so the baby can come out. The stitch keeps the cervix from opening too soon.  Staying at the hospital.  Taking or getting medicines, such as: ? Hormone medicines. ? Medicines to stop contractions. ? Medicines to help the babys lungs develop. ? Medicines to prevent your baby from having cerebral palsy. What should I do if I am in preterm labor? If you think you are going into labor too soon, call your doctor right away. How can I prevent preterm labor?  Do not use any tobacco products. ? Examples of these are cigarettes, chewing tobacco, and e-cigarettes. ? If you need help quitting, ask your doctor.  Do not use street drugs.  Do not use any medicines unless you ask your doctor if they are safe for you.  Talk with your doctor before taking any herbal supplements.  Make sure you gain enough weight.  Watch for infection. If you think you might have an infection, get it checked right away.  If you have gone into preterm labor before, tell your doctor. This information is not intended to replace advice given to you by your health care  provider. Make sure you discuss any questions you have with your health care provider. Document Released: 12/16/2008 Document Revised: 03/01/2016 Document Reviewed: 02/10/2016 Elsevier Interactive Patient Education  2019 [employer]

## 2018-11-15 LAB — GC/CHLAMYDIA PROBE AMP (~~LOC~~) NOT AT ARMC
CHLAMYDIA, DNA PROBE: NEGATIVE
Neisseria Gonorrhea: NEGATIVE

## 2018-12-02 ENCOUNTER — Inpatient Hospital Stay (HOSPITAL_BASED_OUTPATIENT_CLINIC_OR_DEPARTMENT_OTHER): Payer: Medicaid Other

## 2018-12-02 ENCOUNTER — Encounter (HOSPITAL_COMMUNITY): Payer: Self-pay

## 2018-12-02 ENCOUNTER — Inpatient Hospital Stay (HOSPITAL_COMMUNITY)
Admission: AD | Admit: 2018-12-02 | Discharge: 2018-12-02 | Disposition: A | Discharge status: Home / Self Care | Payer: Medicaid Other | Source: Ambulatory Visit | Attending: Obstetrics & Gynecology | Admitting: Obstetrics & Gynecology

## 2018-12-02 DIAGNOSIS — O99013 Anemia complicating pregnancy, third trimester: Secondary | ICD-10-CM

## 2018-12-02 DIAGNOSIS — O3433 Maternal care for cervical incompetence, third trimester: Secondary | ICD-10-CM | POA: Diagnosis not present

## 2018-12-02 DIAGNOSIS — Z91199 Patient's noncompliance with other medical treatment and regimen due to unspecified reason: Secondary | ICD-10-CM

## 2018-12-02 DIAGNOSIS — O479 False labor, unspecified: Secondary | ICD-10-CM | POA: Diagnosis present

## 2018-12-02 DIAGNOSIS — O09523 Supervision of elderly multigravida, third trimester: Secondary | ICD-10-CM

## 2018-12-02 DIAGNOSIS — Z9119 Patient's noncompliance with other medical treatment and regimen: Secondary | ICD-10-CM

## 2018-12-02 DIAGNOSIS — O9A213 Injury, poisoning and certain other consequences of external causes complicating pregnancy, third trimester: Secondary | ICD-10-CM

## 2018-12-02 DIAGNOSIS — Z3A33 33 weeks gestation of pregnancy: Secondary | ICD-10-CM | POA: Insufficient documentation

## 2018-12-02 DIAGNOSIS — O4703 False labor before 37 completed weeks of gestation, third trimester: Secondary | ICD-10-CM | POA: Diagnosis not present

## 2018-12-02 DIAGNOSIS — O26893 Other specified pregnancy related conditions, third trimester: Secondary | ICD-10-CM | POA: Insufficient documentation

## 2018-12-02 DIAGNOSIS — W010XXA Fall on same level from slipping, tripping and stumbling without subsequent striking against object, initial encounter: Secondary | ICD-10-CM | POA: Diagnosis not present

## 2018-12-02 DIAGNOSIS — O26873 Cervical shortening, third trimester: Secondary | ICD-10-CM | POA: Diagnosis not present

## 2018-12-02 DIAGNOSIS — O0932 Supervision of pregnancy with insufficient antenatal care, second trimester: Secondary | ICD-10-CM

## 2018-12-02 DIAGNOSIS — O4692 Antepartum hemorrhage, unspecified, second trimester: Secondary | ICD-10-CM

## 2018-12-02 DIAGNOSIS — O4700 False labor before 37 completed weeks of gestation, unspecified trimester: Secondary | ICD-10-CM

## 2018-12-02 DIAGNOSIS — O26879 Cervical shortening, unspecified trimester: Secondary | ICD-10-CM

## 2018-12-02 DIAGNOSIS — O09213 Supervision of pregnancy with history of pre-term labor, third trimester: Secondary | ICD-10-CM | POA: Diagnosis not present

## 2018-12-02 DIAGNOSIS — O0990 Supervision of high risk pregnancy, unspecified, unspecified trimester: Secondary | ICD-10-CM

## 2018-12-02 DIAGNOSIS — O10919 Unspecified pre-existing hypertension complicating pregnancy, unspecified trimester: Secondary | ICD-10-CM

## 2018-12-02 DIAGNOSIS — Z3009 Encounter for other general counseling and advice on contraception: Secondary | ICD-10-CM

## 2018-12-02 DIAGNOSIS — Z8751 Personal history of pre-term labor: Secondary | ICD-10-CM

## 2018-12-02 LAB — URINALYSIS, ROUTINE W REFLEX MICROSCOPIC
BILIRUBIN URINE: NEGATIVE
Glucose, UA: NEGATIVE mg/dL
Hgb urine dipstick: NEGATIVE
Ketones, ur: NEGATIVE mg/dL
Leukocytes,Ua: NEGATIVE
NITRITE: NEGATIVE
Protein, ur: NEGATIVE mg/dL
Specific Gravity, Urine: 1.001 — ABNORMAL LOW (ref 1.005–1.030)
pH: 7 (ref 5.0–8.0)

## 2018-12-02 LAB — WET PREP, GENITAL
Sperm: NONE SEEN
Trich, Wet Prep: NONE SEEN
Yeast Wet Prep HPF POC: NONE SEEN
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
## 2018-12-02 NOTE — Discharge Instructions (Signed)
Someone from [facility] should call you to get you scheduled or to tell you they cannot take you as a transfer. If you have not heard from them by Wednesday, call their office.

## 2018-12-02 NOTE — MAU Note (Signed)
Pt is a G8P4 at 33.4 weeks reporting a fall onto her back this morning.  Pt reports no LOF, no bleeding, good fetal movement.    Pt also noted that she is having more intense more frequent ctx pattern.  Pt is concerned that the contractions are changing her cervix.  Pt declined medical interventions but would like to know if she is dilating so that "I can know if I need to stay close to home."

## 2018-12-02 NOTE — MAU Note (Signed)
Kathy Dean is a 38 y.o. at [redacted]w[redacted]d here in MAU reporting: fell when leaving the grocery store this morning within the past hour. States she fell backwards and landed on her bottom. Did not hit her belly. Having lower abdominal and back pain. Has not seen any bleeding, having some increased discharge. Has not felt any fetal movement since fall  Onset of complaint: within the past hour  Pain score: abdominal pain 7/10, back pain 5/10  Vitals:   12/02/18 0741  BP: 133/75  Pulse: (!) 107  Resp: 18  Temp: 97.8 F (36.6 C)  SpO2: 100%      FHT:152  Lab orders placed from triage: UA

## 2018-12-04 ENCOUNTER — Encounter: Payer: Self-pay | Admitting: Obstetrics & Gynecology

## 2018-12-04 ENCOUNTER — Ambulatory Visit (INDEPENDENT_AMBULATORY_CARE_PROVIDER_SITE_OTHER): Payer: Medicaid Other | Admitting: Obstetrics & Gynecology

## 2018-12-04 VITALS — BP 111/75 | HR 91 | Wt 122.0 lb

## 2018-12-04 DIAGNOSIS — Z3A33 33 weeks gestation of pregnancy: Secondary | ICD-10-CM

## 2018-12-04 DIAGNOSIS — O0990 Supervision of high risk pregnancy, unspecified, unspecified trimester: Secondary | ICD-10-CM

## 2018-12-04 DIAGNOSIS — O0993 Supervision of high risk pregnancy, unspecified, third trimester: Secondary | ICD-10-CM

## 2018-12-04 NOTE — Patient Instructions (Signed)

## 2018-12-04 NOTE — Progress Notes (Signed)
   PRENATAL VISIT NOTE  Subjective:  Kathy Dean is a 38 y.o. [id number] at [redacted]w[redacted]d being seen today for ongoing prenatal care.  She is currently monitored for the following issues for this high-risk pregnancy and has Bacterial vaginitis; Supervision of high risk pregnancy, antepartum; Late prenatal care in second trimester; Chronic hypertension affecting pregnancy; Headache; Advanced maternal age in multigravida; Anemia in pregnancy; Non-compliance; Short cervix affecting pregnancy; Unwanted fertility; Vaginal bleeding in pregnancy, second trimester; Traumatic injury during pregnancy in third trimester; and Preterm contractions on their problem list.  Patient reports no complaints.  Contractions: Irregular. Vag. Bleeding: None.  Movement: Present. Denies leaking of fluid.   The following portions of the patient's history were reviewed and updated as appropriate: allergies, current medications, past family history, past medical history, past social history, past surgical history and problem list. Problem list updated.  Objective:   Vitals:   12/04/18 1309  BP: 111/75  Pulse: 91  Weight: 122 lb (55.3 kg)    Fetal Status: Fetal Heart Rate (bpm): 160   Movement: Present     General:  Alert, oriented and cooperative. Patient is in no acute distress.  Skin: Skin is warm and dry. No rash noted.   Cardiovascular: Normal heart rate noted  Respiratory: Normal respiratory effort, no problems with respiration noted  Abdomen: Soft, gravid, appropriate for gestational age.  Pain/Pressure: Present     Pelvic: Cervical exam deferred        Extremities: Normal range of motion.     Mental Status: Normal mood and affect. Normal behavior. Normal judgment and thought content.   Assessment and Plan:  Pregnancy: [id number] at [redacted]w[redacted]d  1. Supervision of high risk pregnancy, antepartum Reviewed [place] result and OB history   Preterm labor symptoms and general obstetric precautions including but not limited to  vaginal bleeding, contractions, leaking of fluid and fetal movement were reviewed in detail with the patient. Please refer to After Visit Summary for other counseling recommendations.  Return in about 2 weeks (around 12/18/2018).  Future Appointments  Date Time Provider Department Center  12/18/2018 11:15 AM Hermina Staggers, MD [facility] None    Scheryl Darter, MD

## 2018-12-11 ENCOUNTER — Ambulatory Visit (INDEPENDENT_AMBULATORY_CARE_PROVIDER_SITE_OTHER): Payer: Medicaid Other

## 2018-12-11 VITALS — BP 132/78 | HR 92 | Wt 121.0 lb

## 2018-12-11 DIAGNOSIS — Z3A34 34 weeks gestation of pregnancy: Secondary | ICD-10-CM

## 2018-12-11 DIAGNOSIS — O4703 False labor before 37 completed weeks of gestation, third trimester: Secondary | ICD-10-CM

## 2018-12-11 NOTE — Progress Notes (Signed)
    Subjective:  Kathy Dean is a 38 y.o. [id number] at [redacted]w[redacted]d being seen today for problem visit during her prenatal period.  She is currently monitored for the following issues for this high-risk pregnancy and has Bacterial vaginitis; Supervision of high risk pregnancy, antepartum; Late prenatal care in second trimester; Chronic hypertension affecting pregnancy; Headache; Advanced maternal age in multigravida; Anemia in pregnancy; Non-compliance; Short cervix affecting pregnancy; Unwanted fertility; Vaginal bleeding in pregnancy, second trimester; Traumatic injury during pregnancy in third trimester; and Preterm contractions on their problem list.  Patient reports backache, contractions since last visit and mucous discharge.  She states her contractions are now every 5-8 minutes with numbness and tingling radiating from her back to her feet particularly to her left side.  She states that she has not taken any medication because she "doesn't take western medication."  Patient expresses ongoing vaginal discharge that is mucousy in nature and has been constant despite BV treatment. She states she was told that she taking vaginal progesterone, but discontinued usage at 27 weeks when the baby became viable.      Contractions: Irregular. Vag. Bleeding: None.  Movement: Present. Denies leaking of fluid.   The following portions of the patient's history were reviewed and updated as appropriate: allergies, current medications, past family history, past medical history, past social history, past surgical history and problem list.   Objective:   Vitals:   12/11/18 1133  BP: 132/78  Pulse: 92  Weight: 121 lb (54.9 kg)    Fetal Status: Fetal Heart Rate (bpm): 146   Movement: Present     General:  Alert, oriented and cooperative. Patient is in no acute distress.  Skin: Skin is warm and dry. No rash noted.   Cardiovascular: Normal heart rate noted  Respiratory: Normal respiratory effort, no problems with  respiration noted  Abdomen: Soft, gravid, appropriate for gestational age. Pain/Pressure: Present     Pelvic:  Cervical exam performed Dilation: 1 Effacement (%): 50 Station: -3  Vault: Pink mucosa.  Copious amt thick yellowish mucoid discharge in posterior fornix. Cervix: Pink, no lesions, cysts, or polyps.  Appears slightly open. No active bleeding, mucoid discharge noted.   Extremities: Normal range of motion.     Mental Status: Normal mood and affect. Normal behavior. Normal judgment and thought content.   Urinalysis:  Deferred  Assessment and Plan:  Pregnancy: V2N1916 at [redacted]w[redacted]d  1. Preterm uterine contractions in third trimester, antepartum -Patient offered pain medication for contractions and leg pain-declines. -Offered and declines steroid dosing for fetal lung maturity. -Informed that cervical exam remains the same. Patient appears disappointed, but has no questions or concerns. -Encouraged to report to MAU if symptoms worsen or with onset of new symptoms. -Instructed to keep next PNV for March 17   There are no diagnoses linked to this encounter. Preterm labor symptoms and general obstetric precautions including but not limited to vaginal bleeding, contractions, leaking of fluid and fetal movement were reviewed in detail with the patient. Please refer to After Visit Summary for other counseling recommendations.  No follow-ups on file.   Gerrit Heck, CNM

## 2018-12-17 ENCOUNTER — Other Ambulatory Visit: Payer: Self-pay

## 2018-12-17 ENCOUNTER — Encounter (HOSPITAL_COMMUNITY): Payer: Self-pay | Admitting: *Deleted

## 2018-12-17 ENCOUNTER — Inpatient Hospital Stay (HOSPITAL_COMMUNITY)
Admission: AD | Admit: 2018-12-17 | Discharge: 2018-12-17 | Disposition: A | Discharge status: Home / Self Care | Payer: Medicaid Other | Attending: Obstetrics and Gynecology | Admitting: Obstetrics and Gynecology

## 2018-12-17 DIAGNOSIS — O4703 False labor before 37 completed weeks of gestation, third trimester: Secondary | ICD-10-CM | POA: Diagnosis not present

## 2018-12-17 DIAGNOSIS — O4700 False labor before 37 completed weeks of gestation, unspecified trimester: Secondary | ICD-10-CM

## 2018-12-17 DIAGNOSIS — O479 False labor, unspecified: Secondary | ICD-10-CM | POA: Diagnosis present

## 2018-12-17 DIAGNOSIS — Z3A35 35 weeks gestation of pregnancy: Secondary | ICD-10-CM

## 2018-12-17 HISTORY — DX: Unspecified abnormal cytological findings in specimens from vagina: R87.629

## 2018-12-17 MED ORDER — BETAMETHASONE SOD PHOS & ACET 6 (3-3) MG/ML IJ SUSP: 12.0000 mg | Freq: Once | INTRAMUSCULAR | Status: DC

## 2018-12-17 NOTE — MAU Note (Signed)
Pt presents to MAU with complaints of contractions that are getting more regular over the last couple of days. Pt denies any VB or abnormal discharge

## 2018-12-17 NOTE — Discharge Instructions (Signed)

## 2018-12-17 NOTE — MAU Provider Note (Signed)
History     CSN: [id number]  Arrival date and time: 12/17/18 1413   First Provider Initiated Contact with Patient 12/17/18 1512      Chief Complaint  Patient presents with  . Contractions  . Rupture of Membranes   HPI  Ms.  JIAN LEMARR is a 38 y.o. year old [id number] female at [redacted]w[redacted]d weeks gestation who presents to MAU reporting regular UC's since Saturday, but stronger since 0600 this AM. She felt something "squirt out" while being roomed in MAU; fern slide obtained. She denies VB. She reports good (+) FM today. She was last seen at [facility] on 12/11/2018.  Past Medical History:  Diagnosis Date  . Abnormal Pap smear    rpt was ok  . Anemia   . Chronic hypertension   . Headache(784.0)   . History of blood transfusion   . Hyperemesis gravidarum   . Pregnancy induced hypertension   . Preterm labor   . Vaginal Pap smear, abnormal     Past Surgical History:  Procedure Laterality Date  . INDUCED ABORTION    . NO PAST SURGERIES      Family History  Problem Relation Age of Onset  . Stroke Maternal Grandmother   . Cancer Maternal Grandfather   . Hypertension Mother   . Diabetes Mother   . Hypertension Father   . Other Neg Hx     Social History   Tobacco Use  . Smoking status: Never Smoker  . Smokeless tobacco: Never Used  Substance Use Topics  . Alcohol use: Not Currently    Alcohol/week: 21.0 standard drinks    Types: 21 Standard drinks or equivalent per week    Comment: not since pregnancy  . Drug use: Not on file    Comment: last used 1.5 weeks ago / uses CBD oil    Allergies:  Allergies  Allergen Reactions  . Flagyl [Metronidazole] Swelling and Dermatitis    Patient states can take gel form.   . Latex Swelling and Dermatitis    Medications Prior to Admission  Medication Sig Dispense Refill Last Dose  . feeding supplement (BOOST HIGH PROTEIN) LIQD Take 237 mLs by mouth 3 (three) times daily between meals. 30 Can 6 Taking  . OVER THE COUNTER  MEDICATION    Taking  . progesterone (PROMETRIUM) 200 MG capsule Place one capsule vaginally at bedtime 30 capsule 3 Taking    Review of Systems  Constitutional: Negative.   HENT: Negative.   Eyes: Negative.   Respiratory: Negative.   Cardiovascular: Negative.   Gastrointestinal: Negative.   Endocrine: Negative.   Genitourinary: Positive for pelvic pain (regular UC's over past 2 days).  Musculoskeletal: Negative.   Skin: Negative.   Allergic/Immunologic: Negative.   Neurological: Negative.   Hematological: Negative.   Psychiatric/Behavioral: Negative.    Physical Exam   Blood pressure 128/74, pulse 96, temperature (!) 97.4 F (36.3 C), resp. rate 16, weight 56.7 kg, last menstrual period 04/11/2018.  Physical Exam  Nursing note and vitals reviewed. Constitutional: She is oriented to person, place, and time. She appears well-developed and well-nourished.  HENT:  Head: Normocephalic and atraumatic.  Eyes: Pupils are equal, round, and reactive to light.  Neck: Normal range of motion.  Cardiovascular: Normal rate.  Respiratory: Effort normal.  GI: Soft.  Genitourinary:    Genitourinary Comments: Dilation: 3 Effacement (%): 70 Cervical Position: Middle Station: -2 Presentation: Vertex Exam by: Carloyn Jaeger, CNM   *unchanged 1 hour later   Musculoskeletal: Normal range of  motion.  Neurological: She is alert and oriented to person, place, and time.  Skin: Skin is warm and dry.  Psychiatric: She has a normal mood and affect. Her behavior is normal. Judgment and thought content normal.    MAU Course  Procedures  MDM NST - FHR: 130 bpm / moderate variability / accels present / decels absent / TOCO: regular every 1-4 mins  Offered BMZ 12 mg IM -- pt declined stating that she will "go home and take the vaginal progesterone suppositories she has at home."  *Consult with Dr. Debroah Loop @ [phone] - notified of patient's complaints, assessments, lab & NST results, recommended tx plan  BMZ 12 mg IM prior to d/c home, PTL precautions  Assessment and Plan  Preterm contractions - Plan: Discharge patient - Advised to schedule an OB appt ASAP - Information provided on PTL and PTB - Patient verbalized an understanding of the plan of care and agrees.   Raelyn Mora, MSN, CNM 12/17/2018, 3:12 PM

## 2018-12-17 NOTE — MAU Note (Signed)
Pt states "something is squirting out."

## 2018-12-18 ENCOUNTER — Encounter: Payer: Medicaid Other | Admitting: Obstetrics and Gynecology

## 2018-12-19 ENCOUNTER — Other Ambulatory Visit: Payer: Self-pay

## 2018-12-19 ENCOUNTER — Ambulatory Visit (INDEPENDENT_AMBULATORY_CARE_PROVIDER_SITE_OTHER): Payer: Medicaid Other | Admitting: Family Medicine

## 2018-12-19 VITALS — BP 136/84 | HR 114 | Wt 123.0 lb

## 2018-12-19 DIAGNOSIS — O10913 Unspecified pre-existing hypertension complicating pregnancy, third trimester: Secondary | ICD-10-CM

## 2018-12-19 DIAGNOSIS — Z3A36 36 weeks gestation of pregnancy: Secondary | ICD-10-CM

## 2018-12-19 DIAGNOSIS — O10919 Unspecified pre-existing hypertension complicating pregnancy, unspecified trimester: Secondary | ICD-10-CM

## 2018-12-19 DIAGNOSIS — O0990 Supervision of high risk pregnancy, unspecified, unspecified trimester: Secondary | ICD-10-CM

## 2018-12-19 DIAGNOSIS — O09523 Supervision of elderly multigravida, third trimester: Secondary | ICD-10-CM

## 2018-12-19 NOTE — Patient Instructions (Signed)

## 2018-12-19 NOTE — Progress Notes (Signed)
   PRENATAL VISIT NOTE  Subjective:  Kathy Dean is a 38 y.o. [id number] at [redacted]w[redacted]d being seen today for ongoing prenatal care.  She is currently monitored for the following issues for this high-risk pregnancy and has Bacterial vaginitis; Supervision of high risk pregnancy, antepartum; Late prenatal care in second trimester; Chronic hypertension affecting pregnancy; Headache; Advanced maternal age in multigravida; Anemia in pregnancy; Non-compliance; Short cervix affecting pregnancy; Unwanted fertility; Vaginal bleeding in pregnancy, second trimester; Traumatic injury during pregnancy in third trimester; and Preterm contractions on their problem list.  Patient reports no complaints.  Contractions: Irregular. Vag. Bleeding: None.  Movement: Present. Denies leaking of fluid.   The following portions of the patient's history were reviewed and updated as appropriate: allergies, current medications, past family history, past medical history, past social history, past surgical history and problem list.   Objective:   Vitals:   12/19/18 1339  BP: 136/84  Pulse: (!) 114  Weight: 123 lb (55.8 kg)    Fetal Status: Fetal Heart Rate (bpm): 150   Movement: Present     General:  Alert, oriented and cooperative. Patient is in no acute distress.  Skin: Skin is warm and dry. No rash noted.   Cardiovascular: Normal heart rate noted  Respiratory: Normal respiratory effort, no problems with respiration noted  Abdomen: Soft, gravid, appropriate for gestational age.  Pain/Pressure: Present     Pelvic: Cervical exam performed Dilation: 2.5 Effacement (%): 70 Station: -2 curdlike discharge consistent with yeast.  Extremities: Normal range of motion.     Mental Status: Normal mood and affect. Normal behavior. Normal judgment and thought content.   Assessment and Plan:  Pregnancy: [id number] at [redacted]w[redacted]d 1. Chronic hypertension affecting pregnancy Declined ASA early on I have reviewed blood pressures back to 2012 and  I do not see obvious trend of HTN. In fact there are 2 diastolics in the 90s only in September of 2018 with normal systolics. She reported treating with CBD oil at new OB. BP's have been ok, thus far.  2. Supervision of high risk pregnancy, antepartum Patient was certain she was positive this pregnancy. I personally reviewed every lab done this pregnancy and could not find a result. States she is positive every pregnancy. Will check today and treat in labor if indicated. - Strep Gp B NAA  3. Multigravida of advanced maternal age in third trimester Declined testing  4. Yeast - presumptive Denies vaginal odor or irritation. States this is not bothering her, so have elected not to treat.  Preterm labor symptoms and general obstetric precautions including but not limited to vaginal bleeding, contractions, leaking of fluid and fetal movement were reviewed in detail with the patient. Please refer to After Visit Summary for other counseling recommendations.   Return in 1 week (on 12/26/2018).  No future appointments.  Reva Bores, MD

## 2018-12-21 LAB — STREP GP B NAA: Strep Gp B NAA: NEGATIVE

## 2018-12-23 ENCOUNTER — Inpatient Hospital Stay (HOSPITAL_COMMUNITY)
Admission: AD | Admit: 2018-12-23 | Discharge: 2018-12-23 | Disposition: A | Discharge status: Home / Self Care | Payer: Medicaid Other | Attending: Obstetrics and Gynecology | Admitting: Obstetrics and Gynecology

## 2018-12-23 ENCOUNTER — Other Ambulatory Visit: Payer: Self-pay

## 2018-12-23 ENCOUNTER — Encounter (HOSPITAL_COMMUNITY): Payer: Self-pay | Admitting: *Deleted

## 2018-12-23 DIAGNOSIS — O26893 Other specified pregnancy related conditions, third trimester: Secondary | ICD-10-CM | POA: Insufficient documentation

## 2018-12-23 DIAGNOSIS — Z91199 Patient's noncompliance with other medical treatment and regimen due to unspecified reason: Secondary | ICD-10-CM

## 2018-12-23 DIAGNOSIS — O26879 Cervical shortening, unspecified trimester: Secondary | ICD-10-CM

## 2018-12-23 DIAGNOSIS — O0932 Supervision of pregnancy with insufficient antenatal care, second trimester: Secondary | ICD-10-CM

## 2018-12-23 DIAGNOSIS — O10919 Unspecified pre-existing hypertension complicating pregnancy, unspecified trimester: Secondary | ICD-10-CM

## 2018-12-23 DIAGNOSIS — O133 Gestational [pregnancy-induced] hypertension without significant proteinuria, third trimester: Secondary | ICD-10-CM | POA: Insufficient documentation

## 2018-12-23 DIAGNOSIS — O479 False labor, unspecified: Secondary | ICD-10-CM

## 2018-12-23 DIAGNOSIS — Z3689 Encounter for other specified antenatal screening: Secondary | ICD-10-CM | POA: Diagnosis not present

## 2018-12-23 DIAGNOSIS — O9A213 Injury, poisoning and certain other consequences of external causes complicating pregnancy, third trimester: Secondary | ICD-10-CM

## 2018-12-23 DIAGNOSIS — Z3009 Encounter for other general counseling and advice on contraception: Secondary | ICD-10-CM

## 2018-12-23 DIAGNOSIS — Z9119 Patient's noncompliance with other medical treatment and regimen: Secondary | ICD-10-CM

## 2018-12-23 DIAGNOSIS — N898 Other specified noninflammatory disorders of vagina: Secondary | ICD-10-CM

## 2018-12-23 DIAGNOSIS — O4700 False labor before 37 completed weeks of gestation, unspecified trimester: Secondary | ICD-10-CM

## 2018-12-23 DIAGNOSIS — R102 Pelvic and perineal pain: Secondary | ICD-10-CM | POA: Diagnosis not present

## 2018-12-23 DIAGNOSIS — O4692 Antepartum hemorrhage, unspecified, second trimester: Secondary | ICD-10-CM

## 2018-12-23 DIAGNOSIS — O4703 False labor before 37 completed weeks of gestation, third trimester: Secondary | ICD-10-CM | POA: Diagnosis not present

## 2018-12-23 DIAGNOSIS — Z3A36 36 weeks gestation of pregnancy: Secondary | ICD-10-CM | POA: Diagnosis not present

## 2018-12-23 DIAGNOSIS — O0990 Supervision of high risk pregnancy, unspecified, unspecified trimester: Secondary | ICD-10-CM

## 2018-12-23 LAB — URINALYSIS, ROUTINE W REFLEX MICROSCOPIC
Bilirubin Urine: NEGATIVE
Glucose, UA: NEGATIVE mg/dL
HGB URINE DIPSTICK: NEGATIVE
Ketones, ur: NEGATIVE mg/dL
Leukocytes,Ua: NEGATIVE
Nitrite: NEGATIVE
Protein, ur: NEGATIVE mg/dL
Specific Gravity, Urine: 1.008 (ref 1.005–1.030)
pH: 7 (ref 5.0–8.0)

## 2018-12-23 LAB — WET PREP, GENITAL
Clue Cells Wet Prep HPF POC: NONE SEEN
SPERM: NONE SEEN
Trich, Wet Prep: NONE SEEN
Yeast Wet Prep HPF POC: NONE SEEN

## 2018-12-23 LAB — POCT FERN TEST: POCT Fern Test: NEGATIVE

## 2018-12-23 MED ORDER — LACTATED RINGERS IV BOLUS
1000.0000 mL | Freq: Once | INTRAVENOUS | Status: AC
  Administered 2018-12-23: 1000 mL via INTRAVENOUS

## 2018-12-23 NOTE — MAU Provider Note (Signed)
History     CSN: [id number]  Arrival date and time: 12/23/18 0028   First Provider Initiated Contact with Patient 12/23/18 0110      Chief Complaint  Patient presents with  . Vaginal Pain   HPI Kathy Dean is a 38 y.o. [id number] at [redacted]w[redacted]d who presents to MAU for evaluation of green vaginal discharge. This is a recurring problem which patient feels is getting progressively worse as her pregnancy progresses.  Patient also complains if irregular abdominal contractions. She states they've been a problem for the "past month or so" but she only considers them painful "every one in awhile".   She denies urinary symptoms, vaginal bleeding, leaking of fluid, decreased fetal movement, fever, falls, or recent illness.  She denies recent sexual intercourse.    OB History    Gravida  8   Para  4   Term  4   Preterm  0   AB  3   Living  4     SAB  1   TAB  2   Ectopic      Multiple      Live Births  4           Past Medical History:  Diagnosis Date  . Abnormal Pap smear    rpt was ok  . Anemia   . Chronic hypertension   . Headache(784.0)   . History of blood transfusion   . Hyperemesis gravidarum   . Pregnancy induced hypertension   . Preterm labor   . Vaginal Pap smear, abnormal     Past Surgical History:  Procedure Laterality Date  . INDUCED ABORTION    . NO PAST SURGERIES      Family History  Problem Relation Age of Onset  . Stroke Maternal Grandmother   . Cancer Maternal Grandfather   . Hypertension Mother   . Diabetes Mother   . Hypertension Father   . Other Neg Hx     Social History   Tobacco Use  . Smoking status: Never Smoker  . Smokeless tobacco: Never Used  Substance Use Topics  . Alcohol use: Not Currently    Alcohol/week: 21.0 standard drinks    Types: 21 Standard drinks or equivalent per week    Comment: not since pregnancy  . Drug use: Not on file    Comment: last used 1.5 weeks ago / uses CBD oil    Allergies:  Allergies   Allergen Reactions  . Flagyl [Metronidazole] Swelling and Dermatitis    Patient states can take gel form.   . Latex Swelling and Dermatitis    Medications Prior to Admission  Medication Sig Dispense Refill Last Dose  . feeding supplement (BOOST HIGH PROTEIN) LIQD Take 237 mLs by mouth 3 (three) times daily between meals. 30 Can 6 12/23/2018 at Unknown time  . OVER THE COUNTER MEDICATION    12/23/2018 at Unknown time  . progesterone (PROMETRIUM) 200 MG capsule Place one capsule vaginally at bedtime 30 capsule 3 Past Week at Unknown time    Review of Systems  Constitutional: Negative for chills, fatigue and fever.  Respiratory: Negative for shortness of breath.   Gastrointestinal: Positive for abdominal pain.  Genitourinary: Positive for vaginal discharge. Negative for difficulty urinating, flank pain, vaginal bleeding and vaginal pain.  Musculoskeletal: Negative for back pain.  Neurological: Negative for headaches.  All other systems reviewed and are negative.  Physical Exam   Blood pressure 130/84, pulse 90, temperature 98.3 F (36.8 C), resp. rate  18, height 5\' 2"  (1.575 m), weight 56.7 kg, last menstrual period 04/11/2018.  Physical Exam  Nursing note and vitals reviewed. Constitutional: She is oriented to person, place, and time. She appears well-developed and well-nourished.  Cardiovascular: Normal rate.  Respiratory: Effort normal.  GI: She exhibits no distension. There is no abdominal tenderness. There is no rebound and no guarding.  Gravid  Neurological: She is alert and oriented to person, place, and time.  Skin: Skin is warm and dry.  Psychiatric: She has a normal mood and affect. Her behavior is normal. Judgment and thought content normal.   Thick white discharge near cervix No bleeding, no green discharge noted Cervix 3cm, unchanged from previous  MAU Course/MDM  Procedures: SSE  --Reactive tracing: baseline 135, moderate variability, positive accels, no  decels --Toco: irregular ctx q 4-5 min, not felt by patient, palpate mild --Contraction pattern not significantly impacted by fluid bolus --Negative pooling, negative fern  Patient Vitals for the past 24 hrs:  BP Temp Temp src Pulse Resp Height Weight  12/23/18 0319 125/80 98.1 F (36.7 C) Oral 91 18 - -  12/23/18 0035 130/84 98.3 F (36.8 C) - 90 18 5\' 2"  (1.575 m) 56.7 kg    Results for orders placed or performed during the hospital encounter of 12/23/18 (from the past 24 hour(s))  Urinalysis, Routine w reflex microscopic     Status: Abnormal   Collection Time: 12/23/18 12:45 AM  Result Value Ref Range   Color, Urine STRAW (A) YELLOW   APPearance CLEAR CLEAR   Specific Gravity, Urine 1.008 1.005 - 1.030   pH 7.0 5.0 - 8.0   Glucose, UA NEGATIVE NEGATIVE mg/dL   Hgb urine dipstick NEGATIVE NEGATIVE   Bilirubin Urine NEGATIVE NEGATIVE   Ketones, ur NEGATIVE NEGATIVE mg/dL   Protein, ur NEGATIVE NEGATIVE mg/dL   Nitrite NEGATIVE NEGATIVE   Leukocytes,Ua NEGATIVE NEGATIVE  Wet prep, genital     Status: Abnormal   Collection Time: 12/23/18  1:07 AM  Result Value Ref Range   Yeast Wet Prep HPF POC NONE SEEN NONE SEEN   Trich, Wet Prep NONE SEEN NONE SEEN   Clue Cells Wet Prep HPF POC NONE SEEN NONE SEEN   WBC, Wet Prep HPF POC MODERATE (A) NONE SEEN   Sperm NONE SEEN   Fern Test     Status: Normal   Collection Time: 12/23/18  2:02 AM  Result Value Ref Range   POCT Fern Test Negative = intact amniotic membranes    Meds ordered this encounter  Medications  . lactated ringers bolus 1,000 mL   Assessment and Plan  --38 y.o. [id number] at [redacted]w[redacted]d  --Reactive tracing --Intact amniotic sac  --Normotensive --Discharge home in stable condition  F/U: Next OB appt [facility]  Calvert Cantor 12/23/2018, 3:53 AM

## 2018-12-23 NOTE — Progress Notes (Signed)
Blind swab for specimens

## 2018-12-23 NOTE — Discharge Instructions (Signed)

## 2018-12-23 NOTE — Progress Notes (Signed)
Sam Weinhold CNM in earlier to discuss test results and d/c plan. Written and verbal d/c instructions given and understanding voiced. 

## 2018-12-23 NOTE — Progress Notes (Signed)
Pt texting and watching tv

## 2018-12-23 NOTE — MAU Note (Signed)
Having vaginal pain all day. Pain is pressure and sore. Constant. Having greenish d/c but is not new. D/C is more watery than usual.

## 2018-12-23 NOTE — MAU Note (Signed)
PT mentioned "puddles" of green watery d/c to [facility] CNM. CNM did spec exam and fern slide prepared. ONly saw some ? Yeast. No green watery d/c currently

## 2018-12-24 LAB — GC/CHLAMYDIA PROBE AMP (~~LOC~~) NOT AT ARMC
Chlamydia: NEGATIVE
Neisseria Gonorrhea: NEGATIVE

## 2018-12-25 ENCOUNTER — Encounter: Payer: Self-pay | Admitting: *Deleted

## 2018-12-27 ENCOUNTER — Encounter (HOSPITAL_COMMUNITY): Payer: Self-pay | Admitting: *Deleted

## 2018-12-27 ENCOUNTER — Inpatient Hospital Stay (EMERGENCY_DEPARTMENT_HOSPITAL)
Admission: AD | Admit: 2018-12-27 | Discharge: 2018-12-27 | Discharge status: Against Medical Advice | Payer: Medicaid Other | Source: Home / Self Care | Attending: Obstetrics & Gynecology | Admitting: Obstetrics & Gynecology

## 2018-12-27 ENCOUNTER — Other Ambulatory Visit: Payer: Self-pay

## 2018-12-27 DIAGNOSIS — Z809 Family history of malignant neoplasm, unspecified: Secondary | ICD-10-CM | POA: Insufficient documentation

## 2018-12-27 DIAGNOSIS — Z833 Family history of diabetes mellitus: Secondary | ICD-10-CM | POA: Insufficient documentation

## 2018-12-27 DIAGNOSIS — Z8759 Personal history of other complications of pregnancy, childbirth and the puerperium: Secondary | ICD-10-CM

## 2018-12-27 DIAGNOSIS — Z881 Allergy status to other antibiotic agents status: Secondary | ICD-10-CM | POA: Insufficient documentation

## 2018-12-27 DIAGNOSIS — Z5321 Procedure and treatment not carried out due to patient leaving prior to being seen by health care provider: Secondary | ICD-10-CM | POA: Insufficient documentation

## 2018-12-27 DIAGNOSIS — Z3A36 36 weeks gestation of pregnancy: Secondary | ICD-10-CM | POA: Diagnosis not present

## 2018-12-27 DIAGNOSIS — O471 False labor at or after 37 completed weeks of gestation: Secondary | ICD-10-CM | POA: Insufficient documentation

## 2018-12-27 DIAGNOSIS — O10913 Unspecified pre-existing hypertension complicating pregnancy, third trimester: Secondary | ICD-10-CM | POA: Diagnosis not present

## 2018-12-27 DIAGNOSIS — Z5320 Procedure and treatment not carried out because of patient's decision for unspecified reasons: Principal | ICD-10-CM

## 2018-12-27 DIAGNOSIS — Z5329 Procedure and treatment not carried out because of patient's decision for other reasons: Secondary | ICD-10-CM

## 2018-12-27 DIAGNOSIS — O10013 Pre-existing essential hypertension complicating pregnancy, third trimester: Secondary | ICD-10-CM

## 2018-12-27 DIAGNOSIS — O99013 Anemia complicating pregnancy, third trimester: Secondary | ICD-10-CM | POA: Insufficient documentation

## 2018-12-27 DIAGNOSIS — Z91040 Latex allergy status: Secondary | ICD-10-CM

## 2018-12-27 DIAGNOSIS — O479 False labor, unspecified: Secondary | ICD-10-CM

## 2018-12-27 LAB — CBC
HEMATOCRIT: 28.4 % — AB (ref 36.0–46.0)
HEMOGLOBIN: 9 g/dL — AB (ref 12.0–15.0)
MCH: 26.8 pg (ref 26.0–34.0)
MCHC: 31.7 g/dL (ref 30.0–36.0)
MCV: 84.5 fL (ref 80.0–100.0)
Platelets: 230 10*3/uL (ref 150–400)
RBC: 3.36 MIL/uL — ABNORMAL LOW (ref 3.87–5.11)
RDW: 13.3 % (ref 11.5–15.5)
WBC: 8.8 10*3/uL (ref 4.0–10.5)
nRBC: 0.3 % — ABNORMAL HIGH (ref 0.0–0.2)

## 2018-12-27 LAB — COMPREHENSIVE METABOLIC PANEL
ALT: 17 U/L (ref 0–44)
AST: 24 U/L (ref 15–41)
Albumin: 2.9 g/dL — ABNORMAL LOW (ref 3.5–5.0)
Alkaline Phosphatase: 82 U/L (ref 38–126)
Anion gap: 12 (ref 5–15)
BILIRUBIN TOTAL: 0.7 mg/dL (ref 0.3–1.2)
BUN: 11 mg/dL (ref 6–20)
CO2: 20 mmol/L — ABNORMAL LOW (ref 22–32)
Calcium: 9 mg/dL (ref 8.9–10.3)
Chloride: 104 mmol/L (ref 98–111)
Creatinine, Ser: 0.65 mg/dL (ref 0.44–1.00)
GFR calc Af Amer: >60 mL/min (ref >60)
Glucose, Bld: 117 mg/dL — ABNORMAL HIGH (ref 70–99)
Potassium: 3.8 mmol/L (ref 3.5–5.1)
Sodium: 136 mmol/L (ref 135–145)
Total Protein: 6.5 g/dL (ref 6.5–8.1)

## 2018-12-27 LAB — PROTEIN / CREATININE RATIO, URINE
CREATININE, URINE: 68.98 mg/dL
Protein Creatinine Ratio: 0.25 mg/mg{Cre} — ABNORMAL HIGH (ref 0.00–0.15)
Total Protein, Urine: 17 mg/dL

## 2018-12-27 NOTE — MAU Note (Signed)
Pt reports to MAU c/o ctxs that are regular and more intense than they have been. Pt reports no bleeding or gushes of fluid.

## 2018-12-27 NOTE — Progress Notes (Signed)
Per E. Lawrence, NP pt removed EFM and BP cuff.  Declines all pain meds offered.

## 2018-12-27 NOTE — Progress Notes (Addendum)
Pt left AMA, refused to sign AMA form.  As she walked by nurse's station pt stated "y'all can call me with the lab results."  Left ambulatory in stable condition.

## 2018-12-27 NOTE — MAU Provider Note (Addendum)
Chief Complaint:  Contractions   None    HPI: Kathy Dean is a 38 y.o. [id number] at [redacted]w[redacted]d who presents to maternity admissions reporting contractions. I'm seeing patient d/t elevated BPs.  Denies LOF, vaginal bleeding, headache, visual disturbance, or epigastric pain. Does endorse some ringing in her ears. Normal fetal movement.   Past Medical History:  Diagnosis Date  . Abnormal Pap smear    rpt was ok  . Anemia   . Chronic hypertension   . Headache(784.0)   . History of blood transfusion   . Hyperemesis gravidarum   . Pregnancy induced hypertension   . Preterm labor   . Vaginal Pap smear, abnormal    OB History  Gravida Para Term Preterm AB Living  8 4 4  0 3 4  SAB TAB Ectopic Multiple Live Births  1 2     4     # Outcome Date GA Lbr Len/2nd Weight Sex Delivery Anes PTL Lv  8 Current           7 Term 04/24/14 [redacted]w[redacted]d 17:30 / 00:40 3310 g M Vag-Spont EPI  LIV  6 Term 07/24/06 [redacted]w[redacted]d  2778 g  Vag-Spont   LIV  5 Term 08/05/98 [redacted]w[redacted]d  2722 g  Vag-Spont   LIV     Complications: Anemia affecting second pregnancy  4 Term 09/08/96 [redacted]w[redacted]d  3459 g  Vag-Spont   LIV  3 SAB           2 TAB           1 TAB            Past Surgical History:  Procedure Laterality Date  . INDUCED ABORTION    . NO PAST SURGERIES     Family History  Problem Relation Age of Onset  . Stroke Maternal Grandmother   . Cancer Maternal Grandfather   . Hypertension Mother   . Diabetes Mother   . Hypertension Father   . Other Neg Hx    Social History   Tobacco Use  . Smoking status: Never Smoker  . Smokeless tobacco: Never Used  Substance Use Topics  . Alcohol use: Not Currently    Alcohol/week: 21.0 standard drinks    Types: 21 Standard drinks or equivalent per week    Comment: not since pregnancy  . Drug use: Not on file    Comment: last used 1.5 weeks ago / uses CBD oil   Allergies  Allergen Reactions  . Flagyl [Metronidazole] Swelling and Dermatitis    Patient states can take gel form.   .  Latex Swelling and Dermatitis   Medications Prior to Admission  Medication Sig Dispense Refill Last Dose  . feeding supplement (BOOST HIGH PROTEIN) LIQD Take 237 mLs by mouth 3 (three) times daily between meals. 30 Can 6 Unknown at Unknown time  . OVER THE COUNTER MEDICATION    12/23/2018 at Unknown time  . progesterone (PROMETRIUM) 200 MG capsule Place one capsule vaginally at bedtime 30 capsule 3 Unknown at Unknown time    I have reviewed patient's Past Medical Hx, Surgical Hx, Family Hx, Social Hx, medications and allergies.   ROS:  Review of Systems  Constitutional: Negative.   HENT: Positive for tinnitus.   Eyes: Negative for visual disturbance.  Gastrointestinal: Positive for abdominal pain.  Genitourinary: Negative.   Neurological: Negative for headaches.    Physical Exam   Patient Vitals for the past 24 hrs:  BP Temp Temp src Pulse Resp SpO2  12/27/18 0831 Marland Kitchen)  121/99 - - 93 - -  12/27/18 0816 137/86 - - 90 - -  12/27/18 0801 133/90 - - (!) 105 - -  12/27/18 0800 - - - - - 100 %  12/27/18 0746 (!) 172/97 - - (!) 126 - -  12/27/18 0745 - - - - - 100 %  12/27/18 0730 (!) 140/91 98.6 F (37 C) Oral 91 18 100 %  12/27/18 0716 132/86 - - 88 - -  12/27/18 0715 - - - - - 100 %  12/27/18 0700 125/82 - - 94 - -  12/27/18 0645 (!) 141/89 - - 92 - -  12/27/18 0639 136/81 98 F (36.7 C) Oral 82 17 -    Constitutional: Well-developed, well-nourished female in no acute distress.  Cardiovascular: normal rate & rhythm, no murmur Respiratory: normal effort, lung sounds clear throughout GI: Abd soft, non-tender, gravid appropriate for gestational age. Pos BS x 4 MS: Extremities nontender, no edema, normal ROM Neurologic: Alert and oriented x 4.     Dilation: 3.5 Effacement (%): 60 Cervical Position: Posterior Station: -2, Ballotable Presentation: Vertex Exam by:: ginger morris rn  NST:  Baseline: 135 bpm, Variability: Good {> 6 bpm), Accelerations: Reactive and  Decelerations: Absent   Labs: No results found for this or any previous visit (from the past 24 hour(s)).  Imaging:  No results found.  MAU Course: Orders Placed This Encounter  Procedures  . CBC  . Comprehensive metabolic panel  . Protein / creatinine ratio, urine   No orders of the defined types were placed in this encounter.   MDM: Cervix unchanged after 1+ hour of monitoring. Patient requesting pain medication & states she would like to go home. Patient refused all medications offered to her (nubain, percocet, tylenol).  Discussed elevated BPs & ?history of chronic hypertension. Recommend blood work to r/o preeclampsia. Patient initially agreeable to labs, then requested to see administration to lodge complaints against [place] for her prenatal care & hospital visits because "we don't do anything for her". She then declined blood work & took off the BP cuff & monitor.  Had extensive discussion with patient regarding her BPs. Patient adamant that her BP is only elevated b/c she is in pain & because we won't "do anything for her". Discussed her increased risk for preeclampsia, dangers of preeclampsia & purpose for evaluation. Patient now agrees to blood work.  Dr. Alysia Penna aware of patient.   MAU management in to see patient.  Patient left AMA after speaking with management but refused to sign AMA paperwork. PEC labs collected prior to her leaving.     Assessment: 1. Left against medical advice   2. False labor   3. Chronic hypertension with exacerbation during pregnancy in third trimester     Plan: Patient signed out AMA  [name], NP 12/27/2018 9:12 AM

## 2018-12-28 ENCOUNTER — Other Ambulatory Visit: Payer: Self-pay

## 2018-12-28 ENCOUNTER — Inpatient Hospital Stay (HOSPITAL_COMMUNITY): Payer: Medicaid Other | Admitting: Anesthesiology

## 2018-12-28 ENCOUNTER — Encounter (HOSPITAL_COMMUNITY): Payer: Self-pay | Admitting: *Deleted

## 2018-12-28 ENCOUNTER — Inpatient Hospital Stay (HOSPITAL_COMMUNITY)
Admission: AD | Admit: 2018-12-28 | Discharge: 2018-12-30 | DRG: 806 | Disposition: A | Discharge status: Home / Self Care | Payer: Medicaid Other | Attending: Obstetrics and Gynecology | Admitting: Obstetrics and Gynecology

## 2018-12-28 DIAGNOSIS — D649 Anemia, unspecified: Secondary | ICD-10-CM | POA: Diagnosis present

## 2018-12-28 DIAGNOSIS — O9A213 Injury, poisoning and certain other consequences of external causes complicating pregnancy, third trimester: Secondary | ICD-10-CM

## 2018-12-28 DIAGNOSIS — Z3A37 37 weeks gestation of pregnancy: Secondary | ICD-10-CM | POA: Diagnosis not present

## 2018-12-28 DIAGNOSIS — O139 Gestational [pregnancy-induced] hypertension without significant proteinuria, unspecified trimester: Secondary | ICD-10-CM | POA: Diagnosis present

## 2018-12-28 DIAGNOSIS — O10913 Unspecified pre-existing hypertension complicating pregnancy, third trimester: Secondary | ICD-10-CM | POA: Diagnosis not present

## 2018-12-28 DIAGNOSIS — O10919 Unspecified pre-existing hypertension complicating pregnancy, unspecified trimester: Secondary | ICD-10-CM

## 2018-12-28 DIAGNOSIS — O9902 Anemia complicating childbirth: Secondary | ICD-10-CM | POA: Diagnosis present

## 2018-12-28 DIAGNOSIS — Z3009 Encounter for other general counseling and advice on contraception: Secondary | ICD-10-CM | POA: Diagnosis present

## 2018-12-28 DIAGNOSIS — O26873 Cervical shortening, third trimester: Secondary | ICD-10-CM | POA: Diagnosis present

## 2018-12-28 DIAGNOSIS — O0990 Supervision of high risk pregnancy, unspecified, unspecified trimester: Secondary | ICD-10-CM

## 2018-12-28 DIAGNOSIS — O99019 Anemia complicating pregnancy, unspecified trimester: Secondary | ICD-10-CM | POA: Diagnosis present

## 2018-12-28 DIAGNOSIS — O0932 Supervision of pregnancy with insufficient antenatal care, second trimester: Secondary | ICD-10-CM

## 2018-12-28 DIAGNOSIS — O4692 Antepartum hemorrhage, unspecified, second trimester: Secondary | ICD-10-CM

## 2018-12-28 DIAGNOSIS — O134 Gestational [pregnancy-induced] hypertension without significant proteinuria, complicating childbirth: Secondary | ICD-10-CM | POA: Diagnosis not present

## 2018-12-28 DIAGNOSIS — O26879 Cervical shortening, unspecified trimester: Secondary | ICD-10-CM

## 2018-12-28 DIAGNOSIS — O09529 Supervision of elderly multigravida, unspecified trimester: Secondary | ICD-10-CM

## 2018-12-28 DIAGNOSIS — Z3A36 36 weeks gestation of pregnancy: Secondary | ICD-10-CM | POA: Diagnosis not present

## 2018-12-28 DIAGNOSIS — Z91199 Patient's noncompliance with other medical treatment and regimen due to unspecified reason: Secondary | ICD-10-CM

## 2018-12-28 DIAGNOSIS — Z9189 Other specified personal risk factors, not elsewhere classified: Secondary | ICD-10-CM

## 2018-12-28 DIAGNOSIS — Z9119 Patient's noncompliance with other medical treatment and regimen: Secondary | ICD-10-CM

## 2018-12-28 LAB — CBC
HCT: 28 % — ABNORMAL LOW (ref 36.0–46.0)
HEMATOCRIT: 28.6 % — AB (ref 36.0–46.0)
HEMOGLOBIN: 8.7 g/dL — AB (ref 12.0–15.0)
Hemoglobin: 8.9 g/dL — ABNORMAL LOW (ref 12.0–15.0)
MCH: 26.5 pg (ref 26.0–34.0)
MCH: 27.3 pg (ref 26.0–34.0)
MCHC: 30.4 g/dL (ref 30.0–36.0)
MCHC: 31.8 g/dL (ref 30.0–36.0)
MCV: 87.2 fL (ref 80.0–100.0)
MCV: 85.9 fL (ref 80.0–100.0)
Platelets: 237 10*3/uL (ref 150–400)
Platelets: 216 10*3/uL (ref 150–400)
RBC: 3.28 MIL/uL — ABNORMAL LOW (ref 3.87–5.11)
RBC: 3.26 MIL/uL — AB (ref 3.87–5.11)
RDW: 13.5 % (ref 11.5–15.5)
RDW: 13.5 % (ref 11.5–15.5)
WBC: 11.3 10*3/uL — AB (ref 4.0–10.5)
WBC: 16.1 10*3/uL — ABNORMAL HIGH (ref 4.0–10.5)
nRBC: 0.4 % — ABNORMAL HIGH (ref 0.0–0.2)
nRBC: 0.1 % (ref 0.0–0.2)

## 2018-12-28 LAB — COMPREHENSIVE METABOLIC PANEL
ALT: 15 U/L (ref 0–44)
AST: 37 U/L (ref 15–41)
Albumin: 2.9 g/dL — ABNORMAL LOW (ref 3.5–5.0)
Alkaline Phosphatase: 83 U/L (ref 38–126)
Anion gap: 9 (ref 5–15)
BUN: 10 mg/dL (ref 6–20)
CO2: 18 mmol/L — ABNORMAL LOW (ref 22–32)
Calcium: 8.8 mg/dL — ABNORMAL LOW (ref 8.9–10.3)
Chloride: 104 mmol/L (ref 98–111)
Creatinine, Ser: 0.61 mg/dL (ref 0.44–1.00)
GFR calc Af Amer: >60 mL/min (ref >60)
GFR calc non Af Amer: >60 mL/min (ref >60)
Glucose, Bld: 71 mg/dL (ref 70–99)
Potassium: 5.5 mmol/L — ABNORMAL HIGH (ref 3.5–5.1)
Sodium: 131 mmol/L — ABNORMAL LOW (ref 135–145)
Total Bilirubin: 1.6 mg/dL — ABNORMAL HIGH (ref 0.3–1.2)
Total Protein: 5.5 g/dL — ABNORMAL LOW (ref 6.5–8.1)

## 2018-12-28 LAB — WET PREP, GENITAL
CLUE CELLS WET PREP: NONE SEEN
Sperm: NONE SEEN
Trich, Wet Prep: NONE SEEN
Yeast Wet Prep HPF POC: NONE SEEN

## 2018-12-28 LAB — TYPE AND SCREEN
ABO/RH(D): B POS
ANTIBODY SCREEN: NEGATIVE

## 2018-12-28 LAB — POCT FERN TEST: POCT Fern Test: NEGATIVE

## 2018-12-28 MED ORDER — COCONUT OIL OIL: 1.0000 "application " | TOPICAL_OIL | Status: DC | PRN

## 2018-12-28 MED ORDER — TETANUS-DIPHTH-ACELL PERTUSSIS 5-2.5-18.5 LF-MCG/0.5 IM SUSP: 0.5000 mL | Freq: Once | INTRAMUSCULAR | Status: DC

## 2018-12-28 MED ORDER — DIBUCAINE 1 % RE OINT
1.0000 "application " | TOPICAL_OINTMENT | RECTAL | Status: DC | PRN
  Administered 2018-12-29: 1 via RECTAL
  Filled 2018-12-28: qty 28

## 2018-12-28 MED ORDER — ZOLPIDEM TARTRATE 5 MG PO TABS: 5.0000 mg | ORAL_TABLET | Freq: Every evening | ORAL | Status: DC | PRN

## 2018-12-28 MED ORDER — FERROUS FUMARATE 324 (106 FE) MG PO TABS: 1.0000 | ORAL_TABLET | Freq: Two times a day (BID) | ORAL | Status: DC

## 2018-12-28 MED ORDER — OXYCODONE HCL 5 MG PO TABS: 5.0000 mg | ORAL_TABLET | ORAL | Status: DC | PRN

## 2018-12-28 MED ORDER — IBUPROFEN 600 MG PO TABS
600.0000 mg | ORAL_TABLET | Freq: Four times a day (QID) | ORAL | Status: DC
  Filled 2018-12-28 (×2): qty 1

## 2018-12-28 MED ORDER — FENTANYL-BUPIVACAINE-NACL 0.5-0.125-0.9 MG/250ML-% EP SOLN
12.0000 mL/h | EPIDURAL | Status: DC | PRN
  Filled 2018-12-28: qty 250

## 2018-12-28 MED ORDER — LACTATED RINGERS IV SOLN
500.0000 mL | Freq: Once | INTRAVENOUS | Status: AC
  Administered 2018-12-28: 500 mL via INTRAVENOUS

## 2018-12-28 MED ORDER — ACETAMINOPHEN 325 MG PO TABS: 650.0000 mg | ORAL_TABLET | ORAL | Status: DC | PRN

## 2018-12-28 MED ORDER — PHENYLEPHRINE 40 MCG/ML (10ML) SYRINGE FOR IV PUSH (FOR BLOOD PRESSURE SUPPORT): 80.0000 ug | PREFILLED_SYRINGE | INTRAVENOUS | Status: DC | PRN

## 2018-12-28 MED ORDER — BENZOCAINE-MENTHOL 20-0.5 % EX AERO
1.0000 "application " | INHALATION_SPRAY | CUTANEOUS | Status: DC | PRN
  Administered 2018-12-29: 1 via TOPICAL
  Filled 2018-12-28: qty 56

## 2018-12-28 MED ORDER — PRENATAL MULTIVITAMIN CH: 1.0000 | ORAL_TABLET | Freq: Every day | ORAL | Status: DC

## 2018-12-28 MED ORDER — EPHEDRINE 5 MG/ML INJ: 10.0000 mg | INTRAVENOUS | Status: DC | PRN

## 2018-12-28 MED ORDER — ONDANSETRON HCL 4 MG/2ML IJ SOLN: 4.0000 mg | Freq: Four times a day (QID) | INTRAMUSCULAR | Status: DC | PRN

## 2018-12-28 MED ORDER — ONDANSETRON HCL 4 MG PO TABS: 4.0000 mg | ORAL_TABLET | ORAL | Status: DC | PRN

## 2018-12-28 MED ORDER — ONDANSETRON HCL 4 MG/2ML IJ SOLN: 4.0000 mg | INTRAMUSCULAR | Status: DC | PRN

## 2018-12-28 MED ORDER — DIPHENHYDRAMINE HCL 50 MG/ML IJ SOLN: 12.5000 mg | INTRAMUSCULAR | Status: DC | PRN

## 2018-12-28 MED ORDER — TERBUTALINE SULFATE 1 MG/ML IJ SOLN: 0.2500 mg | Freq: Once | INTRAMUSCULAR | Status: DC | PRN

## 2018-12-28 MED ORDER — LACTATED RINGERS IV SOLN
INTRAVENOUS | Status: DC
  Administered 2018-12-28 (×2): via INTRAVENOUS

## 2018-12-28 MED ORDER — DIPHENHYDRAMINE HCL 25 MG PO CAPS: 25.0000 mg | ORAL_CAPSULE | Freq: Four times a day (QID) | ORAL | Status: DC | PRN

## 2018-12-28 MED ORDER — SODIUM CHLORIDE (PF) 0.9 % IJ SOLN
INTRAMUSCULAR | Status: DC | PRN
  Administered 2018-12-28: 12 mL/h via EPIDURAL

## 2018-12-28 MED ORDER — OXYCODONE-ACETAMINOPHEN 5-325 MG PO TABS: 2.0000 | ORAL_TABLET | ORAL | Status: DC | PRN

## 2018-12-28 MED ORDER — LIDOCAINE HCL (PF) 1 % IJ SOLN: 30.0000 mL | INTRAMUSCULAR | Status: DC | PRN

## 2018-12-28 MED ORDER — SOD CITRATE-CITRIC ACID 500-334 MG/5ML PO SOLN: 30.0000 mL | ORAL | Status: DC | PRN

## 2018-12-28 MED ORDER — LACTATED RINGERS IV SOLN: 500.0000 mL | INTRAVENOUS | Status: DC | PRN

## 2018-12-28 MED ORDER — OXYCODONE-ACETAMINOPHEN 5-325 MG PO TABS: 1.0000 | ORAL_TABLET | ORAL | Status: DC | PRN

## 2018-12-28 MED ORDER — OXYTOCIN BOLUS FROM INFUSION: 500.0000 mL | Freq: Once | INTRAVENOUS | Status: DC

## 2018-12-28 MED ORDER — OXYTOCIN 40 UNITS IN NORMAL SALINE INFUSION - SIMPLE MED: 2.5000 [IU]/h | INTRAVENOUS | Status: DC

## 2018-12-28 MED ORDER — WITCH HAZEL-GLYCERIN EX PADS
1.0000 "application " | MEDICATED_PAD | CUTANEOUS | Status: DC | PRN
  Administered 2018-12-29: 1 via TOPICAL

## 2018-12-28 MED ORDER — LIDOCAINE HCL (PF) 1 % IJ SOLN
INTRAMUSCULAR | Status: DC | PRN
  Administered 2018-12-28: 11 mL via EPIDURAL

## 2018-12-28 MED ORDER — SIMETHICONE 80 MG PO CHEW: 80.0000 mg | CHEWABLE_TABLET | ORAL | Status: DC | PRN

## 2018-12-28 MED ORDER — OXYTOCIN 40 UNITS IN NORMAL SALINE INFUSION - SIMPLE MED
1.0000 m[IU]/min | INTRAVENOUS | Status: DC
  Administered 2018-12-28: 2 m[IU]/min via INTRAVENOUS
  Filled 2018-12-28: qty 1000

## 2018-12-28 MED ORDER — SENNOSIDES-DOCUSATE SODIUM 8.6-50 MG PO TABS
2.0000 | ORAL_TABLET | ORAL | Status: DC
  Administered 2018-12-29 – 2018-12-30 (×2): 2 via ORAL
  Filled 2018-12-28 (×2): qty 2

## 2018-12-28 NOTE — Progress Notes (Addendum)
[facility]: Labor Progress Note  Subjective: Feeling comfortable with epidural but can still sense her contractions. FOB at bedside for support.   Objective: BP 133/81   Pulse 87   Temp 98.4 F (36.9 C) (Oral)   Resp 18   LMP 04/11/2018 (Within Days)  Gen: well appearing, no distress Dilation: 7.5 Effacement (%): 70 Station: -1, -2 Presentation: Vertex Exam by:: Dreana Britz  Assessment and Plan: 38 y.o. [id number] [redacted]w[redacted]d here for IOL for gHTN with anemia, Hg of 8.7. Continuing to monitor blood pressure, no severe range pressures since admission.   Labor: AROM with clear fluid at 1422, expectant management -- pain control: epidural in place  Fetal Well-Being: EFW 7lbs by leopolds. Cephalic by exam.  -- Category 1 - continuous fetal monitoring  -- GBS negative    Burman Nieves, MD Family Medicine Resident 2:39 PM

## 2018-12-28 NOTE — MAU Provider Note (Addendum)
Patient Kathy Dean is a 38 y.o. [phone] At [redacted]w[redacted]d here after being told to come back for a BP check. She endorses some contractions, some increased discharge. Denies VB, decreased fetal movements. She is a chronic hypertensive, although per  Dr. Alysia Penna and Dr. Shawnie Pons, she has not been diagnosed with chronic hypertension.  History     CSN: [id number]  Arrival date and time: 12/28/18 5830   First Provider Initiated Contact with Patient 12/28/18 [phone]      Chief Complaint  Patient presents with  . Hypertension  . Contractions   HPI Review of chart shows that patient had MAU visit yesterday on 12-27-2018; she was upset and left AMA. Pre-E labs were obtained and were negative. Patient showed me on her phone the My Chart messages between her and Dr. Jolayne Panther; I was unable to read the entirety of the message but patient says that she was told to come back in today to repeat blood pressure check.   Patient does not have new vagina discharge today; patient is concerned that her water has broken. She denies gushes of fluid.   Patient also has a headache 5/10; she does not want anything for it.  OB History    Gravida  8   Para  4   Term  4   Preterm  0   AB  3   Living  4     SAB  1   TAB  2   Ectopic      Multiple      Live Births  4           Past Medical History:  Diagnosis Date  . Abnormal Pap smear    rpt was ok  . Anemia   . Chronic hypertension   . Headache(784.0)   . History of blood transfusion   . Hyperemesis gravidarum   . Pregnancy induced hypertension   . Preterm labor   . Vaginal Pap smear, abnormal     Past Surgical History:  Procedure Laterality Date  . INDUCED ABORTION    . NO PAST SURGERIES      Family History  Problem Relation Age of Onset  . Stroke Maternal Grandmother   . Cancer Maternal Grandfather   . Hypertension Mother   . Diabetes Mother   . Hypertension Father   . Other Neg Hx     Social History   Tobacco Use  . Smoking  status: Never Smoker  . Smokeless tobacco: Never Used  Substance Use Topics  . Alcohol use: Not Currently    Alcohol/week: 21.0 standard drinks    Types: 21 Standard drinks or equivalent per week    Comment: not since pregnancy  . Drug use: Not on file    Comment: last used 1.5 weeks ago / uses CBD oil    Allergies:  Allergies  Allergen Reactions  . Flagyl [Metronidazole] Swelling and Dermatitis    Patient states can take gel form.   . Latex Swelling and Dermatitis    Medications Prior to Admission  Medication Sig Dispense Refill Last Dose  . feeding supplement (BOOST HIGH PROTEIN) LIQD Take 237 mLs by mouth 3 (three) times daily between meals. 30 Can 6 Unknown at Unknown time  . OVER THE COUNTER MEDICATION    12/23/2018 at Unknown time  . progesterone (PROMETRIUM) 200 MG capsule Place one capsule vaginally at bedtime 30 capsule 3 Unknown at Unknown time    Review of Systems  Constitutional: Negative.   HENT:  Negative.   Eyes: Negative.   Respiratory: Negative.   Cardiovascular: Negative.   Gastrointestinal: Negative.    Physical Exam   Blood pressure 139/86, pulse 95, temperature 98.4 F (36.9 C), temperature source Oral, resp. rate 18, last menstrual period 04/11/2018.  Physical Exam  Constitutional: She is oriented to person, place, and time. She appears well-developed and well-nourished.  HENT:  Head: Normocephalic.  Neck: Normal range of motion.  GI: Soft.  Genitourinary:    Genitourinary Comments: NEFG: copious white discharge in the vagina, no blood or amniotic fluid. Negative pooling. Cervix is 3-4, soft, anterior.    Neurological: She is alert and oriented to person, place, and time.  Skin: Skin is warm and dry.    MAU Course  Procedures  MDM -NST: 135; contractions q 4 min; mod var, present acel, neg decels, and  -fern slide: negative  -wet prep: pending -repeat GBS Patient adamant that her GBS was collected incorrectly at her last visit. She is  negative GBS with this pregnant but insists that it was collected wrong.  Patient states that she is not planning to come to any more prenatal visits.  Tylenol offered for her headache; patient declines and states that she will use CBD tea.  Patient Vitals for the past 24 hrs:  BP Temp Temp src Pulse Resp  12/28/18 0930 136/84 - - 91 -  12/28/18 0915 139/86 - - 95 -  12/28/18 0900 131/83 - - 93 -  12/28/18 0845 123/90 - - (!) 107 -  12/28/18 0842 - 98.4 F (36.9 C) Oral - 18    Assessment and Plan  -Given two elevated blood pressures, and given that patient has questionable chronic hypertensive,  she may now have gestational hypertension instead. Per Dr. Alysia Penna, patient to be admitted. She also has a headache 5/10, concerning for pre-e.   Charlesetta Garibaldi Matson Welch 12/28/2018, 9:20 AM

## 2018-12-28 NOTE — Lactation Note (Signed)
This note was copied from a baby's chart. Lactation Consultation Note  Patient Name: Kathy Dean Today's Date: 12/28/2018 Reason for consult: Initial assessment;Early term 9-38.6wks  5 hours old early term female who is being exclusively BF by her mother, she's a P5 and experienced BF. She didn't BF her first and second child because she was a teenager back then, but she was able to BF her first child for 10 months and her last one for 13 months. Mom only experienced BF difficulties with her third baby (first baby breastfed) she self reported recurrent episodes of mastitis. She has a Hx of THC use during the pregnancy, still awaiting for toxicology report. Mom participated in the [facility] program at the [facility] and doesn't have a pump at home.  Mom is already familiar with hand expression and she's able to get drops when doing it. RN Fannie Knee let LC know that blood sugars are on the 40's she was very proactive and gave mom a hand pump. LC also encouraged mom to hand express to get the max output of colostrum and to do it prior feedings at the breast in order to prime those milk ducts for maximum output. Baby just finished nursing when entering the room, she was asleep on top of mom's chest doing STS. Asked mom to call for assistance when needed. Discussed feeding cues and normal newborn behavior.  Feeding plan:  1. Encouraged mom to feed baby STS 8-12 times/24 hours or sooner if feeding cues are present 2. Hand expression/pumping and spoon feeding was also strongly encouraged  BF brochure and feeding diary were reviewed. Mom reported all questions and concerns were answered, she's aware of LC services and will call PRN.  Maternal Data Formula Feeding for Exclusion: No Has patient been taught Hand Expression?: Yes Does the patient have breastfeeding experience prior to this delivery?: Yes  Feeding Feeding Type: Breast Fed  LATCH Score Latch: Repeated attempts needed to sustain latch, nipple held  in mouth throughout feeding, stimulation needed to elicit sucking reflex.(hand expressed mom)  Audible Swallowing: A few with stimulation(few sucks)  Type of Nipple: Everted at rest and after stimulation  Comfort (Breast/Nipple): Soft / non-tender  Hold (Positioning): Assistance needed to correctly position infant at breast and maintain latch.(raised head of bed)  LATCH Score: 7  Interventions Interventions: Breast feeding basics reviewed;Skin to skin  Lactation Tools Discussed/Used WIC Program: Yes   Consult Status Consult Status: Follow-up Date: 12/29/18 Follow-up type: In-patient    Forney Kleinpeter Venetia Constable 12/28/2018, 10:06 PM

## 2018-12-28 NOTE — MAU Note (Signed)
Patient was seen in MAU 12/27/18 for a labor check and was found to have HTN.  Patient left AMA that day and returned today stating she was told through [facility] to return for BP check.  Patient states she has been having ongoing contractions through whole pregnancy and thinks her elevated BP is related to pain.  Today, states she has a HA she rates 5/10.  Rates contraction pain 5-7/10.

## 2018-12-28 NOTE — Anesthesia Preprocedure Evaluation (Signed)
Anesthesia Evaluation  Patient identified by MRN, date of birth, ID band Patient awake    Reviewed: Allergy & Precautions, H&P , Patient's Chart, lab work & pertinent test results  Airway Mallampati: II  TM Distance: >3 FB Neck ROM: full    Dental   Pulmonary    breath sounds clear to auscultation       Cardiovascular hypertension,  Rhythm:regular Rate:Normal     Neuro/Psych    GI/Hepatic   Endo/Other    Renal/GU      Musculoskeletal   Abdominal   Peds  Hematology   Anesthesia Other Findings   Reproductive/Obstetrics (+) Pregnancy                             Anesthesia Physical  Anesthesia Plan  ASA: II  Anesthesia Plan: Epidural   Post-op Pain Management:    Induction:   PONV Risk Score and Plan:   Airway Management Planned:   Additional Equipment:   Intra-op Plan:   Post-operative Plan:   Informed Consent: I have reviewed the patients History and Physical, chart, labs and discussed the procedure including the risks, benefits and alternatives for the proposed anesthesia with the patient or authorized representative who has indicated his/her understanding and acceptance.       Plan Discussed with:   Anesthesia Plan Comments:         Anesthesia Quick Evaluation

## 2018-12-28 NOTE — Discharge Summary (Signed)
Postpartum Discharge Summary     Patient Name: Kathy Dean DOB: 06-03-1981 MRN: [id number]  Date of admission: 12/28/2018 Delivering Provider: Burman Nieves A   Date of discharge: 12/30/2018  Admitting diagnosis: 37wks hbp Intrauterine pregnancy: [redacted]w[redacted]d     Secondary diagnosis:  Active Problems:   Advanced maternal age in multigravida   Anemia in pregnancy   Short cervix affecting pregnancy   Unwanted fertility   Indication for care in labor or delivery   Gestational hypertension  Additional problems: none     Discharge diagnosis: Term Pregnancy Delivered, Gestational Hypertension and Anemia                                                                                                Post partum procedures: None  Augmentation: AROM and Pitocin  Complications: None  Hospital course:  Induction of Labor With Vaginal Delivery   38 y.o. yo [id number] at [redacted]w[redacted]d was admitted to the hospital 12/28/2018 for induction of labor.  Indication for induction: Gestational hypertension. She had an MAU visit on 3/26 with some elevated pressures but neg pre-e labs; on 3/27 she continued to have elevated BPs giving her a dx of gHTN and was admitted for IOL. Patient had an uncomplicated labor course requiring a max of 10mu/min of Pit with AROM to progress to delivery. Membrane Rupture Time/Date: 2:22 PM ,12/28/2018   Intrapartum Procedures: Episiotomy: None [1]                                         Lacerations:  None [1]  Patient had delivery of a Viable infant.  Information for the patient's newborn:  Rugenia, Hausauer [[id number]]  Delivery Method: Vaginal, Spontaneous(Filed from Delivery Summary)   12/28/2018  Details of delivery can be found in separate delivery note.  Patient had a routine postpartum course. Patient is discharged home 12/30/18.  Magnesium Sulfate recieved: No BMZ received: No  Physical exam  Vitals:   12/29/18 0900 12/29/18 1408 12/29/18 2140 12/30/18 0558  BP:  118/76 131/85 128/77 (!) 123/92  Pulse: 85 94 87 73  Resp: 18 17 16 18   Temp: 98.6 F (37 C) 98.6 F (37 C) 98.6 F (37 C) 98.4 F (36.9 C)  TempSrc: Oral Oral Oral Oral  SpO2: 99% 100% 100%    General: alert, cooperative and no distress Lochia: appropriate Uterine Fundus: firm Incision: N/A DVT Evaluation: No evidence of DVT seen on physical exam. Labs: Lab Results  Component Value Date   WBC 16.1 (H) 12/28/2018   HGB 8.9 (L) 12/28/2018   HCT 28.0 (L) 12/28/2018   MCV 85.9 12/28/2018   PLT 216 12/28/2018   CMP Latest Ref Rng & Units 12/28/2018  Glucose 70 - 99 mg/dL 71  BUN 6 - 20 mg/dL 10  Creatinine 6.96 - 2.95 mg/dL 2.84  Sodium 132 - 440 mmol/L 131(L)  Potassium 3.5 - 5.1 mmol/L 5.5(H)  Chloride 98 - 111 mmol/L 104  CO2 22 - 32 mmol/L 18(L)  Calcium  8.9 - 10.3 mg/dL 1.4(N)  Total Protein 6.5 - 8.1 g/dL 8.2(N)  Total Bilirubin 0.3 - 1.2 mg/dL 5.6(O)  Alkaline Phos 38 - 126 U/L 83  AST 15 - 41 U/L 37  ALT 0 - 44 U/L 15    Discharge instruction: per After Visit Summary and "Baby and Me Booklet".  After visit meds:  Allergies as of 12/30/2018      Reactions   Flagyl [metronidazole] Swelling, Dermatitis   Patient states can take gel form.    Latex Swelling, Dermatitis      Medication List    STOP taking these medications   feeding supplement Liqd   progesterone 200 MG capsule Commonly known as:  PROMETRIUM     TAKE these medications   ibuprofen 600 MG tablet Commonly known as:  ADVIL,MOTRIN Take 1 tablet (600 mg total) by mouth every 6 (six) hours.   OVER THE COUNTER MEDICATION       Diet: low salt diet  Activity: Advance as tolerated. Pelvic rest for 6 weeks.   Outpatient follow up:4 weeks Follow up Appt:No future appointments. Follow up Visit:  Please schedule this patient for Postpartum visit in: 1 week (BP check), then 4wk PP visit with the following provider: Any provider For C/S patients schedule nurse incision check in weeks 2 weeks:  no High risk pregnancy complicated by: gest HTN Delivery mode:  SVD Anticipated Birth Control:  other/unsure PP Procedures needed: BP check in 1wk; maybe needs Pap? Schedule Integrated BH visit: no    Newborn Data: Live born female  Birth Weight:   APGAR: 9, 9  Newborn Delivery   Birth date/time:  12/28/2018 16:26:00 Delivery type:  Vaginal, Spontaneous     Baby Feeding: Breast Disposition:home with mother   12/30/2018 Gwenevere Abbot, MD

## 2018-12-28 NOTE — Anesthesia Procedure Notes (Signed)
Epidural Patient location during procedure: OB Start time: 12/28/2018 12:35 PM End time: 12/28/2018 12:50 PM  Staffing Anesthesiologist: Lowella Curb, MD Performed: anesthesiologist   Preanesthetic Checklist Completed: patient identified, site marked, surgical consent, pre-op evaluation, timeout performed, IV checked, risks and benefits discussed and monitors and equipment checked  Epidural Patient position: sitting Prep: ChloraPrep Patient monitoring: heart rate, cardiac monitor, continuous pulse ox and blood pressure Approach: midline Location: L2-L3 Injection technique: LOR saline  Needle:  Needle type: Tuohy  Needle gauge: 17 G Needle length: 9 cm Needle insertion depth: 4 cm Catheter type: closed end flexible Catheter size: 20 Guage Catheter at skin depth: 8 cm Test dose: negative  Assessment Events: blood not aspirated, injection not painful, no injection resistance, negative IV test and no paresthesia  Additional Notes Reason for block:procedure for pain

## 2018-12-28 NOTE — H&P (Signed)
OBSTETRIC ADMISSION HISTORY AND PHYSICAL  Kathy Dean is a 38 y.o. female [phone] with IUP at [redacted]w[redacted]d by 6wk U/S presenting for IOL due to onset of gHTN. Pt presented to [facility]U today for a BP check after being seen on 3/26 with elevated pressures and neg pre-e labs. BPs remain elevated today but less than severe range. Pt reports H/A as well- mild- and she would prefer not to take meds for it. Denies RUQ pain; has had floaters x 3 weeks; no diplopia.   Reports fetal movement. Denies vaginal bleeding.  She received her prenatal care at [facility].  Support person in labor: unsure at present  Ultrasounds . Anatomy U/S: [redacted]w[redacted]d, anatomy visualized, cx 1.5cm with cerclage recommended (declined) along with vag progesterone  Prenatal History/Complications: . AMA . Anemia in preg . Short cervix . Hx PTD . Desires BTL (papers signed 09/10/18) . gHTN  Past Medical History: Past Medical History:  Diagnosis Date  . Abnormal Pap smear    rpt was ok  . Anemia   . Chronic hypertension   . Headache(784.0)   . History of blood transfusion   . Hyperemesis gravidarum   . Pregnancy induced hypertension   . Preterm labor   . Vaginal Pap smear, abnormal     Past Surgical History: Past Surgical History:  Procedure Laterality Date  . INDUCED ABORTION    . NO PAST SURGERIES      Obstetrical History: OB History    Gravida  8   Para  4   Term  4   Preterm  0   AB  3   Living  4     SAB  1   TAB  2   Ectopic      Multiple      Live Births  4           Social History: Social History   Socioeconomic History  . Marital status: Single    Spouse name: Not on file  . Number of children: Not on file  . Years of education: Not on file  . Highest education level: Not on file  Occupational History  . Not on file  Social Needs  . Financial resource strain: Not on file  . Food insecurity:    Worry: Not on file    Inability: Not on file  . Transportation needs:    Medical: Not  on file    Non-medical: Not on file  Tobacco Use  . Smoking status: Never Smoker  . Smokeless tobacco: Never Used  Substance and Sexual Activity  . Alcohol use: Not Currently    Alcohol/week: 21.0 standard drinks    Types: 21 Standard drinks or equivalent per week    Comment: not since pregnancy  . Drug use: Not on file    Comment: last used 1.5 weeks ago / uses CBD oil  . Sexual activity: Yes    Birth control/protection: None  Lifestyle  . Physical activity:    Days per week: Not on file    Minutes per session: Not on file  . Stress: Not on file  Relationships  . Social connections:    Talks on phone: Not on file    Gets together: Not on file    Attends religious service: Not on file    Active member of club or organization: Not on file    Attends meetings of clubs or organizations: Not on file    Relationship status: Not on file  Other Topics  Concern  . Not on file  Social History Narrative  . Not on file    Family History: Family History  Problem Relation Age of Onset  . Stroke Maternal Grandmother   . Cancer Maternal Grandfather   . Hypertension Mother   . Diabetes Mother   . Hypertension Father   . Other Neg Hx     Allergies: Allergies  Allergen Reactions  . Flagyl [Metronidazole] Swelling and Dermatitis    Patient states can take gel form.   . Latex Swelling and Dermatitis    Medications Prior to Admission  Medication Sig Dispense Refill Last Dose  . feeding supplement (BOOST HIGH PROTEIN) LIQD Take 237 mLs by mouth 3 (three) times daily between meals. 30 Can 6 Unknown at Unknown time  . OVER THE COUNTER MEDICATION    12/23/2018 at Unknown time  . progesterone (PROMETRIUM) 200 MG capsule Place one capsule vaginally at bedtime 30 capsule 3 Unknown at Unknown time     Review of Systems  All systems reviewed and negative except as stated in HPI  Blood pressure 131/86, pulse 91, temperature 98.4 F (36.9 C), temperature source Oral, resp. rate 18, last  menstrual period 04/11/2018. General appearance: alert and cooperative Lungs: no respiratory distress Heart: regular rate  Abdomen: soft, non-tender; gravid  Pelvic: deferred; cx 3-4cm in MAU Extremities: Homans sign is negative, no sign of DVT Presentation: cephalic Fetal monitoring: 130s, +accels, no decels, Cat 1 Uterine activity: irreg ctx Dilation: 3.5 Exam by:: Paulina Fusi, CNM  Prenatal labs: ABO, Rh: --/--/B POS, B POS Performed at [facility], [address] [place], [place] [address]  [phone]/23 1756) Antibody: NEG (12/23 1756) Rubella: 7.90 (11/21 1614) RPR: Non Reactive (11/21 1614)  HBsAg: Negative (11/21 1614)  HIV: Non Reactive (11/21 1614)  GBS: Negative (03/18 0247)  Glucola: didn't do Genetic screening:  declined  Prenatal Transfer Tool  Maternal Diabetes: No- didn't do glucola Genetic Screening: Declined Maternal Ultrasounds/Referrals: Normal Fetal Ultrasounds or other Referrals:  None Maternal Substance Abuse:  No Significant Maternal Medications:  None Significant Maternal Lab Results: Lab values include: Group B Strep negative  Results for orders placed or performed during the hospital encounter of 12/28/18 (from the past 24 hour(s))  Wet prep, genital   Collection Time: 12/28/18  9:15 AM  Result Value Ref Range   Yeast Wet Prep HPF POC NONE SEEN NONE SEEN   Trich, Wet Prep NONE SEEN NONE SEEN   Clue Cells Wet Prep HPF POC NONE SEEN NONE SEEN   WBC, Wet Prep HPF POC MANY (A) NONE SEEN   Sperm NONE SEEN   Fern Test   Collection Time: 12/28/18  9:24 AM  Result Value Ref Range   POCT Fern Test Negative = intact amniotic membranes     Patient Active Problem List   Diagnosis Date Noted  . Traumatic injury during pregnancy in third trimester 12/02/2018  . Vaginal bleeding in pregnancy, second trimester 09/15/2018  . Unwanted fertility 09/10/2018  . Non-compliance 09/07/2018  . Short cervix affecting pregnancy 09/07/2018  . Anemia in  pregnancy 08/27/2018  . Supervision of high risk pregnancy, antepartum 08/23/2018  . Late prenatal care in second trimester 08/23/2018  . Chronic hypertension affecting pregnancy 08/23/2018  . Headache 08/23/2018  . Advanced maternal age in multigravida 08/23/2018  . Bacterial vaginitis 12/19/2017    Assessment/Plan:  Kathy Dean is a 38 y.o. [id number] at [redacted]w[redacted]d here for IOL due to gHTN (CBC/CMP neg for pre-e; urine P/C  ratio still pending); also with anemia of preg (Hgb 8.7)  Labor: IOL with Pitocin -- pain control: epidural  Fetal Wellbeing: EFW 7lbs by Leopold's. Cephalic by exam.  -- GBS (neg) -- continuous fetal monitoring - Cat 1   Postpartum Planning -- breast/had wanted a BTL, now unsure since elective procedures are currently postponed -- Rubella immune/[declined]Tdap   Burman Nieves, MD Family Medicine Resident   Arabella Merles CNM 12/28/2018 11:47 AM

## 2018-12-29 LAB — RPR: RPR Ser Ql: NONREACTIVE

## 2018-12-29 NOTE — Anesthesia Postprocedure Evaluation (Signed)
Anesthesia Post Note  Patient: Kathy Dean  Procedure(s) Performed: AN AD HOC LABOR EPIDURAL     Patient location during evaluation: Mother Baby Anesthesia Type: Epidural Level of consciousness: awake, awake and alert and oriented Pain management: pain level controlled Vital Signs Assessment: post-procedure vital signs reviewed and stable Respiratory status: spontaneous breathing and respiratory function stable Cardiovascular status: blood pressure returned to baseline Postop Assessment: no headache, no backache, epidural receding, patient able to bend at knees, no apparent nausea or vomiting, adequate PO intake and able to ambulate Anesthetic complications: no    Last Vitals:  Vitals:   12/29/18 0024 12/29/18 0506  BP: 125/87 108/71  Pulse: 76 73  Resp: 18 18  Temp: 36.4 C 37.1 C  SpO2:      Last Pain:  Vitals:   12/29/18 0506  TempSrc: Oral  PainSc:    Pain Goal: Patients Stated Pain Goal: 5 (12/28/18 0843)                 Cleda Clarks

## 2018-12-29 NOTE — Lactation Note (Addendum)
This note was copied from a baby's chart. Lactation Consultation Note  Patient Name: Kathy Dean [id number] Date: 12/29/2018 Reason for consult: Initial assessment;Early term 37-38.6wks P5, 25 hour female infant. Per mom, she BF her last two children for 10 and 13 months. She is an active [facility] participant in [place]. Infant had voids x 2 and stools x 3 since birth.  Mom latched infant on left breast using cross cradle hold, LC asked mom wait until infant's mouth is wide, tongue down and bring infant to breast, nose touching breast. Infant latched well and swallows heard by [facility], infant breastfeed for 20 minutes, mom break latched her nipples was round and smooth.  LC reinforced breastfeed infant according hunger cues, 8 or more times within 24 hours. LC discussed ways to wake sleepy baby at breast and STS while breastfeeding.  LC assisted mom in hand expression and mom gave infant 10 ml of EBM by curve tip syringe. Per mom,she attempted using hand pump but pump is painful to use, LC refitted mom with 27 mm breast flange mom still did not like using the hand pump she prefers hand expression.  Mom decided she will hand express after breastfeeding her choice to give infant more volume. LC gave mom sheet that discusses supplementation with breastfeeding according infant age/ hours of life. Mom knows to call Nurse or LC if she has any further questions, concerns or need assistance with latching infant to breast.   Maternal Data    Feeding    LATCH Score Latch: Grasps breast easily, tongue down, lips flanged, rhythmical sucking.  Audible Swallowing: Spontaneous and intermittent  Type of Nipple: Everted at rest and after stimulation  Comfort (Breast/Nipple): Soft / non-tender  Hold (Positioning): Assistance needed to correctly position infant at breast and maintain latch.  LATCH Score: 9  Interventions Interventions: Assisted with latch;Adjust position;Support pillows;Hand  express;Skin to skin  Lactation Tools Discussed/Used     Consult Status Consult Status: Follow-up Date: 12/30/18 Follow-up type: In-patient    Danelle Earthly 12/29/2018, 6:05 PM

## 2018-12-29 NOTE — Progress Notes (Signed)
Post Partum Day #1 Subjective: no complaints, up ad lib and tolerating PO; breastfeeding going well; denies dizziness with ambulation; unsure re contraception as was not able to get BTL; prefers chlorophyll to take at home as iron supplement  Objective: Blood pressure 108/71, pulse 73, temperature 98.7 F (37.1 C), temperature source Oral, resp. rate 18, last menstrual period 04/11/2018, SpO2 100 %, unknown if currently breastfeeding.  Physical Exam:  General: alert, cooperative and no distress Lochia: appropriate Uterine Fundus: firm DVT Evaluation: No evidence of DVT seen on physical exam.  Recent Labs    12/28/18 1047 12/28/18 1750  HGB 8.7* 8.9*  HCT 28.6* 28.0*    Assessment/Plan: Plan for discharge tomorrow   LOS: 1 day   Arabella Merles CNM 12/29/2018, 6:29 AM

## 2018-12-29 NOTE — Clinical Social Work Maternal (Signed)
CLINICAL SOCIAL WORK MATERNAL/CHILD NOTE  Patient Details  Name: Kathy Dean MRN: [id number] Date of Birth: 02/19/1981  Date:  12/29/2018  Clinical Social Worker Initiating Note:  Jolan Upchurch, MSW, LCSW-A Date/Time: Initiated:  12/29/18/1213     Child's Name:  Kathy Dean   Biological Parents:  Mother, Father   Need for Interpreter:  None   Reason for Referral:  Current Substance Use/Substance Use During Pregnancy    Address:  [address] [place] [address]    Phone number:  [phone] (home)     Additional phone number:   Household Members/Support Persons (HM/SP):   Household Member/Support Person 1, Household Member/Support Person 2   HM/SP Name Relationship DOB or Age  HM/SP -1 Sevyn Stonehouse Child 4 years  HM/SP -2 Daughter 12 years    HM/SP -3        HM/SP -4        HM/SP -5        HM/SP -6        HM/SP -7        HM/SP -8          Natural Supports (not living in the home):  Children, Extended Family, Friends, Immediate Family   Professional Supports: None   Employment: Self-employed   Type of Work:     Education:  Some College   Homebound arranged:    Financial Resources:  Medicaid   Other Resources:  WIC   Cultural/Religious Considerations Which May Impact Care:  Holiness  Strengths:  Ability to meet basic needs , Home prepared for child , Pediatrician chosen   Psychotropic Medications:         Pediatrician:    [place] area  Pediatrician List:   [place] ([facility])  [place]    [place]    [place]    [place]    [place]      Pediatrician Fax Number:    Risk Factors/Current Problems:  None   Cognitive State:  Alert , Able to Concentrate    Mood/Affect:  Calm , Comfortable , Bright    CSW Assessment: CSW received consult for MOB due to history of marijuana use. CSW met with MOB and newborn Kathy at bedside to complete assessment. CSW inquired with MOB regarding her  marijuana use, MOB stated she only uses CBD oil and not marijuana. MOB stated she doesn't want "marijuana use" in her chart because she doesn't use marijuana. CSW educated MOB on hospital drug screening policies, MOB did not have questions or concerns. MOB reports using CBD oil regularly to help with various ailments. CSW informed MOB that infant's UDS was negative. CSW explained mandated reporting process. MOB denies any prior CPS involvement and states she has custody of all of her children. MOB reports that she receives WIC and Medicaid. MOB reports this is her 5th child, the older children are aged 22, 20, 12, and 4. MOB reports living alone with her children ages 12 and 4. MOB reports that FOB is involved, his name is Larry Haizlip. There was a car seat in room, MOB confirmed this is for Kathy. MOB reports knowledge of installation and use of seat. MOB reports having it installed at the local fire department. MOB reports that newborn will sleep in a bassinet at home. SIDS precautions were thoroughly reviewed. MOB reports that newborn will receive pediatric care at [facility] on [address]. MOB reports being self-employed and has some college credit. MOB is currently breastfeeding   and it is going well. MOB reports having all items at home needed for newborn care. MOB reports having personal transportation to use for discharge. MOB denies any concerns at this time.   CSW will monitor chart for cord results and will make report if necessary.  CSW Plan/Description:  No Further Intervention Required/No Barriers to Discharge, CSW Will Continue to Monitor Umbilical Cord Tissue Drug Screen Results and Make Report if Warranted    Corah Willeford L Kenlee Maler, LCSWA 12/29/2018, 12:24 PM   

## 2018-12-29 NOTE — Plan of Care (Signed)
  Problem: Pain Managment: Goal: General experience of comfort will improve Note:  Patient states she is comfortable and declines her scheduled ibuprofen. Patient states she will call for ibuprofen if she needs it for pain. Patient is comfortable with infant and self care and has little questions. Earl Gala, Linda Hedges [place]

## 2018-12-29 NOTE — Lactation Note (Signed)
This note was copied from a baby's chart. Lactation Consultation Note  Patient Name: Kathy Dean DCVUD'T Date: 12/29/2018   Baby 19 hours old.  [redacted]w[redacted]d.  Mother is experienced bf. Mother states breastfeeding has been going well but states that the last feeding baby has become more sleepy. She states two of her other children were earlier than this one.   Discussed the possibility of needing to start pumping in addition to breastfeeding. Mother made aware to call if sleepiness continues.  Reviewed waking techniques.  Feed on demand approximately 8-12 times per day q 3 hours.   LC will follow up.     Maternal Data    Feeding Feeding Type: Breast Fed  LATCH Score                   Interventions    Lactation Tools Discussed/Used     Consult Status      [name] 12/29/2018, 11:51 AM

## 2018-12-30 MED ORDER — IBUPROFEN 600 MG PO TABS: 600.0000 mg | ORAL_TABLET | Freq: Four times a day (QID) | ORAL | 0 refills | Status: DC

## 2019-01-18 ENCOUNTER — Ambulatory Visit: Payer: Medicaid Other | Admitting: Obstetrics & Gynecology

## 2019-01-18 ENCOUNTER — Other Ambulatory Visit: Payer: Self-pay

## 2019-01-29 ENCOUNTER — Ambulatory Visit: Payer: Medicaid Other | Admitting: Obstetrics & Gynecology

## 2019-01-30 ENCOUNTER — Ambulatory Visit: Payer: Medicaid Other

## 2019-03-01 ENCOUNTER — Ambulatory Visit (INDEPENDENT_AMBULATORY_CARE_PROVIDER_SITE_OTHER): Payer: Medicaid Other | Admitting: Advanced Practice Midwife

## 2019-03-01 ENCOUNTER — Other Ambulatory Visit: Payer: Self-pay

## 2019-03-01 ENCOUNTER — Encounter: Payer: Self-pay | Admitting: Advanced Practice Midwife

## 2019-03-01 ENCOUNTER — Other Ambulatory Visit (HOSPITAL_COMMUNITY)
Admission: RE | Admit: 2019-03-01 | Discharge: 2019-03-01 | Disposition: A | Discharge status: Home / Self Care | Payer: Medicaid Other | Source: Ambulatory Visit | Attending: Advanced Practice Midwife | Admitting: Advanced Practice Midwife

## 2019-03-01 VITALS — BP 158/111 | HR 69 | Ht 61.0 in | Wt 121.0 lb

## 2019-03-01 DIAGNOSIS — N898 Other specified noninflammatory disorders of vagina: Secondary | ICD-10-CM | POA: Insufficient documentation

## 2019-03-01 DIAGNOSIS — Z3202 Encounter for pregnancy test, result negative: Secondary | ICD-10-CM

## 2019-03-01 DIAGNOSIS — I10 Essential (primary) hypertension: Secondary | ICD-10-CM

## 2019-03-01 DIAGNOSIS — B9689 Other specified bacterial agents as the cause of diseases classified elsewhere: Secondary | ICD-10-CM

## 2019-03-01 DIAGNOSIS — B373 Candidiasis of vulva and vagina: Secondary | ICD-10-CM

## 2019-03-01 DIAGNOSIS — Z1389 Encounter for screening for other disorder: Secondary | ICD-10-CM

## 2019-03-01 DIAGNOSIS — Z124 Encounter for screening for malignant neoplasm of cervix: Secondary | ICD-10-CM

## 2019-03-01 DIAGNOSIS — B3731 Acute candidiasis of vulva and vagina: Secondary | ICD-10-CM

## 2019-03-01 DIAGNOSIS — Z7251 High risk heterosexual behavior: Secondary | ICD-10-CM

## 2019-03-01 DIAGNOSIS — Z3009 Encounter for other general counseling and advice on contraception: Secondary | ICD-10-CM

## 2019-03-01 LAB — POCT URINE PREGNANCY: Preg Test, Ur: NEGATIVE

## 2019-03-01 MED ORDER — FLUCONAZOLE 150 MG PO TABS: 150.0000 mg | ORAL_TABLET | Freq: Once | ORAL | 1 refills | Status: AC

## 2019-03-01 MED ORDER — NORETHINDRONE 0.35 MG PO TABS: 1.0000 | ORAL_TABLET | Freq: Every day | ORAL | 12 refills | Status: DC

## 2019-03-01 NOTE — Patient Instructions (Addendum)
Laparoscopic Tubal Ligation Laparoscopic tubal ligation is a procedure to close the fallopian tubes. This is done so that you cannot get pregnant. When the fallopian tubes are closed, the eggs that your ovaries release cannot enter the uterus, and sperm cannot reach the released eggs. A laparoscopic tubal ligation is sometimes called "getting your tubes tied." You should not have this procedure if you want to get pregnant someday or if you are unsure about having more children. Tell a health care provider about:  Any allergies you have.  All medicines you are taking, including vitamins, herbs, eye drops, creams, and over-the-counter medicines.  Any problems you or family members have had with anesthetic medicines.  Any blood disorders you have.  Any surgeries you have had.  Any medical conditions you have.  Whether you are pregnant or may be pregnant.  Any past pregnancies. What are the risks? Generally, this is a safe procedure. However, problems may occur, including:  Infection.  Bleeding.  Injury to surrounding organs.  Side effects from anesthetics.  Failure of the procedure. This procedure can increase your risk of a kind of pregnancy in which a fertilized egg attaches to the outside of the uterus (ectopic pregnancy). What happens before the procedure?  Ask your health care provider about: ? Changing or stopping your regular medicines. This is especially important if you are taking diabetes medicines or blood thinners. ? Taking medicines such as aspirin and ibuprofen. These medicines can thin your blood. Do not take these medicines before your procedure if your health care provider instructs you not to.  Follow instructions from your health care provider about eating and drinking restrictions.  Plan to have someone take you home after the procedure.  If you go home right after the procedure, plan to have someone with you for 24 hours. What happens during the  procedure?      You will be given one or more of the following: ? A medicine to help you relax (sedative). ? A medicine to numb the area (local anesthetic). ? A medicine to make you fall asleep (general anesthetic). ? A medicine that is injected into an area of your body to numb everything below the injection site (regional anesthetic).  An IV tube will be inserted into one of your veins. It will be used to give you medicines and fluids during the procedure.  Your bladder may be emptied with a small tube (catheter).  If you have been given a general anesthetic, a tube will be put down your throat to help you breathe.  Two small cuts (incisions) will be made in your lower abdomen and near your belly button.  Your abdomen will be inflated with a gas. This will let the surgeon see better and will give the surgeon room to work.  A thin, lighted tube (laparoscope) with a camera attached will be inserted into your abdomen through one of the incisions. Small instruments will be inserted through the other incision.  The fallopian tubes will be tied off, burned (cauterized), or blocked with a clip, ring, or clamp. A small portion in the center of each fallopian tube may be removed.  The gas will be released from the abdomen.  The incisions will be closed with stitches (sutures).  A bandage (dressing) will be placed over the incisions. The procedure may vary among health care providers and hospitals. What happens after the procedure?  Your blood pressure, heart rate, breathing rate, and blood oxygen level will be monitored often until   the medicines you were given have worn off.  You will be given medicine to help with pain, nausea, and vomiting as needed. This information is not intended to replace advice given to you by your health care provider. Make sure you discuss any questions you have with your health care provider. Document Released: 12/26/2000 Document Revised: 05/16/2017 Document  Reviewed: 08/30/2015 Elsevier Interactive Patient Education  2019 [employer].   Managing Your Hypertension Hypertension is commonly called high blood pressure. This is when the force of your blood pressing against the walls of your arteries is too strong. Arteries are blood vessels that carry blood from your heart throughout your body. Hypertension forces the heart to work harder to pump blood, and may cause the arteries to become narrow or stiff. Having untreated or uncontrolled hypertension can cause heart attack, stroke, kidney disease, and other problems. What are blood pressure readings? A blood pressure reading consists of a higher number over a lower number. Ideally, your blood pressure should be below 120/80. The first ("top") number is called the systolic pressure. It is a measure of the pressure in your arteries as your heart beats. The second ("bottom") number is called the diastolic pressure. It is a measure of the pressure in your arteries as the heart relaxes. What does my blood pressure reading mean? Blood pressure is classified into four stages. Based on your blood pressure reading, your health care provider may use the following stages to determine what type of treatment you need, if any. Systolic pressure and diastolic pressure are measured in a unit called mm Hg. Normal  Systolic pressure: below 120.  Diastolic pressure: below 80. Elevated  Systolic pressure: 120-129.  Diastolic pressure: below 80. Hypertension stage 1  Systolic pressure: 130-139.  Diastolic pressure: 80-89. Hypertension stage 2  Systolic pressure: 140 or above.  Diastolic pressure: 90 or above. What health risks are associated with hypertension? Managing your hypertension is an important responsibility. Uncontrolled hypertension can lead to:  A heart attack.  A stroke.  A weakened blood vessel (aneurysm).  Heart failure.  Kidney damage.  Eye damage.  Metabolic syndrome.  Memory  and concentration problems. What changes can I make to manage my hypertension? Hypertension can be managed by making lifestyle changes and possibly by taking medicines. Your health care provider will help you make a plan to bring your blood pressure within a normal range. Eating and drinking   Eat a diet that is high in fiber and potassium, and low in salt (sodium), added sugar, and fat. An example eating plan is called the DASH (Dietary Approaches to Stop Hypertension) diet. To eat this way: ? Eat plenty of fresh fruits and vegetables. Try to fill half of your plate at each meal with fruits and vegetables. ? Eat whole grains, such as whole wheat pasta, brown rice, or whole grain bread. Fill about one quarter of your plate with whole grains. ? Eat low-fat diary products. ? Avoid fatty cuts of meat, processed or cured meats, and poultry with skin. Fill about one quarter of your plate with lean proteins such as fish, chicken without skin, beans, eggs, and tofu. ? Avoid premade and processed foods. These tend to be higher in sodium, added sugar, and fat.  Reduce your daily sodium intake. Most people with hypertension should eat less than 1,500 mg of sodium a day.  Limit alcohol intake to no more than 1 drink a day for nonpregnant women and 2 drinks a day for men. One drink equals  12 oz of beer, 5 oz of wine, or 1 oz of hard liquor. Lifestyle  Work with your health care provider to maintain a healthy body weight, or to lose weight. Ask what an ideal weight is for you.  Get at least 30 minutes of exercise that causes your heart to beat faster (aerobic exercise) most days of the week. Activities may include walking, swimming, or biking.  Include exercise to strengthen your muscles (resistance exercise), such as weight lifting, as part of your weekly exercise routine. Try to do these types of exercises for 30 minutes at least 3 days a week.  Do not use any products that contain nicotine or tobacco,  such as cigarettes and e-cigarettes. If you need help quitting, ask your health care provider.  Control any long-term (chronic) conditions you have, such as high cholesterol or diabetes. Monitoring  Monitor your blood pressure at home as told by your health care provider. Your personal target blood pressure may vary depending on your medical conditions, your age, and other factors.  Have your blood pressure checked regularly, as often as told by your health care provider. Working with your health care provider  Review all the medicines you take with your health care provider because there may be side effects or interactions.  Talk with your health care provider about your diet, exercise habits, and other lifestyle factors that may be contributing to hypertension.  Visit your health care provider regularly. Your health care provider can help you create and adjust your plan for managing hypertension. Will I need medicine to control my blood pressure? Your health care provider may prescribe medicine if lifestyle changes are not enough to get your blood pressure under control, and if:  Your systolic blood pressure is 130 or higher.  Your diastolic blood pressure is 80 or higher. Take medicines only as told by your health care provider. Follow the directions carefully. Blood pressure medicines must be taken as prescribed. The medicine does not work as well when you skip doses. Skipping doses also puts you at risk for problems. Contact a health care provider if:  You think you are having a reaction to medicines you have taken.  You have repeated (recurrent) headaches.  You feel dizzy.  You have swelling in your ankles.  You have trouble with your vision. Get help right away if:  You develop a severe headache or confusion.  You have unusual weakness or numbness, or you feel faint.  You have severe pain in your chest or abdomen.  You vomit repeatedly.  You have trouble breathing.  Summary  Hypertension is when the force of blood pumping through your arteries is too strong. If this condition is not controlled, it may put you at risk for serious complications.  Your personal target blood pressure may vary depending on your medical conditions, your age, and other factors. For most people, a normal blood pressure is less than 120/80.  Hypertension is managed by lifestyle changes, medicines, or both. Lifestyle changes include weight loss, eating a healthy, low-sodium diet, exercising more, and limiting alcohol. This information is not intended to replace advice given to you by your health care provider. Make sure you discuss any questions you have with your health care provider. Document Released: 06/13/2012 Document Revised: 08/17/2016 Document Reviewed: 08/17/2016 Elsevier Interactive Patient Education  2019 [employer] Inc[employer]   DASH Eating Plan DASH stands for "Dietary Approaches to Stop Hypertension." The DASH eating plan is a healthy eating plan that has been shown to  reduce high blood pressure (hypertension). It may also reduce your risk for type 2 diabetes, heart disease, and stroke. The DASH eating plan may also help with weight loss. What are tips for following this plan?  General guidelines  Avoid eating more than 2,300 mg (milligrams) of salt (sodium) a day. If you have hypertension, you may need to reduce your sodium intake to 1,500 mg a day.  Limit alcohol intake to no more than 1 drink a day for nonpregnant women and 2 drinks a day for men. One drink equals 12 oz of beer, 5 oz of wine, or 1 oz of hard liquor.  Work with your health care provider to maintain a healthy body weight or to lose weight. Ask what an ideal weight is for you.  Get at least 30 minutes of exercise that causes your heart to beat faster (aerobic exercise) most days of the week. Activities may include walking, swimming, or biking.  Work with your health care provider or diet and nutrition  specialist (dietitian) to adjust your eating plan to your individual calorie needs. Reading food labels   Check food labels for the amount of sodium per serving. Choose foods with less than 5 percent of the Daily Value of sodium. Generally, foods with less than 300 mg of sodium per serving fit into this eating plan.  To find whole grains, look for the word "whole" as the first word in the ingredient list. Shopping  Buy products labeled as "low-sodium" or "no salt added."  Buy fresh foods. Avoid canned foods and premade or frozen meals. Cooking  Avoid adding salt when cooking. Use salt-free seasonings or herbs instead of table salt or sea salt. Check with your health care provider or pharmacist before using salt substitutes.  Do not fry foods. Cook foods using healthy methods such as baking, boiling, grilling, and broiling instead.  Cook with heart-healthy oils, such as olive, canola, soybean, or sunflower oil. Meal planning  Eat a balanced diet that includes: ? 5 or more servings of fruits and vegetables each day. At each meal, try to fill half of your plate with fruits and vegetables. ? Up to 6-8 servings of whole grains each day. ? Less than 6 oz of lean meat, poultry, or fish each day. A 3-oz serving of meat is about the same size as a deck of cards. One egg equals 1 oz. ? 2 servings of low-fat dairy each day. ? A serving of nuts, seeds, or beans 5 times each week. ? Heart-healthy fats. Healthy fats called Omega-3 fatty acids are found in foods such as flaxseeds and coldwater fish, like sardines, salmon, and mackerel.  Limit how much you eat of the following: ? Canned or prepackaged foods. ? Food that is high in trans fat, such as fried foods. ? Food that is high in saturated fat, such as fatty meat. ? Sweets, desserts, sugary drinks, and other foods with added sugar. ? Full-fat dairy products.  Do not salt foods before eating.  Try to eat at least 2 vegetarian meals each  week.  Eat more home-cooked food and less restaurant, buffet, and fast food.  When eating at a restaurant, ask that your food be prepared with less salt or no salt, if possible. What foods are recommended? The items listed may not be a complete list. Talk with your dietitian about what dietary choices are best for you. Grains Whole-grain or whole-wheat bread. Whole-grain or whole-wheat pasta. Brown rice. Orpah Cobb. Bulgur. Whole-grain and low-sodium  cereals. Pita bread. Low-fat, low-sodium crackers. Whole-wheat flour tortillas. Vegetables Fresh or frozen vegetables (raw, steamed, roasted, or grilled). Low-sodium or reduced-sodium tomato and vegetable juice. Low-sodium or reduced-sodium tomato sauce and tomato paste. Low-sodium or reduced-sodium canned vegetables. Fruits All fresh, dried, or frozen fruit. Canned fruit in natural juice (without added sugar). Meat and other protein foods Skinless chicken or [place]urkey. Ground chicken or [place]key. Pork with fat trimmed off. Fish and seafood. Egg whites. Dried beans, peas, or lentils. Unsalted nuts, nut butters, and seeds. Unsalted canned beans. Lean cuts of beef with fat trimmed off. Low-sodium, lean deli meat. Dairy Low-fat (1%) or fat-free (skim) milk. Fat-free, low-fat, or reduced-fat cheeses. Nonfat, low-sodium ricotta or cottage cheese. Low-fat or nonfat yogurt. Low-fat, low-sodium cheese. Fats and oils Soft margarine without trans fats. Vegetable oil. Low-fat, reduced-fat, or light mayonnaise and salad dressings (reduced-sodium). Canola, safflower, olive, soybean, and sunflower oils. Avocado. Seasoning and other foods Herbs. Spices. Seasoning mixes without salt. Unsalted popcorn and pretzels. Fat-free sweets. What foods are not recommended? The items listed may not be a complete list. Talk with your dietitian about what dietary choices are best for you. Grains Baked goods made with fat, such as croissants, muffins, or some breads. Dry pasta  or rice meal packs. Vegetables Creamed or fried vegetables. Vegetables in a cheese sauce. Regular canned vegetables (not low-sodium or reduced-sodium). Regular canned tomato sauce and paste (not low-sodium or reduced-sodium). Regular tomato and vegetable juice (not low-sodium or reduced-sodium). Rosita FirePickles. Olives. Fruits Canned fruit in a light or heavy syrup. Fried fruit. Fruit in cream or butter sauce. Meat and other protein foods Fatty cuts of meat. Ribs. Fried meat. Tomasa BlaseBacon. Sausage. Bologna and other processed lunch meats. Salami. Fatback. Hotdogs. Bratwurst. Salted nuts and seeds. Canned beans with added salt. Canned or smoked fish. Whole eggs or egg yolks. Chicken or [place]urkey with skin. Dairy Whole or 2% milk, cream, and half-and-half. Whole or full-fat cream cheese. Whole-fat or sweetened yogurt. Full-fat cheese. Nondairy creamers. Whipped toppings. Processed cheese and cheese spreads. Fats and oils Butter. Stick margarine. Lard. Shortening. Ghee. Bacon fat. Tropical oils, such as coconut, palm kernel, or palm oil. Seasoning and other foods Salted popcorn and pretzels. Onion salt, garlic salt, seasoned salt, table salt, and sea salt. Worcestershire sauce. Tartar sauce. Barbecue sauce. Teriyaki sauce. Soy sauce, including reduced-sodium. Steak sauce. Canned and packaged gravies. Fish sauce. Oyster sauce. Cocktail sauce. Horseradish that you find on the shelf. Ketchup. Mustard. Meat flavorings and tenderizers. Bouillon cubes. Hot sauce and Tabasco sauce. Premade or packaged marinades. Premade or packaged taco seasonings. Relishes. Regular salad dressings. Where to find more information:  National Heart, Lung, and Blood Institute: PopSteam.iswww.nhlbi.nih.gov  American Heart Association: www.heart.org Summary  The DASH eating plan is a healthy eating plan that has been shown to reduce high blood pressure (hypertension). It may also reduce your risk for type 2 diabetes, heart disease, and stroke.  With the  DASH eating plan, you should limit salt (sodium) intake to 2,300 mg a day. If you have hypertension, you may need to reduce your sodium intake to 1,500 mg a day.  When on the DASH eating plan, aim to eat more fresh fruits and vegetables, whole grains, lean proteins, low-fat dairy, and heart-healthy fats.  Work with your health care provider or diet and nutrition specialist (dietitian) to adjust your eating plan to your individual calorie needs. This information is not intended to replace advice given to you by your health care provider. Make sure you discuss any  questions you have with your health care provider. Document Released: 09/08/2011 Document Revised: 09/12/2016 Document Reviewed: 09/12/2016 Elsevier Interactive Patient Education  2019 [employer].

## 2019-03-01 NOTE — Progress Notes (Signed)
Post Partum Exam  Kathy Dean is a 38 y.o. [id number] female who presents for a postpartum visit. She is 9 weeks postpartum following a spontaneous vaginal delivery. I have fully reviewed the prenatal and intrapartum course. The delivery was at 37 gestational weeks.  Anesthesia: epidural. Postpartum course has been well. Baby's course has been well. Baby is feeding by breast. Bleeding pt notes orange vaginal discharge that is now starting to itch no odor. . Bowel function is normal. Bladder function is normal. Patient is sexually active. Contraception method is lactational amenorrhea. Postpartum depression screening:neg EPDS : 0   The following portions of the patient's history were reviewed and updated as appropriate: allergies, current medications, past family history, past medical history, past social history, past surgical history and problem list. Hx CHTN. States BP is usually well-controlled w/ CBD oil. Has not started back postpartum because her baby tested + for [facility] at delivery and she is waiting for CPS issues to be resolved before starting it again.   Last pap smear done at unknown and was Normal per pt.  Review of Systems Pertinent items are noted in HPI.    Objective:  Blood pressure (!) 158/111, pulse 69, height 5\' 1"  (1.549 m), weight 121 lb (54.9 kg), currently breastfeeding.  [id number] 143/104  General:  alert, cooperative, appears stated age and no distress   Breasts:  inspection negative, no nipple discharge or bleeding, no masses or nodularity palpable  Lungs: clear to auscultation bilaterally  Heart:  regular rate and rhythm, S1, S2 normal, no murmur, click, rub or gallop  Abdomen: soft, non-tender; bowel sounds normal; no masses,  no organomegaly   Vulva:  normal  Vagina: Small-mod amount of curdlike blood-tinged discahrge. No odor.   Cervix:  multiparous appearance, no bleeding following Pap and no cervical motion tenderness  Corpus: normal size, contour, position,  consistency, mobility, non-tender  Adnexa:  no mass, fullness, tenderness  Rectal Exam: Not performed.        Assessment:    Nml postpartum exam. Pap smear done at today's visit.   1. Screening for cervical cancer  - Cytology - PAP( [place])  2. Vaginal discharge - Wet Prep, GC/Ch;amydia  3. Chronic hypertension - Referred back to PCP. Discussed dangers of uncontrolled HTN.  - DASH diet  4. Vaginal yeast infection - Diflucan  5. Initiation of POPs - Rx Micronor.  - UPT  Plan:   1. Contraception: oral progesterone-only contraceptive 2. Follow up in: 1 year or as needed.

## 2019-03-04 LAB — CERVICOVAGINAL ANCILLARY ONLY
Bacterial vaginitis: POSITIVE — AB
Candida vaginitis: NEGATIVE
Chlamydia: NEGATIVE
Neisseria Gonorrhea: NEGATIVE
Trichomonas: NEGATIVE

## 2019-03-07 LAB — CYTOLOGY - PAP
Diagnosis: NEGATIVE
HPV 16/18/45 genotyping: NEGATIVE
HPV: DETECTED — AB

## 2019-03-07 MED ORDER — METRONIDAZOLE 0.75 % VA GEL: 1.0000 | Freq: Every day | VAGINAL | 1 refills | Status: DC

## 2019-03-07 NOTE — Addendum Note (Signed)
Addended by: Dorathy Kinsman on: 03/07/2019 04:55 PM   Modules accepted: Orders

## 2020-03-10 ENCOUNTER — Other Ambulatory Visit: Payer: Self-pay

## 2020-03-10 ENCOUNTER — Other Ambulatory Visit (HOSPITAL_COMMUNITY)
Admission: RE | Admit: 2020-03-10 | Discharge: 2020-03-10 | Disposition: A | Discharge status: Home / Self Care | Payer: Medicaid Other | Source: Ambulatory Visit | Attending: Student | Admitting: Student

## 2020-03-10 ENCOUNTER — Encounter: Payer: Self-pay | Admitting: Student

## 2020-03-10 ENCOUNTER — Ambulatory Visit: Payer: Medicaid Other | Admitting: Student

## 2020-03-10 VITALS — BP 155/101 | HR 82 | Wt 122.0 lb

## 2020-03-10 DIAGNOSIS — N898 Other specified noninflammatory disorders of vagina: Secondary | ICD-10-CM | POA: Diagnosis not present

## 2020-03-10 MED ORDER — FLUCONAZOLE 150 MG PO TABS: ORAL_TABLET | ORAL | 0 refills | Status: DC

## 2020-03-10 NOTE — Progress Notes (Signed)
RGYN present for problem visit   Pt notes having abortion in March   CC: pt notes vaginal discharge that is not normal occasional odor.   Pt notes getting period in April and End  May (ended May 29th)  pt notes cycles were irregular.  *Pt cannot do Flagyl states she can do Metro gel  * Taking [employer] oil as well.   * Not on any B/P Medication at this time.

## 2020-03-10 NOTE — Progress Notes (Addendum)
Patient ID: SAN RUA, female   DOB: 1981-01-07, 39 y.o.   MRN: [id number]  History:  Ms. Kathy Dean is a 39 y.o. [id number] who presents to clinic today for concern for bleeding and discharge after TAB in March. Patient states that since her TAB (surgical), she has had on/off again spotting. Sometimes dark brown, sometimes it has an odor. She thinks she also has had thin grey discharge between spotting and some yellow colored discharge. No itching. She said she went back to abortion facility at 1 week post-op with concern about her bleeding and they told her that this was normal. She said that they did not do any tests because she did not have any itching or discomfort at that time she was just concerned about the bleeding. Her bleeding tapered off and then she had her period at the end of May. Now she is concerned about her discharge and wants to be tested. Patient denies fever, abdominal tenderness, passing clots, body aches/chills or other signs of infection.   She has chronic hypertension not on medication. She does not want any birth control today.   Pap up to date, last pap normal in May 2020.   The following portions of the patient's history were reviewed and updated as appropriate: allergies, current medications, family history, past medical history, social history, past surgical history and problem list.  Review of Systems:  Review of Systems  Constitutional: Negative.   HENT: Negative.   Eyes: Negative.   Respiratory: Negative.   Cardiovascular: Negative.   Genitourinary: Negative.   Skin: Negative.   Neurological: Negative.       Objective:  Physical Exam BP (!) 155/101    Pulse 82    Wt 122 lb (55.3 kg)    BMI 23.05 kg/m  Physical Exam  Constitutional: She is oriented to person, place, and time. She appears well-developed.  HENT:  Head: Normocephalic.  Respiratory: Effort normal.  GI: Soft.  Genitourinary:    Vagina normal.     Genitourinary Comments: NEFG; trace  amount of blood in the vagina. Cervix is pink with no lesions, small amount of clumpy white discharge in the vagina, no odor.    Musculoskeletal:        General: Normal range of motion.     Cervical back: Normal range of motion.  Neurological: She is alert and oriented to person, place, and time.  Skin: Skin is warm and dry.  Psychiatric: She has a normal mood and affect.      Labs and Imaging No results found for this or any previous visit (from the past 24 hour(s)).  No results found.   Assessment & Plan:  1. Vaginal discharge -will treat for presumptive yeast, no other concerns. RX for Diflucan sent.  -will check for all vaginal infections, patient declines other testing.  -Patient will follow up with PCP - Cervicovaginal ancillary only( [place])    Approximately 20 minutes of face-to-face time was spent with this patient   Marylene Land, CNM 40/05/2020 8:50 PM

## 2020-03-11 ENCOUNTER — Other Ambulatory Visit: Payer: Self-pay

## 2020-03-11 ENCOUNTER — Encounter: Payer: Self-pay | Admitting: Student

## 2020-03-11 LAB — CERVICOVAGINAL ANCILLARY ONLY
Bacterial Vaginitis (gardnerella): POSITIVE — AB
Candida Glabrata: NEGATIVE
Candida Vaginitis: NEGATIVE
Chlamydia: NEGATIVE
Comment: NEGATIVE
Comment: NEGATIVE
Comment: NEGATIVE
Comment: NEGATIVE
Comment: NEGATIVE
Comment: NORMAL
Neisseria Gonorrhea: NEGATIVE
Trichomonas: NEGATIVE

## 2020-03-11 NOTE — Progress Notes (Signed)
error 

## 2020-03-13 ENCOUNTER — Other Ambulatory Visit: Payer: Self-pay | Admitting: Student

## 2020-03-13 ENCOUNTER — Other Ambulatory Visit: Payer: Self-pay | Admitting: *Deleted

## 2020-03-14 ENCOUNTER — Other Ambulatory Visit: Payer: Self-pay | Admitting: Student

## 2020-03-14 MED ORDER — CLINDAMYCIN HCL 300 MG PO CAPS: 300.0000 mg | ORAL_CAPSULE | Freq: Two times a day (BID) | ORAL | 0 refills | Status: AC

## 2020-03-17 ENCOUNTER — Other Ambulatory Visit: Payer: Self-pay | Admitting: Obstetrics

## 2020-03-17 MED ORDER — METRONIDAZOLE 0.75 % VA GEL: 1.0000 | Freq: Two times a day (BID) | VAGINAL | 0 refills | Status: DC

## 2020-05-07 DIAGNOSIS — I10 Essential (primary) hypertension: Secondary | ICD-10-CM | POA: Diagnosis not present

## 2020-05-07 DIAGNOSIS — Z79899 Other long term (current) drug therapy: Secondary | ICD-10-CM | POA: Diagnosis not present

## 2020-05-07 DIAGNOSIS — Z888 Allergy status to other drugs, medicaments and biological substances status: Secondary | ICD-10-CM | POA: Diagnosis not present

## 2020-05-07 DIAGNOSIS — N61 Mastitis without abscess: Secondary | ICD-10-CM | POA: Diagnosis not present

## 2020-05-07 DIAGNOSIS — Z91040 Latex allergy status: Secondary | ICD-10-CM | POA: Diagnosis not present

## 2020-05-07 DIAGNOSIS — R1031 Right lower quadrant pain: Secondary | ICD-10-CM | POA: Diagnosis not present

## 2020-05-28 ENCOUNTER — Inpatient Hospital Stay (HOSPITAL_COMMUNITY): Admit: 2020-05-28 | Payer: Medicaid Other

## 2020-05-28 ENCOUNTER — Encounter: Payer: Self-pay | Admitting: Obstetrics and Gynecology

## 2020-05-28 ENCOUNTER — Ambulatory Visit: Payer: Medicaid Other | Admitting: Obstetrics and Gynecology

## 2020-05-28 ENCOUNTER — Other Ambulatory Visit: Payer: Self-pay

## 2020-05-28 VITALS — BP 157/105 | HR 81 | Ht 62.0 in

## 2020-05-28 DIAGNOSIS — Z8759 Personal history of other complications of pregnancy, childbirth and the puerperium: Secondary | ICD-10-CM

## 2020-05-28 DIAGNOSIS — N912 Amenorrhea, unspecified: Secondary | ICD-10-CM | POA: Diagnosis not present

## 2020-05-28 DIAGNOSIS — Z124 Encounter for screening for malignant neoplasm of cervix: Secondary | ICD-10-CM

## 2020-05-28 LAB — POCT URINE PREGNANCY: Preg Test, Ur: NEGATIVE

## 2020-05-28 MED ORDER — ENALAPRIL MALEATE 5 MG PO TABS: 5.0000 mg | ORAL_TABLET | Freq: Every day | ORAL | 3 refills | Status: DC

## 2020-05-28 NOTE — Progress Notes (Signed)
Patient presents today wanting to discuss miscarriages.  She states that the one that she would like to discuss is the one from last month. She states that she can not remember when her previous miscarriages were. 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

## 2020-05-28 NOTE — Progress Notes (Signed)
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 Patient reports being diagnosed with a miscarriage last month. She has a history of 2 previous miscarriages in the past. Patient is doing well today and is without complaints. She is interested in contraception and no longer wants to conceive. Patient does not want to conceive in order to avoid having to go through another miscarriage. Patient with [facility] not currently on any medication.   Past Medical History:  Diagnosis Date   Abnormal Pap smear    rpt was ok   Anemia    Chronic hypertension    Headache(784.0)    History of blood transfusion    Hyperemesis gravidarum    Pregnancy induced hypertension    Preterm labor    Vaginal Pap smear, abnormal    Past Surgical History:  Procedure Laterality Date   INDUCED ABORTION     NO PAST SURGERIES     Family History  Problem Relation Age of Onset   Stroke Maternal Grandmother    Cancer Maternal Grandfather    Hypertension Mother    Diabetes Mother    Hypertension Father    Other Neg Hx    Social History   Tobacco Use   Smoking status: Never Smoker   Smokeless tobacco: Never Used  Vaping Use   Vaping Use: Never used  Substance Use Topics   Alcohol use: Not Currently    Alcohol/week: 21.0 standard drinks    Types: 21 Standard drinks or equivalent per week    Comment: occassionally   Drug use: Yes    Types: Other-see comments    Comment: last used 1.5 weeks ago / uses CBD oil   ROS See pertinent in HPI  Blood pressure (!) 157/105, pulse 81, height 5\' 2"  (1.575 m), last menstrual period 05/15/2020, currently breastfeeding. GENERAL: Well-developed, well-nourished female in no acute distress. Patient kept her eyes closed during the entire encounter HEENT: Normocephalic, atraumatic. Sclerae anicteric.  NECK: Supple. Normal thyroid.  LUNGS: Clear to auscultation bilaterally.  HEART: Regular rate and  rhythm. ABDOMEN: Soft, nontender, nondistended. No organomegaly. PELVIC: Normal external female genitalia. Vagina is pink and rugated.  Normal discharge. Normal appearing cervix. Uterus is normal in size. No adnexal mass or tenderness. EXTREMITIES: No cyanosis, clubbing, or edema, 2+ distal pulses.  A/P 39 yo with history of miscarriages here for evaluation - Pap smear collected - Discussed progesterone only forms of contraception given hypertension- Patient declined all options - Discussed BTL vas vasectomy as patient is not interested in future conception- patient declined - Rx enalapril provided and patient to follow up with PCP - Patient will be contacted with abnormal results - RTC prn

## 2020-06-03 LAB — CYTOLOGY - PAP: Adequacy: ABNORMAL

## 2020-06-15 ENCOUNTER — Ambulatory Visit: Payer: Medicaid Other | Admitting: Obstetrics and Gynecology

## 2020-07-06 ENCOUNTER — Ambulatory Visit: Payer: Medicaid Other | Admitting: Obstetrics and Gynecology

## 2020-07-28 ENCOUNTER — Ambulatory Visit: Payer: Medicaid Other | Admitting: Obstetrics and Gynecology

## 2020-07-29 DIAGNOSIS — Z91040 Latex allergy status: Secondary | ICD-10-CM | POA: Diagnosis not present

## 2020-07-29 DIAGNOSIS — M791 Myalgia, unspecified site: Secondary | ICD-10-CM | POA: Diagnosis not present

## 2020-07-29 DIAGNOSIS — I10 Essential (primary) hypertension: Secondary | ICD-10-CM | POA: Diagnosis not present

## 2020-07-29 DIAGNOSIS — Z883 Allergy status to other anti-infective agents status: Secondary | ICD-10-CM | POA: Diagnosis not present

## 2020-07-29 DIAGNOSIS — N939 Abnormal uterine and vaginal bleeding, unspecified: Secondary | ICD-10-CM | POA: Diagnosis not present

## 2020-08-03 ENCOUNTER — Ambulatory Visit (INDEPENDENT_AMBULATORY_CARE_PROVIDER_SITE_OTHER): Payer: Medicaid Other | Admitting: Obstetrics & Gynecology

## 2020-08-03 ENCOUNTER — Other Ambulatory Visit: Payer: Self-pay

## 2020-08-03 ENCOUNTER — Other Ambulatory Visit (HOSPITAL_COMMUNITY)
Admission: RE | Admit: 2020-08-03 | Discharge: 2020-08-03 | Disposition: A | Discharge status: Home / Self Care | Payer: Medicaid Other | Source: Ambulatory Visit | Attending: Obstetrics & Gynecology | Admitting: Obstetrics & Gynecology

## 2020-08-03 VITALS — BP 143/83 | HR 87 | Wt 115.0 lb

## 2020-08-03 DIAGNOSIS — R102 Pelvic and perineal pain: Secondary | ICD-10-CM | POA: Insufficient documentation

## 2020-08-03 DIAGNOSIS — N854 Malposition of uterus: Secondary | ICD-10-CM

## 2020-08-03 NOTE — Progress Notes (Signed)
Pt is in the office for repeat pap smear, reports pelvic pain when coughing, gray vaginal discharge. Pt also reports soreness in stomach related to cramping. Pt reports losing 10lbs in 3 weeks, decreased appetite and lack of energy.

## 2020-08-03 NOTE — Patient Instructions (Signed)
Miscarriage A miscarriage is the loss of an unborn baby (fetus) before the 20th week of pregnancy. Follow these instructions at home: Medicines   Take over-the-counter and prescription medicines only as told by your doctor.  If you were prescribed antibiotic medicine, take it as told by your doctor. Do not stop taking the antibiotic even if you start to feel better.  Do not take NSAIDs unless your doctor says that this is safe for you. NSAIDs include aspirin and ibuprofen. These medicines can cause bleeding. Activity  Rest as directed. Ask your doctor what activities are safe for you.  Have someone help you at home during this time. General instructions  Write down how many pads you use each day and how soaked they are.  Watch the amount of tissue or clumps of blood (blood clots) that you pass from your vagina. Save any large amounts of tissue for your doctor.  Do not use tampons, douche, or have sex until your doctor approves.  To help you and your partner with the process of grieving, talk with your doctor or seek counseling.  When you are ready, meet with your doctor to talk about steps you should take for your health. Also, talk with your doctor about steps to take to have a healthy pregnancy in the future.  Keep all follow-up visits as told by your doctor. This is important. Contact a doctor if:  You have a fever or chills.  You have vaginal discharge that smells bad.  You have more bleeding. Get help right away if:  You have very bad cramps or pain in your back or belly.  You pass clumps of blood that are walnut-sized or larger from your vagina.  You pass tissue that is walnut-sized or larger from your vagina.  You soak more than 1 regular pad in an hour.  You get light-headed or weak.  You faint (pass out).  You have feelings of sadness that do not go away, or you have thoughts of hurting yourself. Summary  A miscarriage is the loss of an unborn baby before  the 20th week of pregnancy.  Follow your doctor's instructions for home care. Keep all follow-up appointments.  To help you and your partner with the process of grieving, talk with your doctor or seek counseling. This information is not intended to replace advice given to you by your health care provider. Make sure you discuss any questions you have with your health care provider. Document Revised: 01/11/2019 Document Reviewed: 10/25/2016 Elsevier Patient Education  2020 [employer]vier Inc.  

## 2020-08-03 NOTE — Progress Notes (Signed)
RGYN patient presents for repeat pap today  Last pap: 05/28/20 : unsatisfactory

## 2020-08-03 NOTE — Progress Notes (Signed)
Patient ID: Kathy Dean, female   DOB: 24-Apr-1981, 39 y.o.   MRN: [id number]  Chief Complaint  Patient presents with  . Follow-up  pap needs repeat, occasional pelvic pain  HPI Kathy Dean is a 39 y.o. female.  [id number] Patient's last menstrual period was 07/27/2020. She states she had early Sab last 1-2 mo ago, had home pregnancy test only but typical sx. Still wants to conceive. Her menses last for 3 days with 1 day of cramps and heavier bleeding. HPI  Past Medical History:  Diagnosis Date  . Abnormal Pap smear    rpt was ok  . Anemia   . Chronic hypertension   . Headache(784.0)   . History of blood transfusion   . Hyperemesis gravidarum   . Pregnancy induced hypertension   . Preterm labor   . Vaginal Pap smear, abnormal     Past Surgical History:  Procedure Laterality Date  . INDUCED ABORTION    . NO PAST SURGERIES      Family History  Problem Relation Age of Onset  . Stroke Maternal Grandmother   . Cancer Maternal Grandfather   . Hypertension Mother   . Diabetes Mother   . Hypertension Father   . Other Neg Hx     Social History Social History   Tobacco Use  . Smoking status: Never Smoker  . Smokeless tobacco: Never Used  Vaping Use  . Vaping Use: Never used  Substance Use Topics  . Alcohol use: Not Currently    Alcohol/week: 21.0 standard drinks    Types: 21 Standard drinks or equivalent per week    Comment: occassionally  . Drug use: Yes    Types: Other-see comments    Comment: last used 1.5 weeks ago / uses CBD oil    Allergies  Allergen Reactions  . Flagyl [Metronidazole] Swelling and Dermatitis    Patient states can take gel form.   . Latex Swelling and Dermatitis    Current Outpatient Medications  Medication Sig Dispense Refill  . Omega-3 Fatty Acids (FISH OIL) 1000 MG CAPS Take by mouth.    Marland Kitchen OVER THE COUNTER MEDICATION     . enalapril (VASOTEC) 5 MG tablet Take 1 tablet (5 mg total) by mouth daily. 30 tablet 3   No current  facility-administered medications for this visit.    Review of Systems Review of Systems  Constitutional: Positive for unexpected weight change (loss).  Gastrointestinal: Negative.   Genitourinary: Positive for pelvic pain and vaginal discharge. Negative for vaginal bleeding.    Blood pressure (!) 143/83, pulse 87, weight 115 lb (52.2 kg), last menstrual period 07/27/2020, currently breastfeeding.  Physical Exam Physical Exam Vitals and nursing note reviewed. Exam conducted with a chaperone present.  Constitutional:      Appearance: Normal appearance.  Cardiovascular:     Rate and Rhythm: Normal rate.  Pulmonary:     Effort: Pulmonary effort is normal.  Abdominal:     General: Abdomen is flat.     Palpations: Abdomen is soft.  Genitourinary:    Vagina: No vaginal discharge.     Comments: Pelvic exam: normal external genitalia, vulva, vagina, cervix, uterus and adnexa, vaginal discharge noted, uterus retroverted mild tenderness.   Neurological:     Mental Status: She is alert.     Data Reviewed Pap results, STD testing  Assessment Pelvic pain in female - Plan: Cytology - PAP( [place]), Cervicovaginal ancillary only( [place]), HIV Antibody (routine testing w rflx)  Retroverted uterus  Plan F/U on test results, notify if infection noted. She recently stopped breastfeeding and we will see if she successfully conceives in the next few months. We discussed her fertility will be affected by AMA    Scheryl Darter 08/03/2020, 10:39 AM

## 2020-08-04 LAB — CERVICOVAGINAL ANCILLARY ONLY
Bacterial Vaginitis (gardnerella): POSITIVE — AB
Candida Glabrata: NEGATIVE
Candida Vaginitis: NEGATIVE
Chlamydia: NEGATIVE
Comment: NEGATIVE
Comment: NEGATIVE
Comment: NEGATIVE
Comment: NEGATIVE
Comment: NEGATIVE
Comment: NORMAL
Neisseria Gonorrhea: NEGATIVE
Trichomonas: NEGATIVE

## 2020-08-04 LAB — HIV ANTIBODY (ROUTINE TESTING W REFLEX): HIV Screen 4th Generation wRfx: NONREACTIVE

## 2020-08-05 LAB — CYTOLOGY - PAP
Comment: NEGATIVE
Diagnosis: NEGATIVE
High risk HPV: NEGATIVE

## 2020-08-06 ENCOUNTER — Other Ambulatory Visit (HOSPITAL_COMMUNITY): Payer: Self-pay | Admitting: Obstetrics & Gynecology

## 2020-08-06 ENCOUNTER — Other Ambulatory Visit: Payer: Self-pay

## 2020-08-06 DIAGNOSIS — B9689 Other specified bacterial agents as the cause of diseases classified elsewhere: Secondary | ICD-10-CM

## 2020-08-06 DIAGNOSIS — N898 Other specified noninflammatory disorders of vagina: Secondary | ICD-10-CM

## 2020-08-06 MED ORDER — CLINDAMYCIN PHOSPHATE 100 MG VA SUPP: 100.0000 mg | Freq: Every day | VAGINAL | 0 refills | Status: DC

## 2020-08-06 NOTE — Progress Notes (Signed)
Meds ordered this encounter  Medications   clindamycin (CLEOCIN) 100 MG vaginal suppository    Sig: Place 1 suppository (100 mg total) vaginally at bedtime.    Dispense:  3 suppository    Refill:  0    

## 2020-08-13 ENCOUNTER — Ambulatory Visit: Payer: Medicaid Other | Admitting: Obstetrics & Gynecology

## 2020-08-28 IMAGING — US US OB COMP LESS 14 WK
1 series · 15 of 25 positions shown · non-contrast
Comparison: 06/01/2018

CLINICAL DATA: First trimester pregnancy with inconclusive fetal
viability. Current assigned gestational age of 11 weeks 6 days by
prior ultrasound.

EXAM:
OBSTETRIC <14 WK ULTRASOUND
TECHNIQUE: Transabdominal ultrasound was performed for evaluation of the
gestation as well as the maternal uterus and adnexal regions.

[Series 1: us ob comp less 14 wk · 15 of 25 slices shown]
[im 1/25]
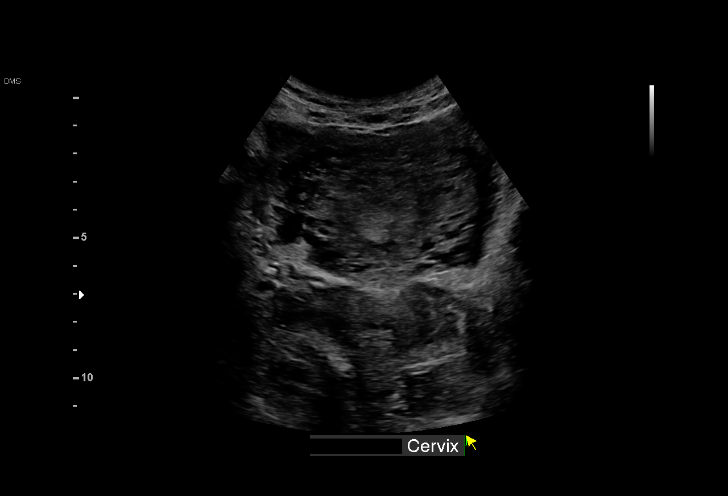
[im 3/25]
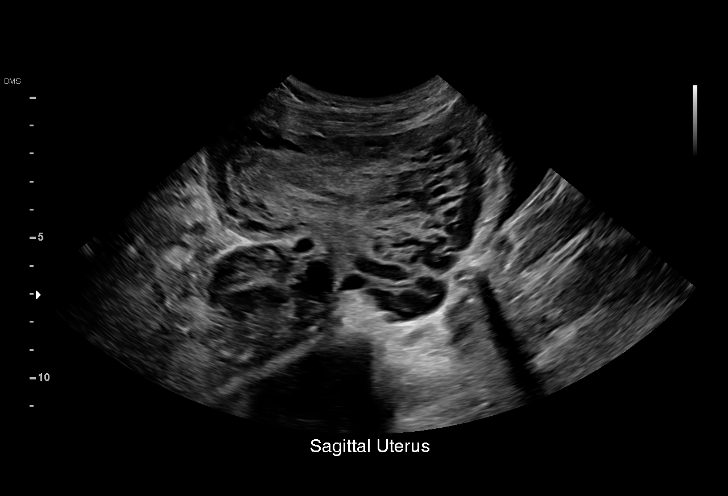
[im 5/25]
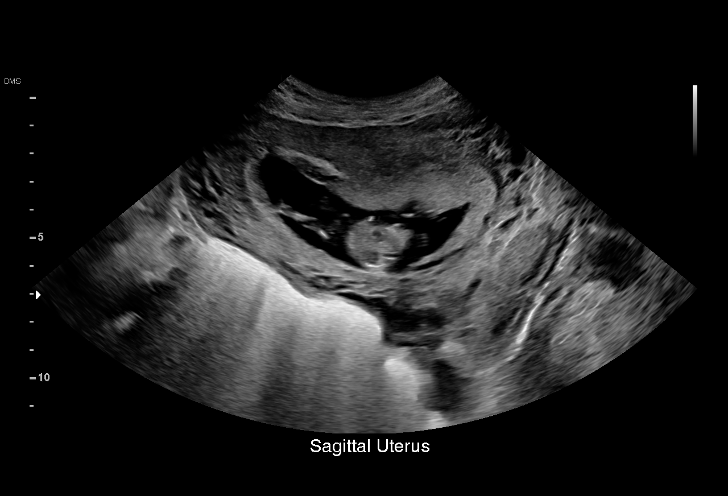
[im 6/25]
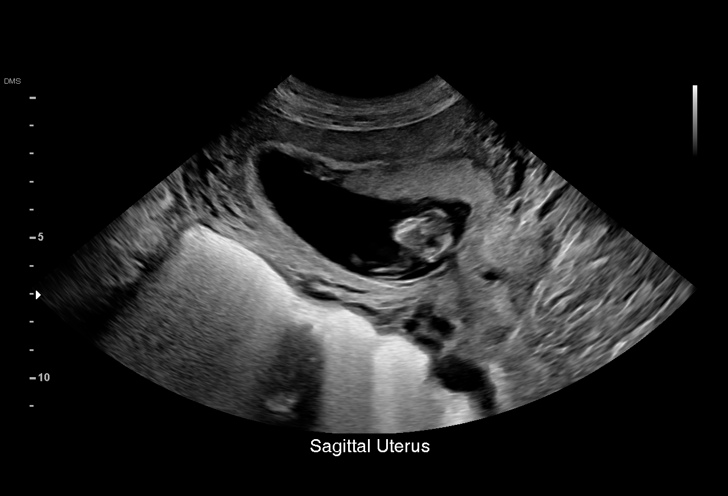
[im 8/25]
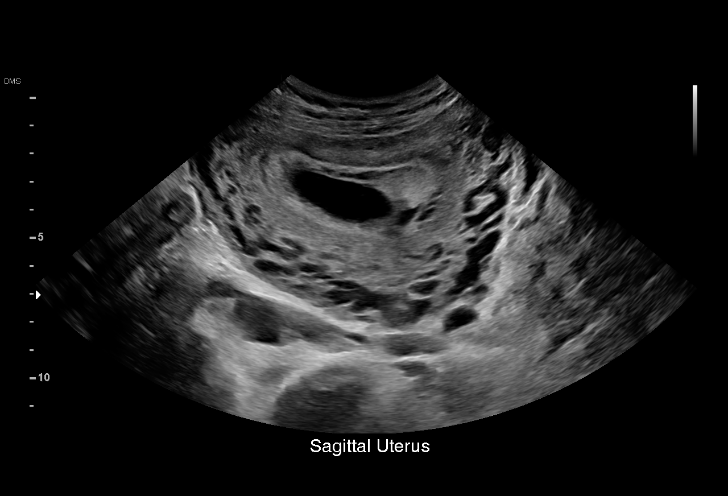
[im 10/25]
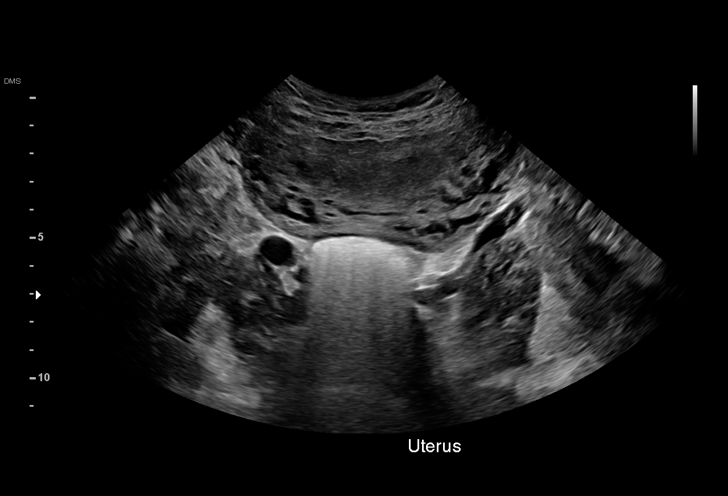
[im 11/25]
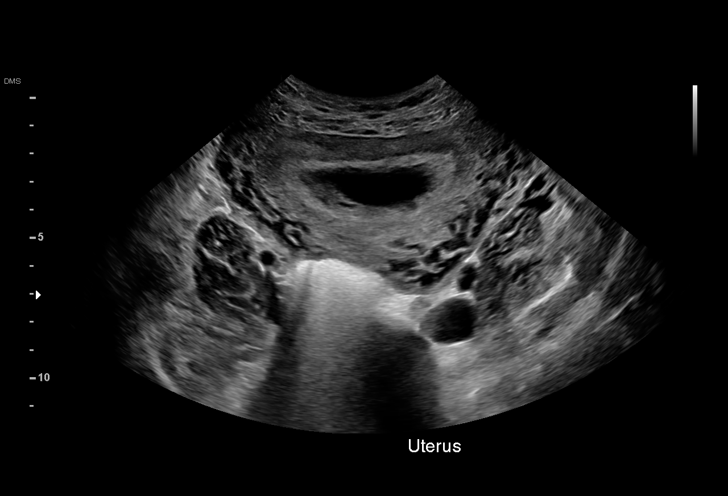
[im 13/25]
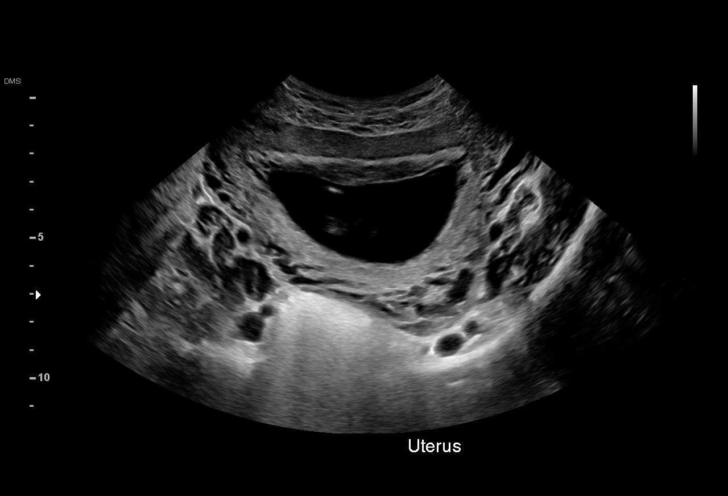
[im 15/25]
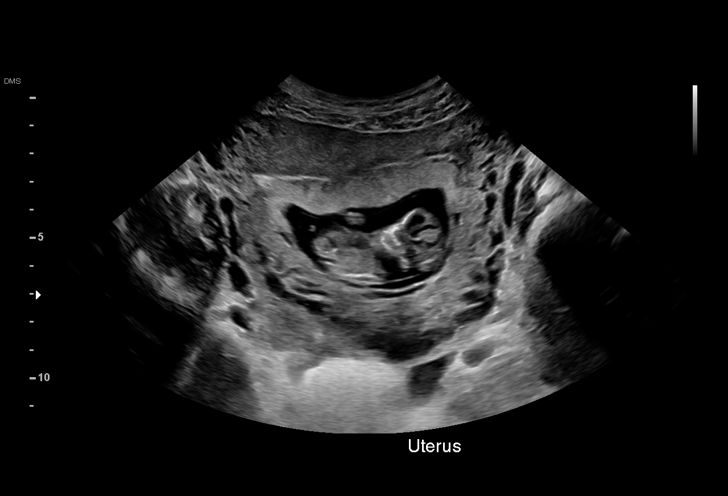
[im 16/25]
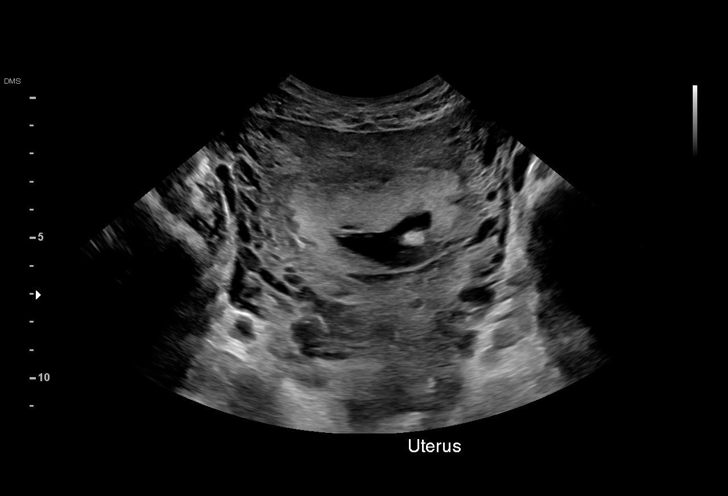
[im 18/25]
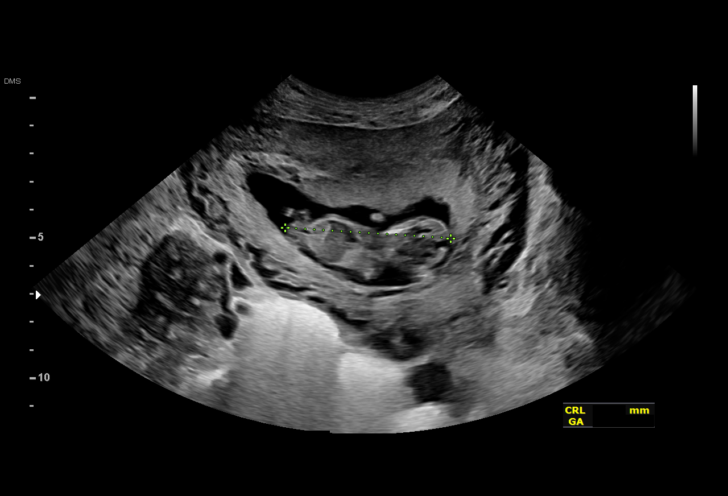
[im 20/25]
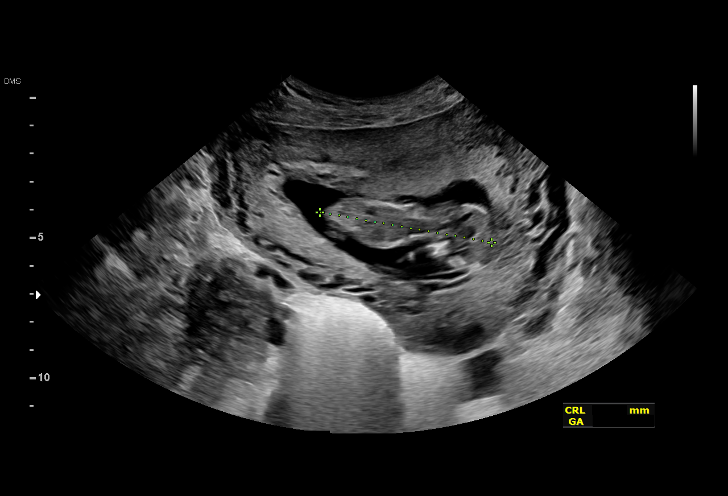
[im 21/25]
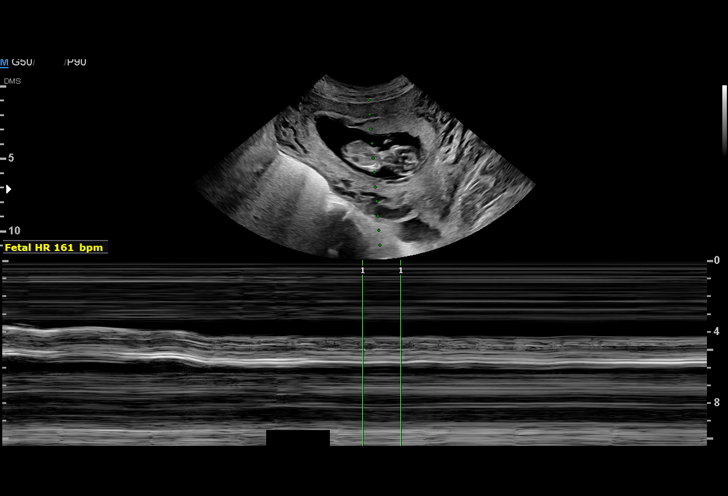
[im 23/25]
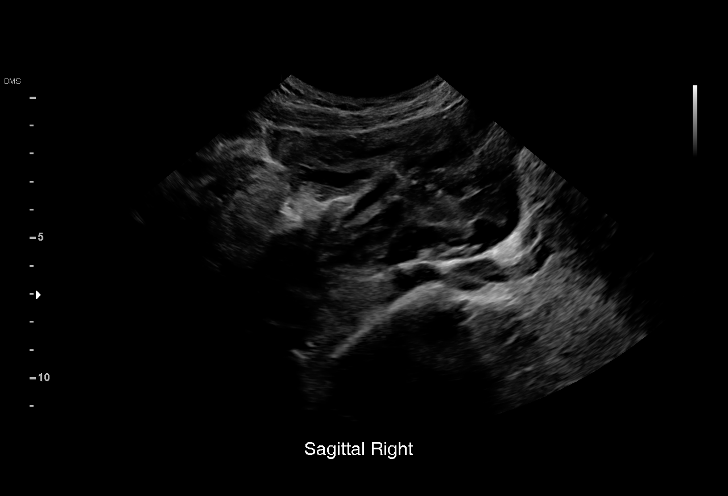
[im 25/25]
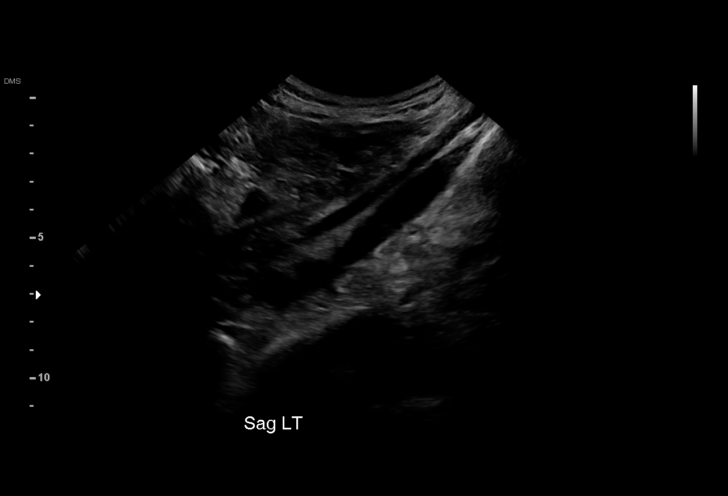

[15 of 25 positions shown; findings below may reference images not displayed]

FINDINGS: Intrauterine gestational sac: Single

Yolk sac:  Not Visualized.

Embryo:  Visualized.

Cardiac Activity: Visualized.

Heart Rate: 161 bpm

CRL:   61 mm   12 w 4 d                  US EDC: 01/19/2019

Subchorionic hemorrhage:  None visualized.

Maternal uterus/adnexae: Neither ovary is directly visualized on
this exam, however no adnexal mass or abnormal free fluid
identified.
IMPRESSION: Single living IUP with assigned gestational age of 11 weeks 6 days
by prior ultrasound. Appropriate interval growth.

## 2020-10-24 IMAGING — US US MFM OB TRANSVAGINAL
1 series · 13 of 28 positions shown · non-contrast
Comparison: none

[Series 1: us mfm ob transvaginal · 85 acquisitions, 13 frames shown]
[im 4/85]
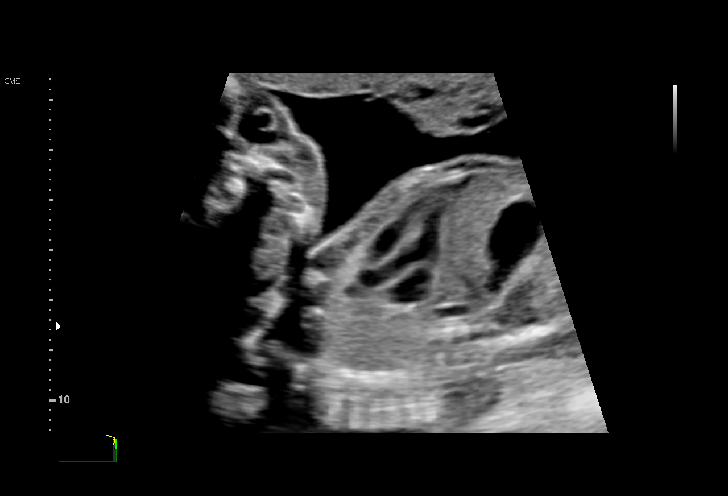
[im 10/85]
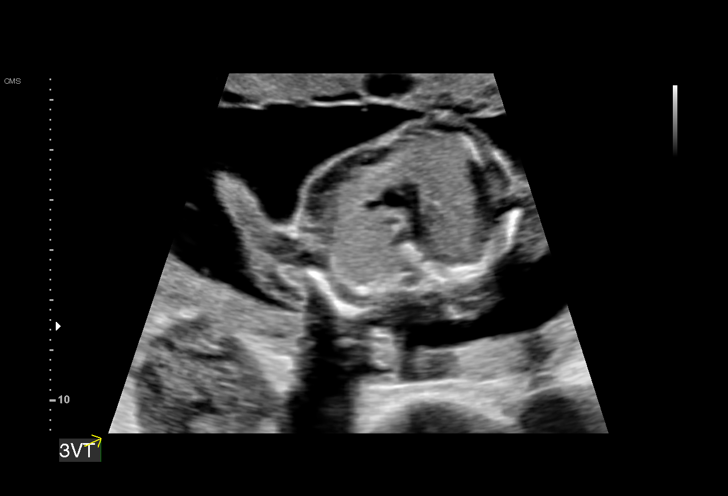
[im 16/85]
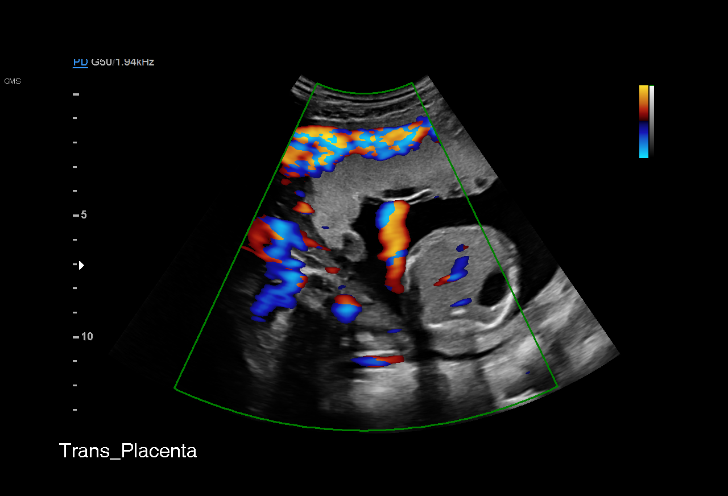
[im 22/85]
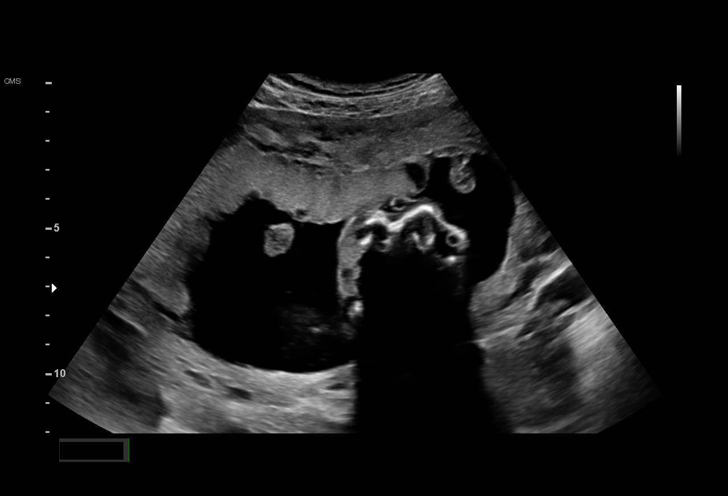
[im 29/85]
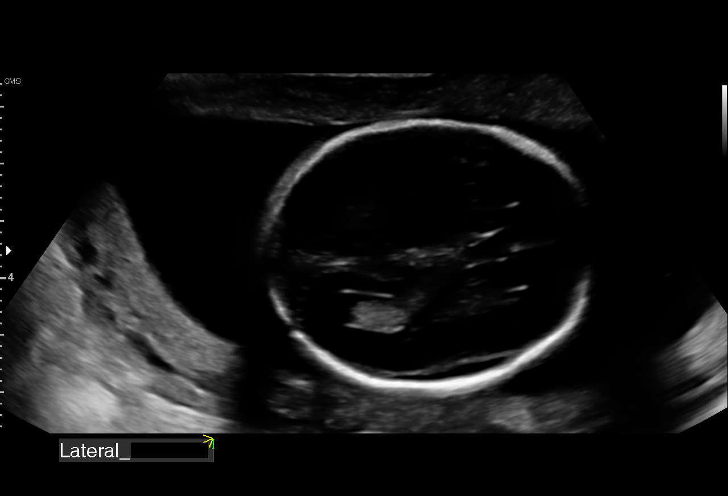
[im 35/85]
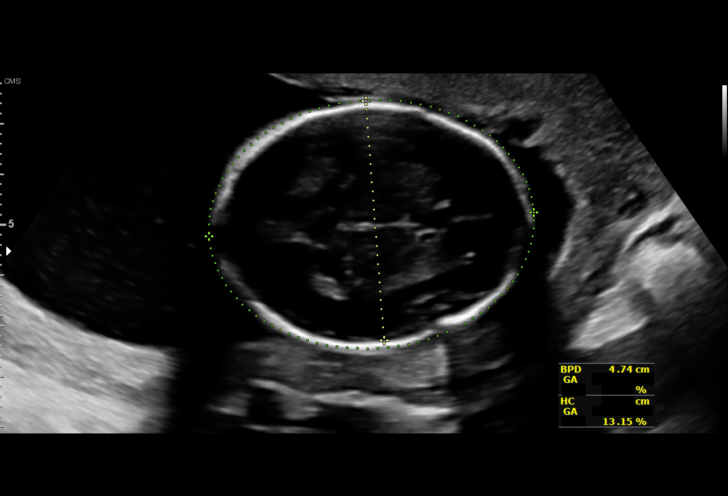
[im 44/85]
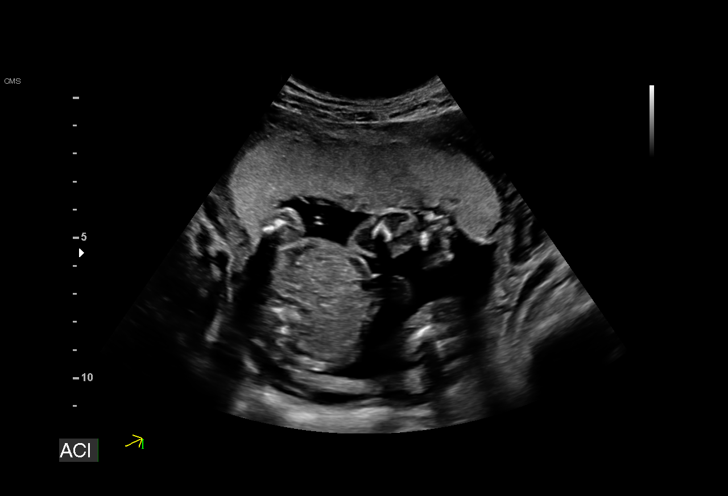
[im 50/85]
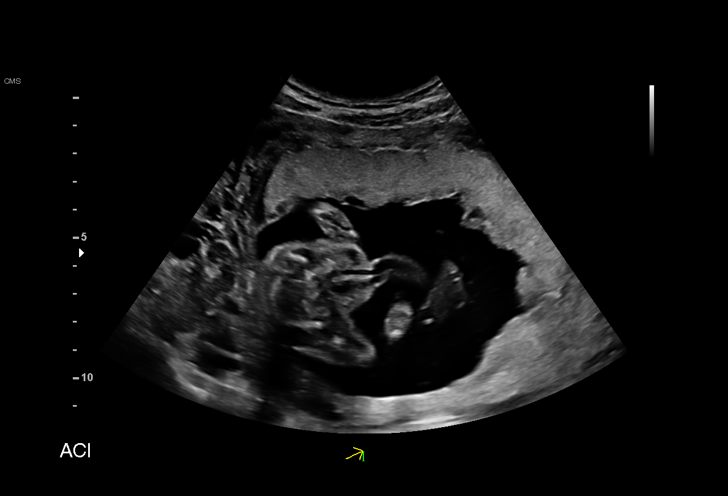
[im 57/85]
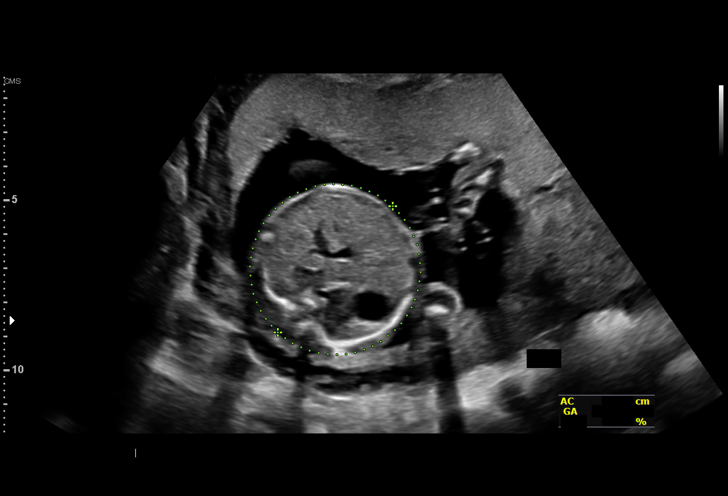
[im 63/85]
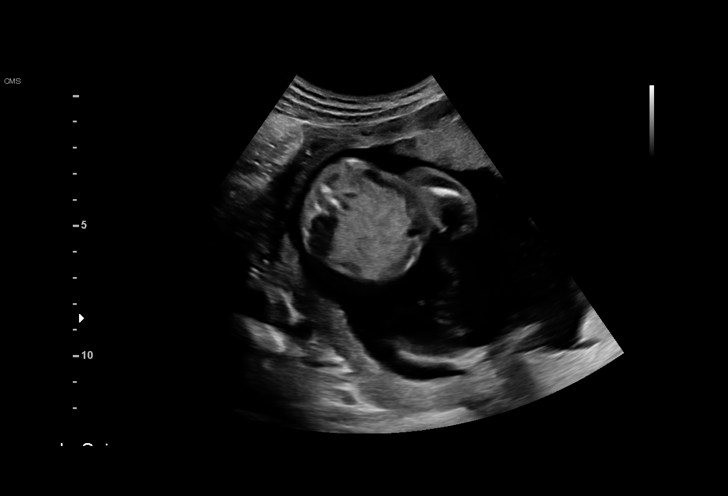
[im 69/85]
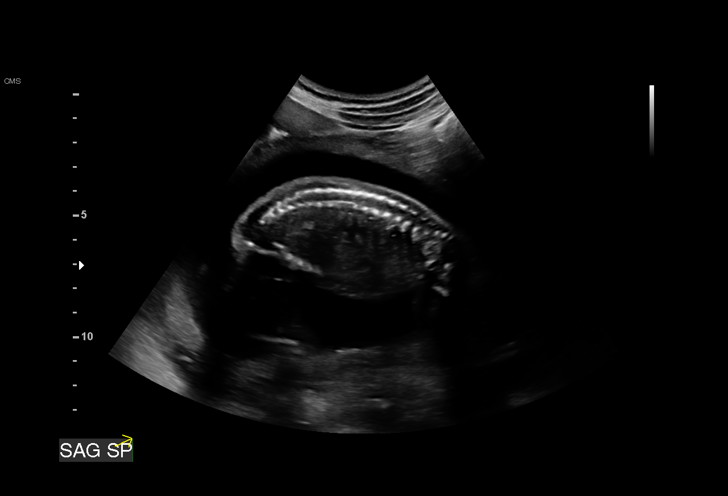
[im 75/85]
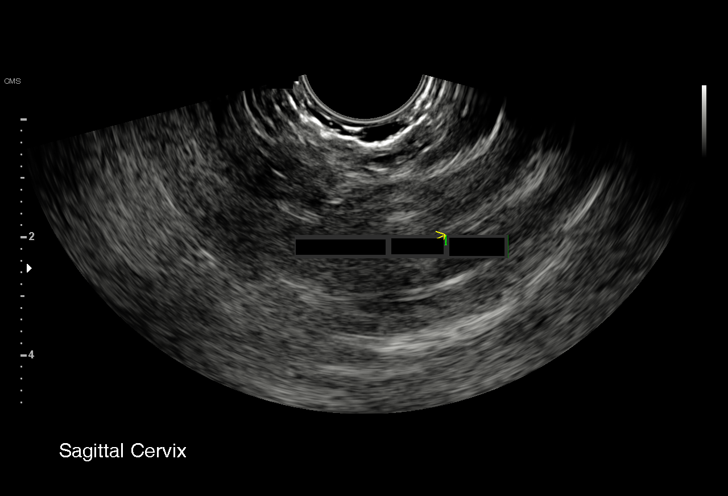
[im 81/85]
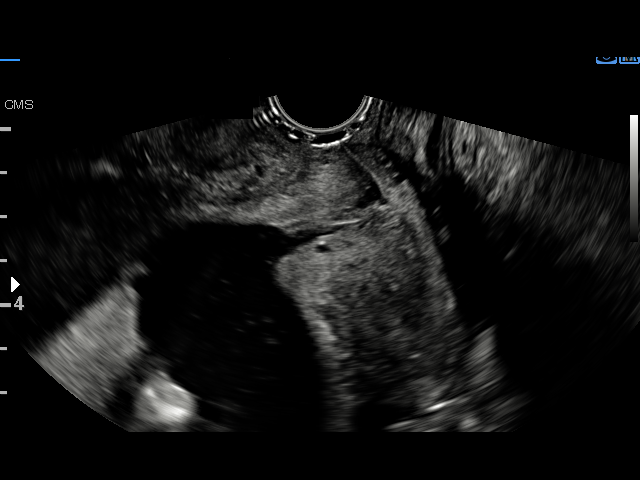

[13 of 28 positions shown; findings below may reference images not displayed]

OB/Gyn Clinic

 ----------------------------------------------------------------------

 ----------------------------------------------------------------------
Indications

  Encounter for antenatal screening for
  malformations
  Advanced maternal age multigravida 35+,
  second trimester (low risk NIPS)
  Poor obstetric history: Previous preterm
  delivery, antepartum (29, 32, & 33 wks)
  Encounter for cervical length
  Cervical insufficiency, 2nd
  21 weeks gestation of pregnancy
 ----------------------------------------------------------------------
Fetal Evaluation

 Num Of Fetuses:         1
 Fetal Heart Rate(bpm):  150
 Cardiac Activity:       Observed
 Presentation:           Breech
 Placenta:               Anterior
 P. Cord Insertion:      Visualized

 Amniotic Fluid
 AFI FV:      Within normal limits

                             Largest Pocket(cm)

Biometry

 BPD:      47.3  mm     G. Age:  20w 2d         17  %    CI:        71.31   %    70 - 86
                                                         FL/HC:      18.7   %    15.9 -
 HC:      178.4  mm     G. Age:  20w 2d         11  %    HC/AC:      1.13        1.06 -
 AC:      157.9  mm     G. Age:  20w 6d         36  %    FL/BPD:     70.6   %
 FL:       33.4  mm     G. Age:  20w 3d         20  %    FL/AC:      21.2   %    20 - 24
 HUM:        31  mm     G. Age:  20w 2d         24  %
 CER:      21.6  mm     G. Age:  20w 4d         36  %

 Est. FW:     368  gm    0 lb 13 oz      33  %
OB History

 Gravidity:    8         Term:   1        Prem:   3
 TOP:          3        Living:  4
Gestational Age

 LMP:           21w 1d        Date:  04/11/18                 EDD:   01/16/19
 U/S Today:     20w 3d                                        EDD:   01/21/19
 Best:          21w 1d     Det. By:  LMP  (04/11/18)          EDD:   01/16/19
Anatomy

 Cranium:               Appears normal         LVOT:                   Appears normal
 Cavum:                 Appears normal         Aortic Arch:            Appears normal
 Ventricles:            Appears normal         Ductal Arch:            Appears normal
 Choroid Plexus:        Appears normal         Diaphragm:              Appears normal
 Cerebellum:            Appears normal         Stomach:                Appears normal, left
                                                                       sided
 Posterior Fossa:       Appears normal         Abdomen:                Appears normal
 Nuchal Fold:           Not applicable (>20    Abdominal Wall:         Appears nml (cord
                        wks GA)                                        insert, abd wall)
 Face:                  Appears normal         Cord Vessels:           Appears normal (3
                        (orbits and profile)                           vessel cord)
 Lips:                  Appears normal         Kidneys:                Appear normal
 Palate:                Not well visualized    Bladder:                Appears normal
 Thoracic:              Appears normal         Spine:                  Appears normal
 Heart:                 Appears normal         Upper Extremities:      Appears normal
                        (4CH, axis, and
                        situs)
 RVOT:                  Appears normal         Lower Extremities:      Appears normal

 Other:  Heels and 5th digit visualized. Nasal bone visualized.
Cervix Uterus Adnexa

 Cervix
 Length:            1.5  cm.
 Measured transvaginally.

 Uterus
 No abnormality visualized.

 Left Ovary
 Within normal limits.

 Right Ovary
 Within normal limits.
 Cul De Sac
 No free fluid seen.

 Adnexa
 No abnormality visualized.
Comments

 U/S images reviewed. Findings reviewed.   Appropriate fetal
 growth is noted.   No fetal abnormalities are seen.  Cervix is
 shortened measuring 1.5 cm in length on TV scanning.
 Patient left without seeing a physician.  Faculty [REDACTED]
 contacted
 Recommendations: 1) Cervical cerclage  2) 17-OH
 Progesterone Caproate 3) Serial U/S every 4 weeks for fetal
 growth  4) Weekly BPP beginning @ 36 weeks
Recommendations

 1) Cervical cerclage  2) 17-OH Progesterone Caproate 3)
 Serial U/S every 4 weeks for fetal growth  4) Weekly BPP
 beginning @ 36 weeks

              Kurdapya, K[place]og
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
## 2021-01-04 ENCOUNTER — Encounter: Payer: Self-pay | Admitting: Obstetrics

## 2021-01-04 ENCOUNTER — Ambulatory Visit (INDEPENDENT_AMBULATORY_CARE_PROVIDER_SITE_OTHER): Payer: Medicaid Other | Admitting: Obstetrics

## 2021-01-04 ENCOUNTER — Other Ambulatory Visit: Payer: Self-pay

## 2021-01-04 ENCOUNTER — Other Ambulatory Visit (HOSPITAL_COMMUNITY)
Admission: RE | Admit: 2021-01-04 | Discharge: 2021-01-04 | Disposition: A | Discharge status: Home / Self Care | Payer: Medicaid Other | Source: Ambulatory Visit | Attending: Obstetrics | Admitting: Obstetrics

## 2021-01-04 VITALS — BP 135/81 | HR 75 | Wt 118.0 lb

## 2021-01-04 DIAGNOSIS — N760 Acute vaginitis: Secondary | ICD-10-CM | POA: Diagnosis not present

## 2021-01-04 DIAGNOSIS — B373 Candidiasis of vulva and vagina: Secondary | ICD-10-CM | POA: Diagnosis not present

## 2021-01-04 DIAGNOSIS — R102 Pelvic and perineal pain: Secondary | ICD-10-CM

## 2021-01-04 DIAGNOSIS — I10 Essential (primary) hypertension: Secondary | ICD-10-CM

## 2021-01-04 DIAGNOSIS — Z3009 Encounter for other general counseling and advice on contraception: Secondary | ICD-10-CM | POA: Diagnosis not present

## 2021-01-04 DIAGNOSIS — Z30011 Encounter for initial prescription of contraceptive pills: Secondary | ICD-10-CM | POA: Diagnosis not present

## 2021-01-04 DIAGNOSIS — B9689 Other specified bacterial agents as the cause of diseases classified elsewhere: Secondary | ICD-10-CM | POA: Diagnosis not present

## 2021-01-04 DIAGNOSIS — N898 Other specified noninflammatory disorders of vagina: Secondary | ICD-10-CM

## 2021-01-04 DIAGNOSIS — R300 Dysuria: Secondary | ICD-10-CM

## 2021-01-04 DIAGNOSIS — B3731 Acute candidiasis of vulva and vagina: Secondary | ICD-10-CM

## 2021-01-04 LAB — POCT URINALYSIS DIPSTICK
Bilirubin, UA: NEGATIVE
Glucose, UA: NEGATIVE
Ketones, UA: NEGATIVE
Leukocytes, UA: NEGATIVE
Nitrite, UA: NEGATIVE
Protein, UA: NEGATIVE
Spec Grav, UA: 1.02 (ref 1.010–1.025)
Urobilinogen, UA: 0.2 E.U./dL
pH, UA: 6 (ref 5.0–8.0)

## 2021-01-04 MED ORDER — LO LOESTRIN FE 1 MG-10 MCG / 10 MCG PO TABS: 1.0000 | ORAL_TABLET | Freq: Every day | ORAL | 11 refills | Status: DC

## 2021-01-04 MED ORDER — IBUPROFEN 800 MG PO TABS: 800.0000 mg | ORAL_TABLET | Freq: Three times a day (TID) | ORAL | 5 refills | Status: DC | PRN

## 2021-01-04 MED ORDER — FLUCONAZOLE 150 MG PO TABS: 150.0000 mg | ORAL_TABLET | Freq: Once | ORAL | 2 refills | Status: AC

## 2021-01-04 MED ORDER — METRONIDAZOLE 0.75 % VA GEL: 1.0000 | Freq: Two times a day (BID) | VAGINAL | 5 refills | Status: DC

## 2021-01-04 NOTE — Progress Notes (Signed)
RGYN patient presents for problem visit today.  LMP: 12/18/20-12/22/20 pt states first day will be light , then heavy by third day flow is moderate.   CC: Cramping pain is 8/10x today is 6/10x. Patient describes pain as being sharp  pt notes cycles are heavier. Pt notes vaginal discharge that is thick and white, no itching.   Pt wants swab done and wants to be tested for everything.   Contraception: None.

## 2021-01-04 NOTE — Progress Notes (Signed)
Patient ID: Kathy Dean, female   DOB: 12-25-1980, 40 y.o.   MRN: [id number]  Chief Complaint  Patient presents with  . Pelvic Pain    HPI Kathy Dean is a 40 y.o. female.  Complains of irregular periods that are heavier.  Also c/o pelvic pain and vaginal discharge.   Has occasional difficulty completey emptying bladder. HPI  Past Medical History:  Diagnosis Date  . Abnormal Pap smear    rpt was ok  . Anemia   . Chronic hypertension   . Headache(784.0)   . History of blood transfusion   . Hyperemesis gravidarum   . Pregnancy induced hypertension   . Preterm labor   . Vaginal Pap smear, abnormal     Past Surgical History:  Procedure Laterality Date  . INDUCED ABORTION    . NO PAST SURGERIES      Family History  Problem Relation Age of Onset  . Stroke Maternal Grandmother   . Cancer Maternal Grandfather   . Hypertension Mother   . Diabetes Mother   . Hypertension Father   . Other Neg Hx     Social History Social History   Tobacco Use  . Smoking status: Never Smoker  . Smokeless tobacco: Never Used  Vaping Use  . Vaping Use: Never used  Substance Use Topics  . Alcohol use: Not Currently    Alcohol/week: 21.0 standard drinks    Types: 21 Standard drinks or equivalent per week    Comment: occassionally  . Drug use: Yes    Types: Other-see comments    Comment: last used 1.5 weeks ago / uses CBD oil    Allergies  Allergen Reactions  . Flagyl [Metronidazole] Swelling and Dermatitis    Patient states can take gel form.   . Latex Swelling and Dermatitis    Current Outpatient Medications  Medication Sig Dispense Refill  . fluconazole (DIFLUCAN) 150 MG tablet Take 1 tablet (150 mg total) by mouth once for 1 dose. 1 tablet 2  . ibuprofen (ADVIL) 800 MG tablet Take 1 tablet (800 mg total) by mouth every 8 (eight) hours as needed. 30 tablet 5  . LO LOESTRIN FE 1 MG-10 MCG / 10 MCG tablet Take 1 tablet by mouth daily. 28 tablet 11  . metroNIDAZOLE (METROGEL  VAGINAL) 0.75 % vaginal gel Place 1 Applicatorful vaginally 2 (two) times daily. 70 g 5  . Omega-3 Fatty Acids (FISH OIL) 1000 MG CAPS Take by mouth.    Marland Kitchen OVER THE COUNTER MEDICATION     . clindamycin (CLEOCIN) 100 MG vaginal suppository Place 1 suppository (100 mg total) vaginally at bedtime. (Patient not taking: Reported on 01/04/2021) 3 suppository 0  . enalapril (VASOTEC) 5 MG tablet Take 1 tablet (5 mg total) by mouth daily. (Patient not taking: Reported on 01/04/2021) 30 tablet 3   No current facility-administered medications for this visit.    Review of Systems Review of Systems Constitutional: negative for fatigue and weight loss Respiratory: negative for cough and wheezing Cardiovascular: negative for chest pain, fatigue and palpitations Gastrointestinal: positive  for abdominal pain, and negative for change in bowel habits Genitourinary: positive for pelvic pain, irregular periods and malodorous vaginal discharge Integument/breast: negative for nipple discharge Musculoskeletal:negative for myalgias Neurological: negative for gait problems and tremors Behavioral/Psych: negative for abusive relationship, depression Endocrine: negative for temperature intolerance      Blood pressure 135/81, pulse 75, weight 118 lb (53.5 kg), currently breastfeeding.  Physical Exam Physical Exam General:   alert  and no distress  Skin:   no rash or abnormalities  Lungs:   clear to auscultation bilaterally  Heart:   regular rate and rhythm, S1, S2 normal, no murmur, click, rub or gallop  Breasts:   not examined  Abdomen:  normal findings: no organomegaly, soft, non-tender and no hernia  Pelvis:  External genitalia: normal general appearance Urinary system: urethral meatus normal and bladder without fullness, nontender Vaginal: normal without tenderness, induration or masses Cervix: normal appearance Adnexa: normal bimanual exam Uterus: anteverted and non-tender, normal size    I have spent a  total of 20 minutes of face-to-face time, excluding clinical staff time, reviewing notes and preparing to see patient, ordering tests and/or medications, and counseling the patient.  Data Reviewed Wet Prep and Cultures  Assessment     1. Pelvic pain Rx: - US PELVIC COMPLETE WITH TRANSVAGINAL; Future - ibuprofen (ADVIL) 800 MG tablet; Take 1 tablet (800 mg total) by mouth every 8 (eight) hours as needed.  Dispense: 30 tablet; Refill: 5 - start taking Ibuprofen for dysmenorrhea at the beginning of period and continue every 8 hours until period ends, monthly  2. Vaginal discharge Rx: - Cervicovaginal ancillary only( Kathy Dean)  3. BV (bacterial vaginosis) Rx: - metroNIDAZOLE (METROGEL VAGINAL) 0.75 % vaginal gel; Place 1 Applicatorful vaginally 2 (two) times daily.  Dispense: 70 g; Refill: 5  4. Candida vaginitis Rx: - fluconazole (DIFLUCAN) 150 MG tablet; Take 1 tablet (150 mg total) by mouth once for 1 dose.  Dispense: 1 tablet; Refill: 2  5. Dysuria Rx: - POCT Urinalysis Dipstick - Urine Culture  6. Encounter for counseling regarding contraception - discussed low dose OCP's for cycle regulation, and she agrees to try with close clinical monitoring for onset of HA's, visual changes - patient will monitor BP closely at home  7. Encounter for initial prescription of contraceptive pills Rx: - LO LOESTRIN FE 1 MG-10 MCG / 10 MCG tablet; Take 1 tablet by mouth daily.  Dispense: 28 tablet; Refill: 11 - start taking pill on the 1st day of period  8. Chronic hypertension - clinically stable, but will monitor closely on OCP's    Plan   Follow up in 2 weeks  Orders Placed This Encounter  Procedures  . Urine Culture  . US PELVIC COMPLETE WITH TRANSVAGINAL    Standing Status:   Future    Standing Expiration Date:   01/04/2022    Order Specific Question:   Reason for Exam (SYMPTOM  OR DIAGNOSIS REQUIRED)    Answer:   Pevic pain    Order Specific Question:   Preferred imaging  location?    Answer:   [facility]  . POCT Urinalysis Dipstick   Meds ordered this encounter  Medications  . metroNIDAZOLE (METROGEL VAGINAL) 0.75 % vaginal gel    Sig: Place 1 Applicatorful vaginally 2 (two) times daily.    Dispense:  70 g    Refill:  5  . fluconazole (DIFLUCAN) 150 MG tablet    Sig: Take 1 tablet (150 mg total) by mouth once for 1 dose.    Dispense:  1 tablet    Refill:  2  . ibuprofen (ADVIL) 800 MG tablet    Sig: Take 1 tablet (800 mg total) by mouth every 8 (eight) hours as needed.    Dispense:  30 tablet    Refill:  5  . LO LOESTRIN FE 1 MG-10 MCG / 10 MCG tablet    Sig: Take 1 tablet  by mouth daily.    Dispense:  28 tablet    Refill:  11    Submit other coverage code 3  BIN:  [id number]  PCN:  [id number]   GRP:  [id number]   ID:  [id number]     Brock Bad, MD 01/04/2021 3:47 PM

## 2021-01-05 LAB — CERVICOVAGINAL ANCILLARY ONLY
Bacterial Vaginitis (gardnerella): POSITIVE — AB
Candida Glabrata: NEGATIVE
Candida Vaginitis: NEGATIVE
Chlamydia: NEGATIVE
Comment: NEGATIVE
Comment: NEGATIVE
Comment: NEGATIVE
Comment: NEGATIVE
Comment: NEGATIVE
Comment: NORMAL
Neisseria Gonorrhea: NEGATIVE
Trichomonas: NEGATIVE

## 2021-01-06 ENCOUNTER — Other Ambulatory Visit: Payer: Self-pay | Admitting: Obstetrics

## 2021-01-07 LAB — URINE CULTURE: Organism ID, Bacteria: NO GROWTH

## 2021-01-14 ENCOUNTER — Ambulatory Visit: Payer: No Typology Code available for payment source | Attending: Obstetrics

## 2021-01-14 ENCOUNTER — Other Ambulatory Visit: Payer: Self-pay

## 2021-01-25 ENCOUNTER — Telehealth: Payer: Medicaid Other | Admitting: Obstetrics

## 2021-02-01 ENCOUNTER — Ambulatory Visit (HOSPITAL_BASED_OUTPATIENT_CLINIC_OR_DEPARTMENT_OTHER): Payer: Medicaid Other

## 2021-02-02 ENCOUNTER — Ambulatory Visit (HOSPITAL_BASED_OUTPATIENT_CLINIC_OR_DEPARTMENT_OTHER)
Admission: RE | Admit: 2021-02-02 | Discharge: 2021-02-02 | Disposition: A | Discharge status: Home / Self Care | Payer: Medicaid Other | Source: Ambulatory Visit | Attending: Obstetrics | Admitting: Obstetrics

## 2021-02-02 ENCOUNTER — Other Ambulatory Visit: Payer: Self-pay | Admitting: Obstetrics

## 2021-02-02 ENCOUNTER — Other Ambulatory Visit: Payer: Self-pay

## 2021-02-02 DIAGNOSIS — R102 Pelvic and perineal pain: Secondary | ICD-10-CM | POA: Diagnosis not present

## 2021-02-08 ENCOUNTER — Telehealth (INDEPENDENT_AMBULATORY_CARE_PROVIDER_SITE_OTHER): Payer: Medicaid Other | Admitting: Obstetrics

## 2021-02-08 DIAGNOSIS — R9389 Abnormal findings on diagnostic imaging of other specified body structures: Secondary | ICD-10-CM

## 2021-02-08 DIAGNOSIS — Z1239 Encounter for other screening for malignant neoplasm of breast: Secondary | ICD-10-CM

## 2021-02-08 DIAGNOSIS — N939 Abnormal uterine and vaginal bleeding, unspecified: Secondary | ICD-10-CM | POA: Diagnosis not present

## 2021-02-08 NOTE — Progress Notes (Signed)
GYNECOLOGY VIRTUAL VISIT ENCOUNTER NOTE  Provider location: Center for [facility]   Patient location: Home  I connected with [name] on 02/08/21 at 11:15 AM EDT by MyChart Video Encounter and verified that I am speaking with the correct person using two identifiers.   I discussed the limitations, risks, security and privacy concerns of performing an evaluation and management service virtually and the availability of in person appointments. I also discussed with the patient that there may be a patient responsible charge related to this service. The patient expressed understanding and agreed to proceed.   History:  Kathy Dean is a 40 y.o. [id number] female being evaluated today for results of ultrasound and management recommendations. She denies any abnormal vaginal discharge, bleeding, pelvic pain or other concerns.       Past Medical History:  Diagnosis Date  . Abnormal Pap smear    rpt was ok  . Anemia   . Chronic hypertension   . Headache(784.0)   . History of blood transfusion   . Hyperemesis gravidarum   . Pregnancy induced hypertension   . Preterm labor   . Vaginal Pap smear, abnormal    Past Surgical History:  Procedure Laterality Date  . INDUCED ABORTION    . NO PAST SURGERIES     The following portions of the patient's history were reviewed and updated as appropriate: allergies, current medications, past family history, past medical history, past social history, past surgical history and problem list.   Health Maintenance:  Normal pap and negative HRHPV on 08-03-2020.  Normal mammogram on 06-27-2017.   Review of Systems:  Pertinent items noted in HPI and remainder of comprehensive ROS otherwise negative.  Physical Exam:   General:  Alert, oriented and cooperative. Patient appears to be in no acute distress.  Mental Status: Normal mood and affect. Normal behavior. Normal judgment and thought content.   Respiratory: Normal respiratory  effort, no problems with respiration noted  Rest of physical exam deferred due to type of encounter  Labs and Imaging No results found for this or any previous visit (from the past 336 hour(s)). US PELVIC COMPLETE WITH TRANSVAGINAL  Result Date: 02/02/2021 CLINICAL DATA:  Pelvic pain and irregular menses for 6 months, unknown LMP EXAM: TRANSABDOMINAL AND TRANSVAGINAL ULTRASOUND OF PELVIS TECHNIQUE: Both transabdominal and transvaginal ultrasound examinations of the pelvis were performed. Transabdominal technique was performed for global imaging of the pelvis including uterus, ovaries, adnexal regions, and pelvic cul-de-sac. It was necessary to proceed with endovaginal exam following the transabdominal exam to visualize the adnexa. COMPARISON:  09/12/2012 FINDINGS: Uterus Measurements: 7.4 x 6.0 x 6.4 cm = volume: 145 mL. Retroverted. Normal morphology without mass Endometrium Thickness: 19 mm. Slightly heterogeneous without obvious mass or fluid Right ovary Measurements: 2.8 x 2.0 x 2.2 cm = volume: 5.9 mL. Normal morphology without mass Left ovary Measurements: 2.5 x 2.7 x 1.7 cm = volume: 6.0 mL. Small corpus luteum; no follow-up imaging recommended. Otherwise unremarkable. Other findings Small amount of nonspecific free pelvic fluid.  No adnexal masses. IMPRESSION: Thickened endometrial complex 19 mm thick; endometrial thickness is considered abnormal. Consider follow-up by [place] in 6-8 weeks, during the week immediately following menses (exam timing is critical). Remainder of exam unremarkable. Electronically Signed   By: Ulyses Southward M.D.   On: 02/02/2021 11:40       Assessment and Plan:     1. Abnormal uterine bleeding (AUB) - O - probable anovulatory cycles - patient  refuses endometrial biopsy. - OCP,s started  2. Thickened endometrium on ultrasound - ultrasound done during late secretory phase of cycle - continue OCP's and repeat ultrasound in 8-12 weeks during the early proliferative phase,  during the week after menses ceases.  Patient will call office to schedule ultrasound when her period starts in 2-3 months.   3.  Screening Mammogram ordered      I discussed the assessment and treatment plan with the patient. The patient was provided an opportunity to ask questions and all were answered. The patient agreed with the plan and demonstrated an understanding of the instructions.   The patient was advised to call back or seek an in-person evaluation/go to the ED if the symptoms worsen or if the condition fails to improve as anticipated.  I have spent a total of 15 minutes of face-to-face time, excluding clinical staff time, reviewing notes and preparing to see patient, ordering tests and/or medications, and counseling the patient.   Coral Ceo, MD Center for [facility] 02/08/2021 11:26 AM

## 2021-03-13 DIAGNOSIS — Z881 Allergy status to other antibiotic agents status: Secondary | ICD-10-CM | POA: Diagnosis not present

## 2021-03-13 DIAGNOSIS — Z793 Long term (current) use of hormonal contraceptives: Secondary | ICD-10-CM | POA: Diagnosis not present

## 2021-03-13 DIAGNOSIS — M25512 Pain in left shoulder: Secondary | ICD-10-CM | POA: Diagnosis not present

## 2021-03-13 DIAGNOSIS — Z114 Encounter for screening for human immunodeficiency virus [HIV]: Secondary | ICD-10-CM | POA: Diagnosis not present

## 2021-03-13 DIAGNOSIS — Z91040 Latex allergy status: Secondary | ICD-10-CM | POA: Diagnosis not present

## 2021-09-06 ENCOUNTER — Ambulatory Visit: Payer: Medicaid Other | Admitting: Obstetrics and Gynecology

## 2021-09-10 ENCOUNTER — Other Ambulatory Visit (HOSPITAL_COMMUNITY)
Admission: RE | Admit: 2021-09-10 | Discharge: 2021-09-10 | Disposition: A | Discharge status: Home / Self Care | Payer: Medicaid Other | Source: Ambulatory Visit | Attending: Obstetrics and Gynecology | Admitting: Obstetrics and Gynecology

## 2021-09-10 ENCOUNTER — Encounter: Payer: Self-pay | Admitting: Obstetrics and Gynecology

## 2021-09-10 ENCOUNTER — Ambulatory Visit: Payer: Medicaid Other | Admitting: Obstetrics and Gynecology

## 2021-09-10 ENCOUNTER — Other Ambulatory Visit: Payer: Self-pay

## 2021-09-10 VITALS — BP 154/90 | HR 90 | Ht 62.0 in | Wt 141.1 lb

## 2021-09-10 DIAGNOSIS — Z113 Encounter for screening for infections with a predominantly sexual mode of transmission: Secondary | ICD-10-CM | POA: Diagnosis not present

## 2021-09-10 DIAGNOSIS — R102 Pelvic and perineal pain: Secondary | ICD-10-CM | POA: Diagnosis not present

## 2021-09-10 DIAGNOSIS — Z124 Encounter for screening for malignant neoplasm of cervix: Secondary | ICD-10-CM | POA: Insufficient documentation

## 2021-09-10 DIAGNOSIS — R1030 Lower abdominal pain, unspecified: Secondary | ICD-10-CM

## 2021-09-10 LAB — POCT URINALYSIS DIPSTICK
Bilirubin, UA: NEGATIVE
Blood, UA: NEGATIVE
Glucose, UA: NEGATIVE
Ketones, UA: NEGATIVE
Leukocytes, UA: NEGATIVE
Nitrite, UA: NEGATIVE
Protein, UA: NEGATIVE
Spec Grav, UA: 1.01 (ref 1.010–1.025)
Urobilinogen, UA: 0.2 E.U./dL
pH, UA: 7 (ref 5.0–8.0)

## 2021-09-10 LAB — POCT URINE PREGNANCY: Preg Test, Ur: NEGATIVE

## 2021-09-10 NOTE — Progress Notes (Signed)
  CC: abdominal pain Subjective:    Patient ID: Kathy Dean, female    DOB: 01/08/1981, 40 y.o.   MRN: [id number]  Abdominal Pain Pertinent negatives include no constipation, diarrhea, dysuria or nausea.  40 yo G10P5, SVD x 5 seen for 1 month history of abdominal pain.  She notes a cramping pain with an intermittent stabbing pain.  She denies nausea/vomiting, constipation/diarrhea, dysuria and dyspareunia.  The pt notes regular menses which lasts 4 days.  The pain she has is not associated with her cycle or with eating.  Recent u/s  from 01/2021 was normal other than slightly thickened endometrial stripe.   Review of Systems  Constitutional: Negative.   Respiratory: Negative.    Cardiovascular: Negative.   Gastrointestinal:  Positive for abdominal pain. Negative for constipation, diarrhea and nausea.  Genitourinary:  Negative for dyspareunia, dysuria and menstrual problem.  Neurological: Negative.       Objective:   Physical Exam Constitutional:      Appearance: She is well-developed and normal weight.  HENT:     Head: Normocephalic.  Cardiovascular:     Rate and Rhythm: Normal rate and regular rhythm.  Pulmonary:     Effort: Pulmonary effort is normal.     Breath sounds: Normal breath sounds.  Abdominal:     General: Abdomen is flat. There is no distension.     Palpations: Abdomen is soft. There is no mass.     Tenderness: There is no abdominal tenderness.  Genitourinary:    Vagina: Normal.     Cervix: Normal.     Uterus: Normal. Not enlarged, not fixed and not tender.      Adnexa: Right adnexa normal and left adnexa normal.       Right: No tenderness.         Left: No tenderness.    Neurological:     Mental Status: She is alert.   Vitals:   09/10/21 0957 09/10/21 1008  BP: (!) 155/110 (!) 154/90  Pulse: 82 90         Assessment & Plan:   1. Lower abdominal pain If pain persists , consider GI referral  - POCT urine pregnancy - POCT Urinalysis  Dipstick  2. Pelvic pain in female Pain does not seem consistent with gyn origin, uterus is not enlarged, there is no CMT or adnexal pain Recent u/s did not show any pathology - Cytology - PAP( [place]) - Cervicovaginal ancillary only( [place]) - Hepatitis B surface antigen - Hepatitis C antibody - RPR - HIV Antibody (routine testing w rflx)  3. Screen for STD (sexually transmitted disease) Pt request - Cervicovaginal ancillary only( [place]) - Hepatitis B surface antigen - Hepatitis C antibody - RPR - HIV Antibody (routine testing w rflx)  4. Papanicolaou smear Pt advised she is within 3 year window, but pt desires pap today - Cytology - PAP( [place])  Follow up in 1 year or PRN, pt should have refills on her OCP  Warden Fillers, MD Faculty Attending, Center for [facility]

## 2021-09-10 NOTE — Progress Notes (Signed)
Pt presents for stabbing low abdominal pain bilaterally x 1 month, vaginal discharge, and all STD testing.  Pt also requests to restart BCP. Lo Loesterin Fe rx sent 01/04/2021 with 1 yr refills.  Pt requests pap smear today. Normal pap 08/03/20

## 2021-09-11 LAB — HIV ANTIBODY (ROUTINE TESTING W REFLEX): HIV Screen 4th Generation wRfx: NONREACTIVE

## 2021-09-11 LAB — HEPATITIS B SURFACE ANTIGEN: Hepatitis B Surface Ag: NEGATIVE

## 2021-09-11 LAB — RPR: RPR Ser Ql: NONREACTIVE

## 2021-09-11 LAB — HEPATITIS C ANTIBODY: Hep C Virus Ab: <0.1 s/co ratio (ref 0.0–0.9)

## 2021-09-13 LAB — CERVICOVAGINAL ANCILLARY ONLY
Bacterial Vaginitis (gardnerella): POSITIVE — AB
Candida Glabrata: NEGATIVE
Candida Vaginitis: NEGATIVE
Chlamydia: POSITIVE — AB
Comment: NEGATIVE
Comment: NEGATIVE
Comment: NEGATIVE
Comment: NEGATIVE
Comment: NEGATIVE
Comment: NORMAL
Neisseria Gonorrhea: NEGATIVE
Trichomonas: NEGATIVE

## 2021-09-15 ENCOUNTER — Other Ambulatory Visit: Payer: Self-pay

## 2021-09-15 DIAGNOSIS — B9689 Other specified bacterial agents as the cause of diseases classified elsewhere: Secondary | ICD-10-CM

## 2021-09-15 DIAGNOSIS — A749 Chlamydial infection, unspecified: Secondary | ICD-10-CM

## 2021-09-15 DIAGNOSIS — N898 Other specified noninflammatory disorders of vagina: Secondary | ICD-10-CM

## 2021-09-15 LAB — CYTOLOGY - PAP: Diagnosis: NEGATIVE

## 2021-09-15 MED ORDER — FLUCONAZOLE 150 MG PO TABS
150.0000 mg | ORAL_TABLET | Freq: Once | ORAL | 0 refills | Status: AC
Start: 2021-09-15 — End: 2021-09-16

## 2021-09-15 MED ORDER — AZITHROMYCIN 500 MG PO TABS
1000.0000 mg | ORAL_TABLET | Freq: Once | ORAL | 1 refills | Status: AC
Start: 2021-09-15 — End: 2021-09-15

## 2021-09-15 MED ORDER — METRONIDAZOLE 0.75 % VA GEL: 1.0000 | Freq: Two times a day (BID) | VAGINAL | 5 refills | Status: DC

## 2021-10-08 ENCOUNTER — Other Ambulatory Visit: Payer: Self-pay

## 2021-10-08 ENCOUNTER — Encounter: Payer: Self-pay | Admitting: Obstetrics and Gynecology

## 2021-10-08 DIAGNOSIS — A749 Chlamydial infection, unspecified: Secondary | ICD-10-CM

## 2021-10-08 MED ORDER — DOXYCYCLINE HYCLATE 100 MG PO CAPS: 100.0000 mg | ORAL_CAPSULE | Freq: Two times a day (BID) | ORAL | 0 refills | Status: DC

## 2021-11-17 ENCOUNTER — Ambulatory Visit (INDEPENDENT_AMBULATORY_CARE_PROVIDER_SITE_OTHER): Payer: Medicaid Other

## 2021-11-17 ENCOUNTER — Other Ambulatory Visit (HOSPITAL_COMMUNITY)
Admission: RE | Admit: 2021-11-17 | Discharge: 2021-11-17 | Disposition: A | Discharge status: Home / Self Care | Payer: Medicaid Other | Source: Ambulatory Visit | Attending: Obstetrics and Gynecology | Admitting: Obstetrics and Gynecology

## 2021-11-17 ENCOUNTER — Other Ambulatory Visit: Payer: Self-pay

## 2021-11-17 VITALS — BP 167/92 | HR 80

## 2021-11-17 DIAGNOSIS — Z8619 Personal history of other infectious and parasitic diseases: Secondary | ICD-10-CM | POA: Diagnosis not present

## 2021-11-17 DIAGNOSIS — N898 Other specified noninflammatory disorders of vagina: Secondary | ICD-10-CM

## 2021-11-17 NOTE — Progress Notes (Cosign Needed)
Patient presents for [facility] for [facility] on 09/10/21.   SUBJECTIVE:  41 y.o. female complains of white and thin vaginal discharge. Patient states that she has had discharge since before positive Chlamydia. Patient denies having any intercourse for about 4 or 5 months. Patient also denies having any vaginal odor, irritation, or discoloration.   Denies abnormal vaginal bleeding or significant pelvic pain or fever. No UTI symptoms.   No LMP recorded.  OBJECTIVE:  She appears well, afebrile. Urine dipstick: not done.  ASSESSMENT:  Vaginal Discharge:  Vaginal Odor: Denies   PLAN:  GC, chlamydia, trichomonas, BVAG, CVAG probe sent to lab. Treatment: To be determined once lab results are received ROV prn if symptoms persist or worsen.

## 2021-11-18 LAB — CERVICOVAGINAL ANCILLARY ONLY
Bacterial Vaginitis (gardnerella): POSITIVE — AB
Candida Glabrata: NEGATIVE
Candida Vaginitis: NEGATIVE
Chlamydia: NEGATIVE
Comment: NEGATIVE
Comment: NEGATIVE
Comment: NEGATIVE
Comment: NEGATIVE
Comment: NEGATIVE
Comment: NORMAL
Neisseria Gonorrhea: NEGATIVE
Trichomonas: NEGATIVE

## 2021-11-19 ENCOUNTER — Other Ambulatory Visit: Payer: Self-pay

## 2021-11-19 MED ORDER — METRONIDAZOLE 0.75 % VA GEL: 1.0000 | Freq: Two times a day (BID) | VAGINAL | 0 refills | Status: DC

## 2021-12-15 ENCOUNTER — Ambulatory Visit: Payer: Medicaid Other

## 2022-04-21 ENCOUNTER — Ambulatory Visit (INDEPENDENT_AMBULATORY_CARE_PROVIDER_SITE_OTHER): Payer: Medicaid Other

## 2022-04-21 ENCOUNTER — Other Ambulatory Visit (HOSPITAL_COMMUNITY)
Admission: RE | Admit: 2022-04-21 | Discharge: 2022-04-21 | Disposition: A | Discharge status: Home / Self Care | Payer: Medicaid Other | Source: Ambulatory Visit | Attending: Obstetrics and Gynecology | Admitting: Obstetrics and Gynecology

## 2022-04-21 DIAGNOSIS — N898 Other specified noninflammatory disorders of vagina: Secondary | ICD-10-CM

## 2022-04-21 DIAGNOSIS — R309 Painful micturition, unspecified: Secondary | ICD-10-CM | POA: Diagnosis not present

## 2022-04-21 LAB — POCT URINALYSIS DIPSTICK
Bilirubin, UA: NEGATIVE
Blood, UA: NEGATIVE
Glucose, UA: NEGATIVE
Ketones, UA: NEGATIVE
Leukocytes, UA: NEGATIVE
Nitrite, UA: NEGATIVE
Protein, UA: NEGATIVE
Spec Grav, UA: 1.01
Urobilinogen, UA: 0.2 U/dL
pH, UA: 6

## 2022-04-21 NOTE — Progress Notes (Signed)
..  SUBJECTIVE:  41 y.o. female complains of clear vaginal discharge for 3 week(s). Denies abnormal vaginal bleeding and fever, but reports mild pelvic pain. Denies history of known exposure to STD.  No LMP recorded.  OBJECTIVE:  She appears well, afebrile. Urine dipstick: negative for all components.  ASSESSMENT:  Vaginal Discharge  Mild pelvic pain associated with urination    PLAN:  GC, chlamydia, trichomonas, BVAG, CVAG probe sent to lab. Treatment: To be determined once lab results are received ROV prn if symptoms persist or worsen. Urine culture sent

## 2022-04-22 LAB — RPR: RPR Ser Ql: NONREACTIVE

## 2022-04-22 LAB — CERVICOVAGINAL ANCILLARY ONLY
Bacterial Vaginitis (gardnerella): POSITIVE — AB
Candida Glabrata: NEGATIVE
Candida Vaginitis: NEGATIVE
Chlamydia: NEGATIVE
Comment: NEGATIVE
Comment: NEGATIVE
Comment: NEGATIVE
Comment: NEGATIVE
Comment: NEGATIVE
Comment: NORMAL
Neisseria Gonorrhea: NEGATIVE
Trichomonas: NEGATIVE

## 2022-04-22 LAB — HEPATITIS C ANTIBODY: Hep C Virus Ab: NONREACTIVE

## 2022-04-22 LAB — HIV ANTIBODY (ROUTINE TESTING W REFLEX): HIV Screen 4th Generation wRfx: NONREACTIVE

## 2022-04-22 LAB — HEPATITIS B SURFACE ANTIGEN: Hepatitis B Surface Ag: NEGATIVE

## 2022-04-23 LAB — URINE CULTURE: Organism ID, Bacteria: NO GROWTH

## 2022-04-25 MED ORDER — CLINDAMYCIN HCL 300 MG PO CAPS: 300.0000 mg | ORAL_CAPSULE | Freq: Two times a day (BID) | ORAL | 0 refills | Status: DC

## 2022-04-25 NOTE — Addendum Note (Signed)
Addended by: Catalina Antigua on: 04/25/2022 11:07 AM   Modules accepted: Orders

## 2022-04-26 ENCOUNTER — Telehealth: Payer: Self-pay | Admitting: Emergency Medicine

## 2022-04-26 NOTE — Telephone Encounter (Signed)
Attempted RC to patient who left voice message on nurse line. Unable tor reach, unable to leave voicemail.

## 2022-06-02 ENCOUNTER — Ambulatory Visit: Payer: Medicaid Other | Admitting: Emergency Medicine

## 2022-06-02 ENCOUNTER — Other Ambulatory Visit (HOSPITAL_COMMUNITY)
Admission: RE | Admit: 2022-06-02 | Discharge: 2022-06-02 | Disposition: A | Discharge status: Home / Self Care | Payer: Medicaid Other | Source: Ambulatory Visit | Attending: Obstetrics and Gynecology | Admitting: Obstetrics and Gynecology

## 2022-06-02 VITALS — BP 137/93 | HR 75 | Ht 62.4 in | Wt 138.3 lb

## 2022-06-02 DIAGNOSIS — N898 Other specified noninflammatory disorders of vagina: Secondary | ICD-10-CM | POA: Diagnosis not present

## 2022-06-02 NOTE — Progress Notes (Signed)
SUBJECTIVE:  42 y.o. female complains of grey vaginal discharge for 1 month(s). Denies abnormal vaginal bleeding or significant pelvic pain or fever. No UTI symptoms. Denies history of known exposure to STD.  LMP: 05/17/2022  OBJECTIVE:  She appears well, afebrile. Urine dipstick: not done.  ASSESSMENT:  Vaginal Discharge     PLAN:  GC, chlamydia, trichomonas, BVAG, CVAG probe sent to lab. Treatment: To be determined once lab results are received ROV prn if symptoms persist or worsen.

## 2022-06-03 ENCOUNTER — Other Ambulatory Visit: Payer: Self-pay | Admitting: Obstetrics and Gynecology

## 2022-06-03 ENCOUNTER — Encounter: Payer: Self-pay | Admitting: Obstetrics and Gynecology

## 2022-06-03 DIAGNOSIS — N898 Other specified noninflammatory disorders of vagina: Secondary | ICD-10-CM

## 2022-06-03 LAB — CERVICOVAGINAL ANCILLARY ONLY
Bacterial Vaginitis (gardnerella): POSITIVE — AB
Candida Glabrata: NEGATIVE
Candida Vaginitis: NEGATIVE
Chlamydia: NEGATIVE
Comment: NEGATIVE
Comment: NEGATIVE
Comment: NEGATIVE
Comment: NEGATIVE
Comment: NEGATIVE
Comment: NORMAL
Neisseria Gonorrhea: NEGATIVE
Trichomonas: NEGATIVE

## 2022-06-03 MED ORDER — METRONIDAZOLE 0.75 % VA GEL: 1.0000 | Freq: Two times a day (BID) | VAGINAL | 0 refills | Status: DC

## 2022-09-01 ENCOUNTER — Other Ambulatory Visit (HOSPITAL_COMMUNITY)
Admission: RE | Admit: 2022-09-01 | Discharge: 2022-09-01 | Disposition: A | Discharge status: Home / Self Care | Payer: Medicaid Other | Source: Ambulatory Visit | Attending: Obstetrics and Gynecology | Admitting: Obstetrics and Gynecology

## 2022-09-01 ENCOUNTER — Ambulatory Visit (INDEPENDENT_AMBULATORY_CARE_PROVIDER_SITE_OTHER): Payer: Medicaid Other | Admitting: *Deleted

## 2022-09-01 VITALS — BP 150/90 | HR 84

## 2022-09-01 DIAGNOSIS — N898 Other specified noninflammatory disorders of vagina: Secondary | ICD-10-CM

## 2022-09-01 DIAGNOSIS — Z113 Encounter for screening for infections with a predominantly sexual mode of transmission: Secondary | ICD-10-CM | POA: Insufficient documentation

## 2022-09-01 NOTE — Progress Notes (Signed)
SUBJECTIVE:  41 y.o. female complains of yellow vaginal discharge for 1 month(s). Denies abnormal vaginal bleeding or significant pelvic pain or fever. No UTI symptoms. Denies history of known exposure to STD.  No LMP recorded.  OBJECTIVE:  She appears well, afebrile. Urine dipstick: not done.  ASSESSMENT:  Vaginal Discharge    PLAN:  GC, chlamydia, trichomonas, BVAG, CVAG probe sent to lab. Treatment: To be determined once lab results are received ROV prn if symptoms persist or worsen.  SUBJECTIVE:  41 y.o. female who desires a STI screen. Denies abnormal vaginal discharge, bleeding or significant pelvic pain. No UTI symptoms. Denies history of known exposure to STD.  No LMP recorded.  OBJECTIVE:  She appears well.   ASSESSMENT:  STI Screen   PLAN:  Pt offered STI blood screening-requested GC, chlamydia, and trichomonas probe sent to lab.  Treatment: To be determined once lab results are received.  Pt follow up as needed.

## 2022-09-02 LAB — HIV ANTIBODY (ROUTINE TESTING W REFLEX): HIV Screen 4th Generation wRfx: NONREACTIVE

## 2022-09-02 LAB — HEPATITIS C ANTIBODY: Hep C Virus Ab: NONREACTIVE

## 2022-09-02 LAB — RPR: RPR Ser Ql: NONREACTIVE

## 2022-09-02 LAB — HEPATITIS B SURFACE ANTIGEN: Hepatitis B Surface Ag: NEGATIVE

## 2022-09-05 LAB — CERVICOVAGINAL ANCILLARY ONLY
Bacterial Vaginitis (gardnerella): POSITIVE — AB
Candida Glabrata: NEGATIVE
Candida Vaginitis: NEGATIVE
Chlamydia: NEGATIVE
Comment: NEGATIVE
Comment: NEGATIVE
Comment: NEGATIVE
Comment: NEGATIVE
Comment: NEGATIVE
Comment: NORMAL
Neisseria Gonorrhea: NEGATIVE
Trichomonas: NEGATIVE

## 2022-09-06 MED ORDER — CLINDAMYCIN HCL 300 MG PO CAPS: 300.0000 mg | ORAL_CAPSULE | Freq: Two times a day (BID) | ORAL | 0 refills | Status: DC

## 2022-09-06 NOTE — Addendum Note (Signed)
Addended by: Catalina Antigua on: 09/06/2022 01:45 PM   Modules accepted: Orders

## 2022-11-10 ENCOUNTER — Ambulatory Visit: Payer: Medicaid Other

## 2022-11-17 ENCOUNTER — Ambulatory Visit (INDEPENDENT_AMBULATORY_CARE_PROVIDER_SITE_OTHER): Payer: Medicaid Other | Admitting: Emergency Medicine

## 2022-11-17 ENCOUNTER — Other Ambulatory Visit (HOSPITAL_COMMUNITY)
Admission: RE | Admit: 2022-11-17 | Discharge: 2022-11-17 | Disposition: A | Discharge status: Home / Self Care | Payer: Medicaid Other | Source: Ambulatory Visit | Attending: Obstetrics | Admitting: Obstetrics

## 2022-11-17 VITALS — BP 145/95 | HR 90 | Ht 62.0 in | Wt 145.0 lb

## 2022-11-17 DIAGNOSIS — N898 Other specified noninflammatory disorders of vagina: Secondary | ICD-10-CM | POA: Insufficient documentation

## 2022-11-17 DIAGNOSIS — Z113 Encounter for screening for infections with a predominantly sexual mode of transmission: Secondary | ICD-10-CM | POA: Diagnosis not present

## 2022-11-17 NOTE — Progress Notes (Signed)
SUBJECTIVE:  42 y.o. female complains of bloody vaginal discharge for 3 month(s). Reports occassional vaginal bleeding and intermittent pelvic pain. No UTI symptoms. Denies history of known exposure to STD.  No LMP recorded.  OBJECTIVE:  She appears well, afebrile.   ASSESSMENT:  Vaginal Discharge  Vaginal Odor   PLAN:  GC, chlamydia, trichomonas, BVAG, CVAG probe sent to lab. Treatment: To be determined once lab results are received ROV prn if symptoms persist or worsen.

## 2022-11-18 LAB — HEPATITIS C ANTIBODY: Hep C Virus Ab: NONREACTIVE

## 2022-11-18 LAB — CERVICOVAGINAL ANCILLARY ONLY
Bacterial Vaginitis (gardnerella): POSITIVE — AB
Candida Glabrata: NEGATIVE
Candida Vaginitis: NEGATIVE
Chlamydia: NEGATIVE
Comment: NEGATIVE
Comment: NEGATIVE
Comment: NEGATIVE
Comment: NEGATIVE
Comment: NEGATIVE
Comment: NORMAL
Neisseria Gonorrhea: NEGATIVE
Trichomonas: NEGATIVE

## 2022-11-18 LAB — HIV ANTIBODY (ROUTINE TESTING W REFLEX): HIV Screen 4th Generation wRfx: NONREACTIVE

## 2022-11-18 LAB — RPR: RPR Ser Ql: NONREACTIVE

## 2022-11-18 LAB — HEPATITIS B SURFACE ANTIGEN: Hepatitis B Surface Ag: NEGATIVE

## 2022-11-21 ENCOUNTER — Other Ambulatory Visit: Payer: Self-pay

## 2022-11-21 MED ORDER — METRONIDAZOLE 0.75 % VA GEL: 1.0000 | Freq: Two times a day (BID) | VAGINAL | 0 refills | Status: DC

## 2022-11-21 NOTE — Progress Notes (Signed)
Spoke with Pt and notified her of results.  Pt requested that we send the Gel and not Tablets.  Upon entering Rx, an allergy was noted.  Made 2 nd TC to Pt and she insisted that she is fine with using the gel and is NOT allergic to Metrogel, it is what she uses.

## 2022-12-21 ENCOUNTER — Other Ambulatory Visit (HOSPITAL_COMMUNITY)
Admission: RE | Admit: 2022-12-21 | Discharge: 2022-12-21 | Disposition: A | Discharge status: Home / Self Care | Payer: Medicaid Other | Source: Ambulatory Visit | Attending: Obstetrics and Gynecology | Admitting: Obstetrics and Gynecology

## 2022-12-21 ENCOUNTER — Ambulatory Visit (INDEPENDENT_AMBULATORY_CARE_PROVIDER_SITE_OTHER): Payer: Medicaid Other

## 2022-12-21 VITALS — BP 157/100 | HR 70

## 2022-12-21 DIAGNOSIS — N898 Other specified noninflammatory disorders of vagina: Secondary | ICD-10-CM | POA: Insufficient documentation

## 2022-12-21 NOTE — Progress Notes (Signed)
SUBJECTIVE:  42 y.o. female complains of thin vaginal discharge for 5 week(s).  No UTI symptoms. Denies history of known exposure to STD.  No LMP recorded.  OBJECTIVE:  She appears well, afebrile. Urine dipstick: not done.  ASSESSMENT:  Vaginal Discharge  Irregular periods    PLAN:  GC, chlamydia, trichomonas, BVAG, CVAG probe sent to lab. Treatment: To be determined once lab results are received ROV prn if symptoms persist or worsen.  Pt advised to F/U with provider to discuss irregular periods.

## 2022-12-22 LAB — CERVICOVAGINAL ANCILLARY ONLY
Bacterial Vaginitis (gardnerella): POSITIVE — AB
Candida Glabrata: POSITIVE — AB
Candida Vaginitis: POSITIVE — AB
Chlamydia: NEGATIVE
Comment: NEGATIVE
Comment: NEGATIVE
Comment: NEGATIVE
Comment: NEGATIVE
Comment: NEGATIVE
Comment: NORMAL
Neisseria Gonorrhea: NEGATIVE
Trichomonas: NEGATIVE

## 2022-12-23 ENCOUNTER — Other Ambulatory Visit: Payer: Self-pay | Admitting: Obstetrics and Gynecology

## 2022-12-23 MED ORDER — CLINDAMYCIN HCL 300 MG PO CAPS: 300.0000 mg | ORAL_CAPSULE | Freq: Two times a day (BID) | ORAL | 0 refills | Status: DC

## 2022-12-23 MED ORDER — FLUCONAZOLE 150 MG PO TABS: 150.0000 mg | ORAL_TABLET | Freq: Once | ORAL | 0 refills | Status: AC

## 2023-02-23 ENCOUNTER — Other Ambulatory Visit: Payer: Self-pay | Admitting: Obstetrics and Gynecology

## 2023-02-24 MED ORDER — CLINDAMYCIN HCL 300 MG PO CAPS: 300.0000 mg | ORAL_CAPSULE | Freq: Two times a day (BID) | ORAL | 0 refills | Status: DC

## 2023-03-02 ENCOUNTER — Ambulatory Visit: Payer: Medicaid Other

## 2023-03-08 ENCOUNTER — Other Ambulatory Visit (HOSPITAL_COMMUNITY)
Admission: RE | Admit: 2023-03-08 | Discharge: 2023-03-08 | Disposition: A | Discharge status: Home / Self Care | Payer: Medicaid Other | Source: Ambulatory Visit | Attending: Obstetrics and Gynecology | Admitting: Obstetrics and Gynecology

## 2023-03-08 ENCOUNTER — Ambulatory Visit (INDEPENDENT_AMBULATORY_CARE_PROVIDER_SITE_OTHER): Payer: Medicaid Other | Admitting: Obstetrics and Gynecology

## 2023-03-08 VITALS — BP 154/89 | HR 76 | Wt 148.0 lb

## 2023-03-08 DIAGNOSIS — B9689 Other specified bacterial agents as the cause of diseases classified elsewhere: Secondary | ICD-10-CM | POA: Insufficient documentation

## 2023-03-08 DIAGNOSIS — Z113 Encounter for screening for infections with a predominantly sexual mode of transmission: Secondary | ICD-10-CM | POA: Diagnosis not present

## 2023-03-08 DIAGNOSIS — B3731 Acute candidiasis of vulva and vagina: Secondary | ICD-10-CM | POA: Insufficient documentation

## 2023-03-08 DIAGNOSIS — N760 Acute vaginitis: Secondary | ICD-10-CM | POA: Diagnosis not present

## 2023-03-08 DIAGNOSIS — R030 Elevated blood-pressure reading, without diagnosis of hypertension: Secondary | ICD-10-CM

## 2023-03-08 MED ORDER — METRONIDAZOLE 0.75 % VA GEL: 1.0000 | VAGINAL | 6 refills | Status: AC

## 2023-03-08 NOTE — Progress Notes (Signed)
RETURN GYNECOLOGY VISIT  Subjective:  Kathy Dean is a 42 y.o. [id number] with LMP 02/28/23 presenting for vaginal discharge  Discharge & itching x 1 week.  Has repeated episodes of BV & yeast. Has tried all lifestyle modifications, has eliminated red meat from her diet, etc. Does not notice a difference with sexual activity with or without condoms.   I personally reviewed the following: 12/21/22: +BV, candida & candida glabrata, neg GC/CT/trich 11/17/22: +BV, neg yeast, GC/CT/trich 09/01/22: +BV, neg yeast, GC/CT/trich 06/02/22: +BV, neg yeast, GC/CT/trich 04/21/22: +BV, neg yeast, GC/CT/trich  Past Medical History:  Diagnosis Date   Abnormal Pap smear    rpt was ok   Anemia    Chronic hypertension    Headache(784.0)    History of blood transfusion    Hyperemesis gravidarum    Pregnancy induced hypertension    Preterm labor    Vaginal Pap smear, abnormal    Past Surgical History:  Procedure Laterality Date   INDUCED ABORTION     NO PAST SURGERIES     Current Outpatient Medications on File Prior to Visit  Medication Sig Dispense Refill   clindamycin (CLEOCIN) 300 MG capsule Take 1 capsule (300 mg total) by mouth 2 (two) times daily. For seven days 14 capsule 0   No current facility-administered medications on file prior to visit.   Allergies  Allergen Reactions   Flagyl [Metronidazole] Swelling and Dermatitis    Patient states can take gel form.    Latex Swelling and Dermatitis   OB History     Gravida  10   Para  5   Term  5   Preterm  0   AB  5   Living  5      SAB  3   IAB  2   Ectopic      Multiple  0   Live Births  5          Social History   Socioeconomic History   Marital status: Single    Spouse name: Not on file   Number of children: Not on file   Years of education: Not on file   Highest education level: Not on file  Occupational History   Not on file  Tobacco Use   Smoking status: Never   Smokeless tobacco: Never   Vaping Use   Vaping Use: Never used  Substance and Sexual Activity   Alcohol use: Not Currently    Alcohol/week: 21.0 standard drinks of alcohol    Types: 21 Standard drinks or equivalent per week    Comment: occassionally   Drug use: Yes    Types: Other-see comments    Comment: last used 1.5 weeks ago / uses CBD oil   Sexual activity: Yes    Partners: Male    Birth control/protection: None  Other Topics Concern   Not on file  Social History Narrative   Not on file   Social Determinants of Health   Financial Resource Strain: Not on file  Food Insecurity: Not on file  Transportation Needs: Not on file  Physical Activity: Not on file  Stress: Not on file  Social Connections: Not on file  Intimate Partner Violence: Not on file   Objective:   Vitals:   03/08/23 1314 03/08/23 1320  BP: (!) 155/88 (!) 154/89  Pulse:  76  Weight: 148 lb (67.1 kg)    General:  Alert, oriented and cooperative. Patient is in no acute distress.  Skin: Skin is  warm and dry. No rash noted.   Cardiovascular: Normal heart rate noted  Respiratory: Normal respiratory effort, no problems with respiration noted  Abdomen: Soft, non-tender, non-distended   Pelvic: NEFG. Thin white discharge.   Exam performed in the presence of a chaperone  Assessment and Plan:  Britta C Trees is a 42 y.o. with recurrent BV  Recurrent bacterial vaginosis  Candida vaginitis STI screening - Cultures obtained for GC/CT/trich, BV, yeast - Discussed episodic vs extended therapy for recurrent BV. Pt desires metrogel suppression.  - Will f/u culture results and treat accordingly. Pt to start metrogel suppression after treating current symptoms.  -     Cervicovaginal ancillary only( [place]) -     HIV antibody (with reflex) -     RPR -     Hepatitis C Antibody -     Hepatitis B Surface AntiGEN -     metroNIDAZOLE (METROGEL) 0.75 % vaginal gel; Place 1 Applicatorful vaginally 2 (two) times a week for 48 doses. Apply  one applicatorful to vagina at bedtime twice weekly  Return in about 6 months (around 09/07/2023) for follow up for recurrent BV.  Lennart Pall, MD

## 2023-03-08 NOTE — Progress Notes (Signed)
Pt complains of vaginal discharge with itching/burning x 1 week. Pt would like all testing today.

## 2023-03-08 NOTE — Patient Instructions (Addendum)
We will get your results in the next 3-5 days. Once we know what we need to treat, we will send a prescription. You can start the Metrogel suppression after we get the results.

## 2023-03-09 LAB — HIV ANTIBODY (ROUTINE TESTING W REFLEX): HIV Screen 4th Generation wRfx: NONREACTIVE

## 2023-03-09 LAB — CERVICOVAGINAL ANCILLARY ONLY
Bacterial Vaginitis (gardnerella): NEGATIVE
Candida Glabrata: NEGATIVE
Candida Vaginitis: NEGATIVE
Chlamydia: NEGATIVE
Comment: NEGATIVE
Comment: NEGATIVE
Comment: NEGATIVE
Comment: NEGATIVE
Comment: NEGATIVE
Comment: NORMAL
Neisseria Gonorrhea: NEGATIVE
Trichomonas: NEGATIVE

## 2023-03-09 LAB — HEPATITIS B SURFACE ANTIGEN: Hepatitis B Surface Ag: NEGATIVE

## 2023-03-09 LAB — HEPATITIS C ANTIBODY: Hep C Virus Ab: NONREACTIVE

## 2023-03-09 LAB — RPR: RPR Ser Ql: NONREACTIVE

## 2023-03-23 IMAGING — US US PELVIS COMPLETE WITH TRANSVAGINAL
1 series · 13 of 25 positions shown · non-contrast
Comparison: 09/12/2012

CLINICAL DATA: Pelvic pain and irregular menses for 6 months,
unknown LMP



[Series 1: us pelvic complete with transvaginal · 91 acquisitions, 13 frames shown]
[im 1/91]
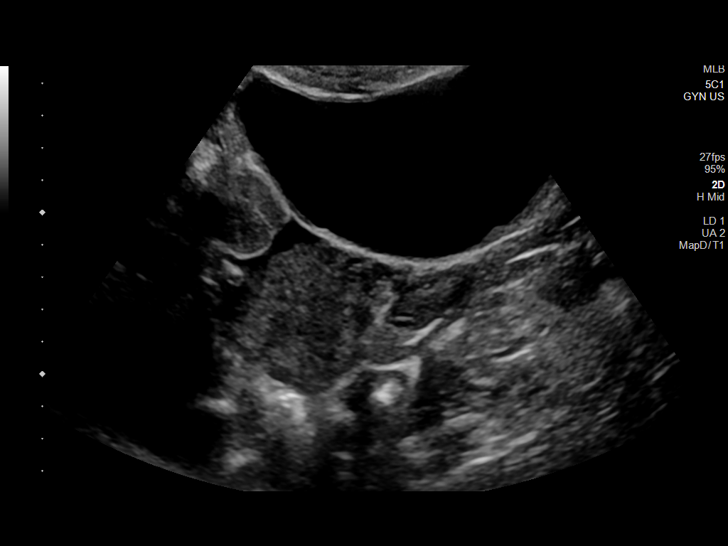
[im 8/91]
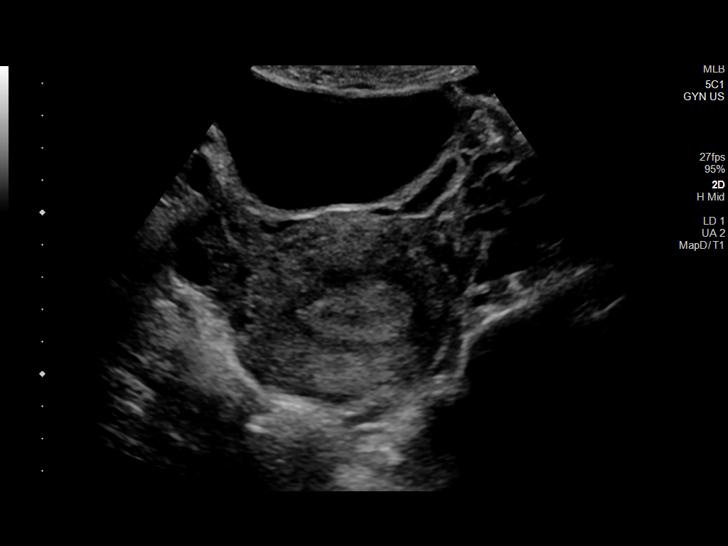
[im 16/91]
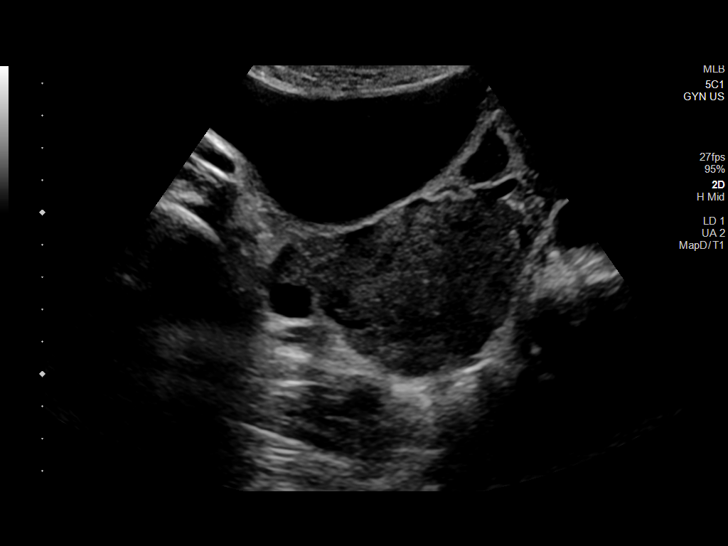
[im 23/91]
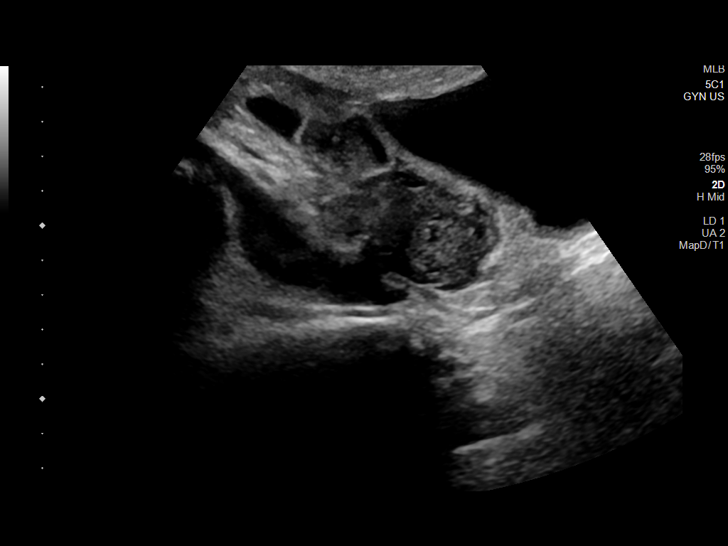
[im 31/91]
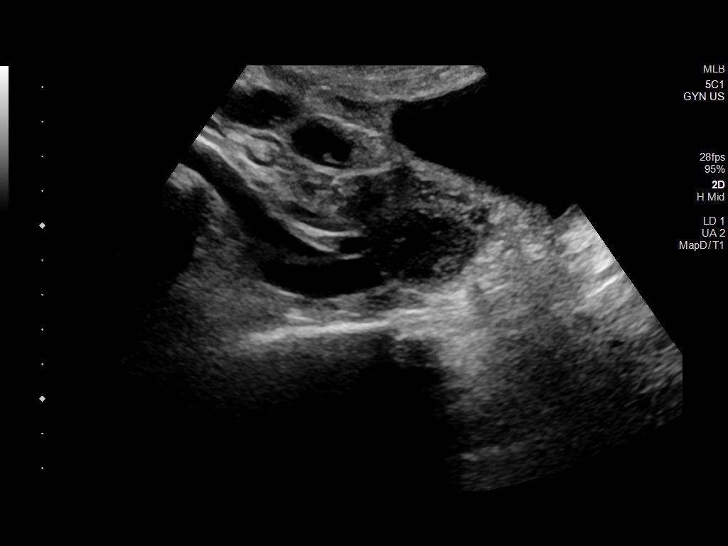
[im 38/91]
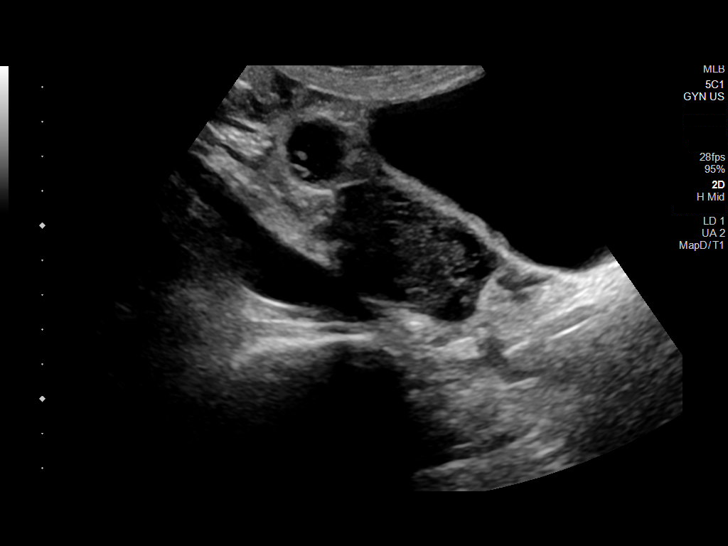
[im 46/91]
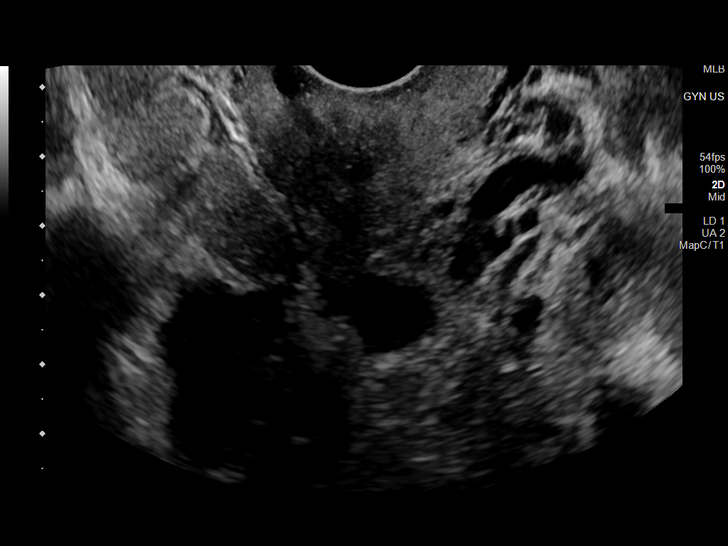
[im 53/91]
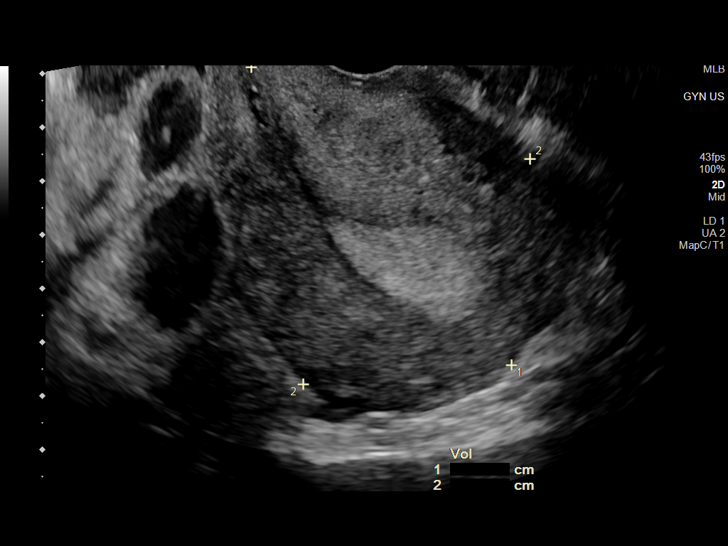
[im 61/91]
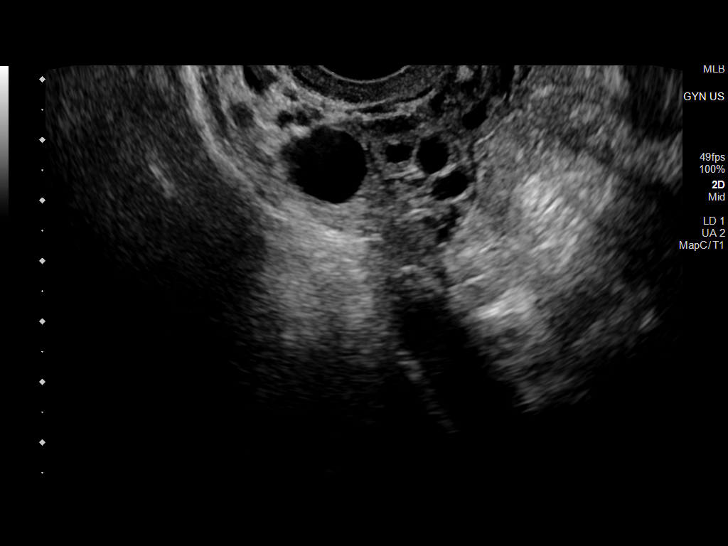
[im 68/91]
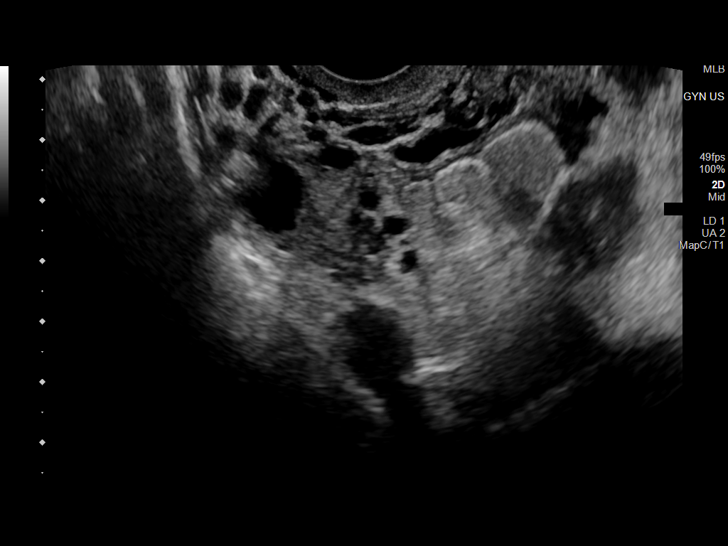
[im 76/91]
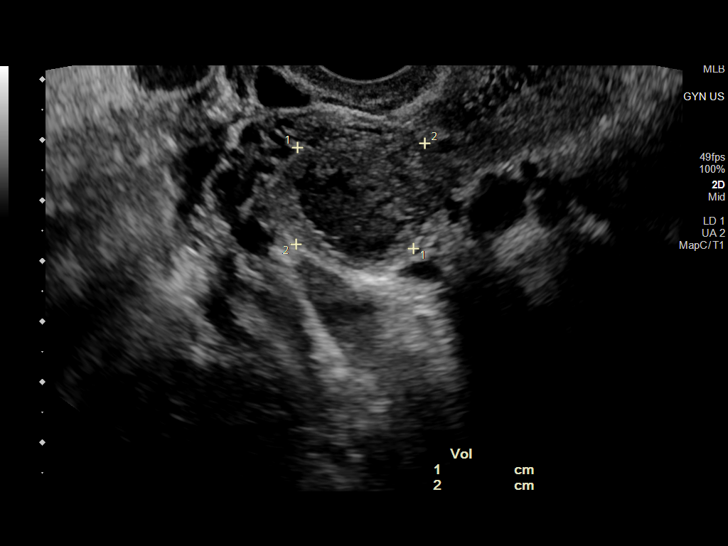
[im 83/91]
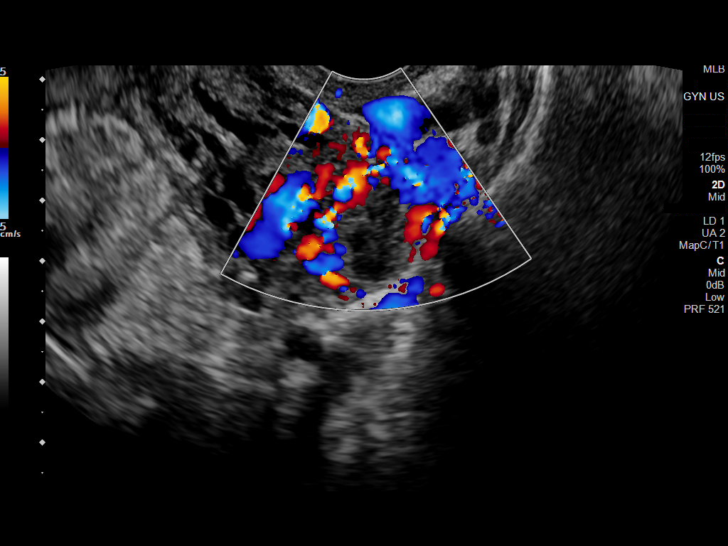
[im 91/91]
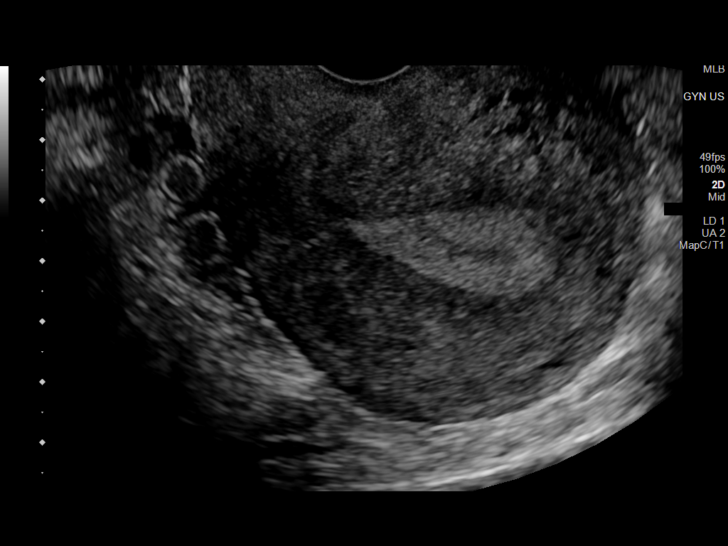

[13 of 25 positions shown; findings below may reference images not displayed]

FINDINGS: Uterus

Measurements: 7.4 x 6.0 x 6.4 cm = volume: 145 mL. Retroverted.
Normal morphology without mass

Endometrium

Thickness: 19 mm. Slightly heterogeneous without obvious mass or
fluid

Right ovary

Measurements: 2.8 x 2.0 x 2.2 cm = volume: 5.9 mL. Normal morphology
without mass

Left ovary

Measurements: 2.5 x 2.7 x 1.7 cm = volume: 6.0 mL. Small corpus
luteum; no follow-up imaging recommended. Otherwise unremarkable.

Other findings

Small amount of nonspecific free pelvic fluid.  No adnexal masses.
IMPRESSION: Thickened endometrial complex 19 mm thick; endometrial thickness is
considered abnormal. Consider follow-up by US in 6-8 weeks, during
the week immediately following menses (exam timing is critical).

Remainder of exam unremarkable.

## 2023-05-15 ENCOUNTER — Ambulatory Visit: Payer: Medicaid Other | Admitting: Emergency Medicine

## 2023-05-15 ENCOUNTER — Other Ambulatory Visit (HOSPITAL_COMMUNITY)
Admission: RE | Admit: 2023-05-15 | Discharge: 2023-05-15 | Disposition: A | Discharge status: Home / Self Care | Payer: Medicaid Other | Source: Ambulatory Visit | Attending: Obstetrics & Gynecology | Admitting: Obstetrics & Gynecology

## 2023-05-15 VITALS — BP 133/87 | HR 80 | Wt 143.2 lb

## 2023-05-15 DIAGNOSIS — N898 Other specified noninflammatory disorders of vagina: Secondary | ICD-10-CM

## 2023-05-15 NOTE — Progress Notes (Signed)
SUBJECTIVE:  42 y.o. female complains of white vaginal discharge and vaginal odor several month(s). Denies abnormal vaginal bleeding or significant pelvic pain or fever. No UTI symptoms. Denies history of known exposure to STD.  No LMP recorded.  OBJECTIVE:  She appears well, afebrile. Urine dipstick: not done.  ASSESSMENT:  Vaginal Discharge  Vaginal Odor   PLAN:  GC, chlamydia, trichomonas, BVAG, CVAG probe sent to lab. Treatment: To be determined once lab results are received ROV prn if symptoms persist or worsen.

## 2024-03-07 ENCOUNTER — Other Ambulatory Visit (HOSPITAL_COMMUNITY)
Admission: RE | Admit: 2024-03-07 | Discharge: 2024-03-07 | Disposition: A | Discharge status: Home / Self Care | Source: Ambulatory Visit | Attending: Obstetrics & Gynecology | Admitting: Obstetrics & Gynecology

## 2024-03-07 ENCOUNTER — Ambulatory Visit

## 2024-03-07 VITALS — BP 139/90 | HR 83

## 2024-03-07 DIAGNOSIS — N898 Other specified noninflammatory disorders of vagina: Secondary | ICD-10-CM | POA: Insufficient documentation

## 2024-03-07 NOTE — Progress Notes (Signed)
 SUBJECTIVE:  43 y.o. female complains of white vaginal discharge for 2 month(s). Denies abnormal vaginal bleeding or significant pelvic pain or fever. No UTI symptoms. Denies history of known exposure to STD.  No LMP recorded.  OBJECTIVE:  She appears well, afebrile. Urine dipstick: not done.  ASSESSMENT:  Vaginal Discharge  Vaginal Odor   PLAN:  GC, chlamydia, trichomonas, BVAG, CVAG probe sent to lab. Treatment: To be determined once lab results are received ROV prn if symptoms persist or worsen.

## 2024-03-08 LAB — CERVICOVAGINAL ANCILLARY ONLY
Bacterial Vaginitis (gardnerella): NEGATIVE
Candida Glabrata: NEGATIVE
Candida Vaginitis: NEGATIVE
Chlamydia: NEGATIVE
Comment: NEGATIVE
Comment: NEGATIVE
Comment: NEGATIVE
Comment: NEGATIVE
Comment: NEGATIVE
Comment: NORMAL
Neisseria Gonorrhea: NEGATIVE
Trichomonas: NEGATIVE

## 2024-07-12 ENCOUNTER — Ambulatory Visit: Admitting: Obstetrics

## 2024-07-12 ENCOUNTER — Other Ambulatory Visit (HOSPITAL_COMMUNITY)
Admission: RE | Admit: 2024-07-12 | Discharge: 2024-07-12 | Disposition: A | Discharge status: Home / Self Care | Source: Ambulatory Visit | Attending: Obstetrics | Admitting: Obstetrics

## 2024-07-12 ENCOUNTER — Encounter: Payer: Self-pay | Admitting: Obstetrics

## 2024-07-12 VITALS — BP 149/89 | HR 77 | Ht 62.0 in | Wt 159.2 lb

## 2024-07-12 DIAGNOSIS — Z1239 Encounter for other screening for malignant neoplasm of breast: Secondary | ICD-10-CM

## 2024-07-12 DIAGNOSIS — R102 Pelvic and perineal pain unspecified side: Secondary | ICD-10-CM | POA: Diagnosis not present

## 2024-07-12 DIAGNOSIS — Z01419 Encounter for gynecological examination (general) (routine) without abnormal findings: Secondary | ICD-10-CM | POA: Insufficient documentation

## 2024-07-12 DIAGNOSIS — N898 Other specified noninflammatory disorders of vagina: Secondary | ICD-10-CM | POA: Insufficient documentation

## 2024-07-12 LAB — POCT URINALYSIS DIPSTICK
Bilirubin, UA: NEGATIVE
Blood, UA: NEGATIVE
Glucose, UA: NEGATIVE
Ketones, UA: NEGATIVE
Leukocytes, UA: NEGATIVE
Nitrite, UA: NEGATIVE
Protein, UA: NEGATIVE
Spec Grav, UA: 1.015 (ref 1.010–1.025)
Urobilinogen, UA: 0.2 U/dL
pH, UA: 6 (ref 5.0–8.0)

## 2024-07-12 MED ORDER — IBUPROFEN 800 MG PO TABS: 800.0000 mg | ORAL_TABLET | Freq: Three times a day (TID) | ORAL | 5 refills | Status: AC | PRN

## 2024-07-12 NOTE — Progress Notes (Signed)
 Subjective:        Kathy Dean is a 43 y.o. female here for a routine exam.  Current complaints: Pelvic pain and vaginal discharge.    Personal health questionnaire:  Is patient Ashkenazi Jewish, have a family history of breast and/or ovarian cancer: no Is there a family history of uterine cancer diagnosed at age < 52, gastrointestinal cancer, urinary tract cancer, family member who is a Personnel officer syndrome-associated carrier: no Is the patient overweight and hypertensive, family history of diabetes, personal history of gestational diabetes, preeclampsia or PCOS: no Is patient over 34, have PCOS,  family history of premature CHD under age 27, diabetes, smoke, have hypertension or peripheral artery disease:  no At any time, has a partner hit, kicked or otherwise hurt or frightened you?: no Over the past 2 weeks, have you felt down, depressed or hopeless?: no Over the past 2 weeks, have you felt little interest or pleasure in doing things?:no   Gynecologic History Patient's last menstrual period was 06/30/2024 (exact date). Contraception: condoms Last Pap: 2022. Results were: normal Last mammogram: none. Results were: none  Obstetric History OB History  Gravida Para Term Preterm AB Living  10 5 5  0 5 5  SAB IAB Ectopic Multiple Live Births  3 2  0 5    # Outcome Date GA Lbr Len/2nd Weight Sex Type Anes PTL Lv  10 SAB 04/2020          9 Term 12/28/18 [redacted]w[redacted]d / 00:06 6 lb 6.8 oz (2.914 kg) F Vag-Spont EPI  LIV  8 Term 04/24/14 [redacted]w[redacted]d 17:30 / 00:40 7 lb 4.8 oz (3.31 kg) M Vag-Spont EPI  LIV  7 Term 07/24/06 [redacted]w[redacted]d  6 lb 2 oz (2.778 kg)  Vag-Spont   LIV  6 Term 08/05/98 110w0d  6 lb (2.722 kg)  Vag-Spont   LIV     Complications: Anemia affecting second pregnancy  5 Term 09/08/96 [redacted]w[redacted]d  7 lb 10 oz (3.459 kg)  Vag-Spont   LIV  4 SAB           3 SAB           2 IAB           1 IAB             Past Medical History:  Diagnosis Date   Abnormal Pap smear    rpt was ok   Anemia     Chronic hypertension    Headache(784.0)    History of blood transfusion    Hyperemesis gravidarum    Pregnancy induced hypertension    Preterm labor    Vaginal Pap smear, abnormal     Past Surgical History:  Procedure Laterality Date   INDUCED ABORTION     NO PAST SURGERIES       Current Outpatient Medications:    BLACK CURRANT SEED OIL PO, Take by mouth., Disp: , Rfl:    clove oil liquid, Apply topically as needed., Disp: , Rfl:    OIL OF OREGANO PO, Take by mouth., Disp: , Rfl:  Allergies  Allergen Reactions   Flagyl  [Metronidazole ] Swelling and Dermatitis    Patient states can take gel form.    Latex Swelling and Dermatitis    Social History   Tobacco Use   Smoking status: Never   Smokeless tobacco: Never  Substance Use Topics   Alcohol use: Not Currently    Alcohol/week: 21.0 standard drinks of alcohol    Types: 21 Standard drinks  or equivalent per week    Comment: occassionally    Family History  Problem Relation Age of Onset   Stroke Maternal Grandmother    Cancer Maternal Grandfather    Hypertension Mother    Diabetes Mother    Hypertension Father    Other Neg Hx       Review of Systems  Constitutional: negative for fatigue and weight loss Respiratory: negative for cough and wheezing Cardiovascular: negative for chest pain, fatigue and palpitations Gastrointestinal: negative for abdominal pain and change in bowel habits Musculoskeletal:negative for myalgias Neurological: negative for gait problems and tremors Behavioral/Psych: negative for abusive relationship, depression Endocrine: negative for temperature intolerance    Genitourinary: positive for vaginal discharge and pelvic pain.  negative for abnormal menstrual periods, genital lesions, hot flashes, sexual problems  Integument/breast: negative for breast lump, breast tenderness, nipple discharge and skin lesion(s)    Objective:       BP (!) 149/89   Pulse 77   Ht 5' 2 (1.575 m)   Wt 159  lb 3.2 oz (72.2 kg)   LMP 06/30/2024 (Exact Date)   BMI 29.12 kg/m  General:   Alert and no distress  Skin:   no rash or abnormalities  Lungs:   clear to auscultation bilaterally  Heart:   regular rate and rhythm, S1, S2 normal, no murmur, click, rub or gallop  Breasts:   normal without suspicious masses, skin or nipple changes or axillary nodes  Abdomen:  normal findings: no organomegaly, soft, non-tender and no hernia  Pelvis:  External genitalia: normal general appearance Urinary system: urethral meatus normal and bladder without fullness, nontender Vaginal: normal without tenderness, induration or masses Cervix: normal appearance Adnexa: normal bimanual exam Uterus: anteverted and non-tender, normal size   Lab Review Urine pregnancy test Labs reviewed yes Radiologic studies reviewed yes  I have spent a total of 20 minutes of face-to-face time, excluding clinical staff time, reviewing notes and preparing to see patient, ordering tests and/or medications, and counseling the patient.   Assessment:    1. Encounter for gynecological examination with Papanicolaou smear of cervix (Primary) Rx: - Cytology - PAP( [facility])  2. Vaginal discharge Rx: - Cervicovaginal ancillary only - HIV Antibody (routine testing w rflx) - Hepatitis B surface antigen - RPR - Hepatitis C antibody  3. Pelvic pain Rx: - POCT Urinalysis Dipstick - US  PELVIC COMPLETE WITH TRANSVAGINAL; Future  4. Screening breast examination Rx: - MM 3D SCREENING MAMMOGRAM BILATERAL BREAST; Future     Plan:    Education reviewed: calcium supplements, depression evaluation, low fat, low cholesterol diet, safe sex/STD prevention, self breast exams, and weight bearing exercise. Contraception: condoms. Mammogram ordered. Follow up in: 2 weeks.    Orders Placed This Encounter  Procedures   MM 3D SCREENING MAMMOGRAM BILATERAL BREAST    Standing Status:   Future    Expiration Date:   07/12/2025    Reason  for Exam (SYMPTOM  OR DIAGNOSIS REQUIRED):   Needs routine screening    Is the patient pregnant?:   No    Preferred imaging location?:   [facility]   US  PELVIC COMPLETE WITH TRANSVAGINAL    Standing Status:   Future    Expiration Date:   07/12/2025    Reason for Exam (SYMPTOM  OR DIAGNOSIS REQUIRED):   PELVIC PAIN    Preferred imaging location?:   [facility]   HIV Antibody (routine testing w rflx)   Hepatitis B surface antigen   RPR  Hepatitis C antibody   POCT Urinalysis Dipstick     CARLIN RONAL CENTERS MD, FACOG Attending Obstetrician & Gynecologist, [facility] for [facility], [facility], [place] 07/12/2024

## 2024-07-12 NOTE — Progress Notes (Signed)
 GYN. Abdominal pain for ~2 weeks. Requesting swab. Malodorous urine.

## 2024-07-13 LAB — RPR: RPR Ser Ql: NONREACTIVE

## 2024-07-13 LAB — HIV ANTIBODY (ROUTINE TESTING W REFLEX): HIV Screen 4th Generation wRfx: NONREACTIVE

## 2024-07-13 LAB — HEPATITIS B SURFACE ANTIGEN: Hepatitis B Surface Ag: NEGATIVE

## 2024-07-13 LAB — HEPATITIS C ANTIBODY: Hep C Virus Ab: NONREACTIVE

## 2024-07-15 ENCOUNTER — Ambulatory Visit

## 2024-07-15 LAB — CERVICOVAGINAL ANCILLARY ONLY
Bacterial Vaginitis (gardnerella): POSITIVE — AB
Candida Glabrata: NEGATIVE
Candida Vaginitis: NEGATIVE
Chlamydia: NEGATIVE
Comment: NEGATIVE
Comment: NEGATIVE
Comment: NEGATIVE
Comment: NEGATIVE
Comment: NEGATIVE
Comment: NORMAL
Neisseria Gonorrhea: NEGATIVE
Trichomonas: NEGATIVE

## 2024-07-16 ENCOUNTER — Ambulatory Visit: Payer: Self-pay | Admitting: Obstetrics

## 2024-07-16 DIAGNOSIS — B9689 Other specified bacterial agents as the cause of diseases classified elsewhere: Secondary | ICD-10-CM

## 2024-07-16 LAB — CYTOLOGY - PAP
Comment: NEGATIVE
Comment: NEGATIVE
Comment: NEGATIVE
Diagnosis: NEGATIVE
HPV 16: NEGATIVE
HPV 18 / 45: NEGATIVE
High risk HPV: POSITIVE — AB

## 2024-07-16 MED ORDER — CLINDAMYCIN HCL 300 MG PO CAPS
300.0000 mg | ORAL_CAPSULE | Freq: Three times a day (TID) | ORAL | 0 refills | Status: AC
Start: 2024-07-16 — End: ?

## 2024-07-17 ENCOUNTER — Telehealth: Payer: Self-pay

## 2024-07-17 MED ORDER — FLUCONAZOLE 150 MG PO TABS
150.0000 mg | ORAL_TABLET | Freq: Once | ORAL | 0 refills | Status: AC
Start: 2024-07-17 — End: 2024-07-17

## 2024-07-17 NOTE — Telephone Encounter (Signed)
 Returned call, advised of results and answered questions. Rx sent for yeast per protocol after taking antibiotics per pt request.

## 2024-07-18 ENCOUNTER — Other Ambulatory Visit

## 2024-07-24 ENCOUNTER — Other Ambulatory Visit: Payer: Self-pay | Admitting: Obstetrics

## 2024-10-03 ENCOUNTER — Other Ambulatory Visit: Payer: Self-pay | Admitting: Obstetrics and Gynecology
